# Patient Record
Sex: Female | Born: 1935
Health system: Southern US, Community
[De-identification: ages and names within clinical notes are randomized; demographics above are authoritative.]

## PROBLEM LIST (undated history)

## (undated) DIAGNOSIS — E119 Type 2 diabetes mellitus without complications: Secondary | ICD-10-CM

## (undated) DIAGNOSIS — R9431 Abnormal electrocardiogram [ECG] [EKG]: Secondary | ICD-10-CM

## (undated) DIAGNOSIS — I4891 Unspecified atrial fibrillation: Secondary | ICD-10-CM

## (undated) DIAGNOSIS — I10 Essential (primary) hypertension: Secondary | ICD-10-CM

## (undated) DIAGNOSIS — E039 Hypothyroidism, unspecified: Secondary | ICD-10-CM

## (undated) HISTORY — DX: Type 2 diabetes mellitus without complications: E11.9

## (undated) HISTORY — DX: Hypothyroidism, unspecified: E03.9

## (undated) HISTORY — DX: Essential (primary) hypertension: I10

## (undated) HISTORY — PX: CATARACT EXTRACTION: SUR2

## (undated) HISTORY — DX: Abnormal electrocardiogram (ECG) (EKG): R94.31

## (undated) HISTORY — PX: KNEE SURGERY: SHX244

## (undated) HISTORY — PX: ABDOMINAL HYSTERECTOMY: SHX81

---

## 2007-09-28 ENCOUNTER — Ambulatory Visit: Payer: Self-pay | Admitting: Gastroenterology

## 2007-12-29 ENCOUNTER — Ambulatory Visit: Payer: Self-pay | Admitting: Gastroenterology

## 2008-01-11 ENCOUNTER — Ambulatory Visit: Payer: Self-pay | Admitting: Gastroenterology

## 2008-08-14 ENCOUNTER — Emergency Department (HOSPITAL_COMMUNITY): Admission: EM | Admit: 2008-08-14 | Discharge: 2008-08-14 | Payer: Self-pay | Admitting: Emergency Medicine

## 2010-05-09 ENCOUNTER — Ambulatory Visit: Payer: Self-pay | Admitting: Cardiovascular Disease

## 2010-05-21 ENCOUNTER — Encounter: Payer: Self-pay | Admitting: Cardiovascular Disease

## 2010-05-21 ENCOUNTER — Ambulatory Visit: Payer: Self-pay

## 2010-05-21 ENCOUNTER — Ambulatory Visit (HOSPITAL_COMMUNITY)
Admission: RE | Admit: 2010-05-21 | Discharge: 2010-05-21 | Payer: Self-pay | Source: Home / Self Care | Attending: Cardiovascular Disease | Admitting: Cardiovascular Disease

## 2011-03-08 LAB — GLUCOSE, CAPILLARY
Glucose-Capillary: 127 — ABNORMAL HIGH
Glucose-Capillary: 140 — ABNORMAL HIGH

## 2011-11-09 ENCOUNTER — Encounter: Payer: Self-pay | Admitting: *Deleted

## 2012-01-16 ENCOUNTER — Encounter: Payer: Self-pay | Admitting: Gastroenterology

## 2012-02-18 ENCOUNTER — Ambulatory Visit: Payer: Self-pay | Admitting: Gastroenterology

## 2012-03-16 ENCOUNTER — Ambulatory Visit: Payer: Self-pay | Admitting: Gastroenterology

## 2012-11-25 ENCOUNTER — Encounter: Payer: Self-pay | Admitting: Gastroenterology

## 2013-11-07 ENCOUNTER — Encounter: Payer: Self-pay | Admitting: Gastroenterology

## 2015-04-03 ENCOUNTER — Encounter: Payer: Self-pay | Admitting: Gastroenterology

## 2018-11-11 ENCOUNTER — Encounter: Payer: Self-pay | Admitting: Gastroenterology

## 2020-03-02 ENCOUNTER — Other Ambulatory Visit: Payer: Self-pay

## 2020-03-02 ENCOUNTER — Other Ambulatory Visit (HOSPITAL_COMMUNITY)
Admission: RE | Admit: 2020-03-02 | Discharge: 2020-03-02 | Disposition: A | Discharge status: Home / Self Care | Payer: Medicare HMO | Source: Ambulatory Visit | Attending: Cardiology | Admitting: Cardiology

## 2020-03-02 ENCOUNTER — Encounter: Payer: Self-pay | Admitting: Cardiology

## 2020-03-02 ENCOUNTER — Ambulatory Visit (INDEPENDENT_AMBULATORY_CARE_PROVIDER_SITE_OTHER): Payer: Medicare HMO | Admitting: Cardiology

## 2020-03-02 VITALS — BP 172/90 | HR 68 | Ht 62.0 in | Wt 154.0 lb

## 2020-03-02 DIAGNOSIS — I2089 Other forms of angina pectoris: Secondary | ICD-10-CM

## 2020-03-02 DIAGNOSIS — R0609 Other forms of dyspnea: Secondary | ICD-10-CM

## 2020-03-02 DIAGNOSIS — Z20822 Contact with and (suspected) exposure to covid-19: Secondary | ICD-10-CM | POA: Diagnosis not present

## 2020-03-02 DIAGNOSIS — I10 Essential (primary) hypertension: Secondary | ICD-10-CM

## 2020-03-02 DIAGNOSIS — E785 Hyperlipidemia, unspecified: Secondary | ICD-10-CM

## 2020-03-02 DIAGNOSIS — Z01812 Encounter for preprocedural laboratory examination: Secondary | ICD-10-CM | POA: Diagnosis present

## 2020-03-02 DIAGNOSIS — Z7189 Other specified counseling: Secondary | ICD-10-CM

## 2020-03-02 DIAGNOSIS — I208 Other forms of angina pectoris: Secondary | ICD-10-CM

## 2020-03-02 DIAGNOSIS — R072 Precordial pain: Secondary | ICD-10-CM

## 2020-03-02 DIAGNOSIS — R0600 Dyspnea, unspecified: Secondary | ICD-10-CM

## 2020-03-02 DIAGNOSIS — E119 Type 2 diabetes mellitus without complications: Secondary | ICD-10-CM

## 2020-03-02 LAB — CBC
Hematocrit: 38.7 % (ref 34.0–46.6)
Hemoglobin: 12.5 g/dL (ref 11.1–15.9)
MCH: 31.3 pg (ref 26.6–33.0)
MCHC: 32.3 g/dL (ref 31.5–35.7)
MCV: 97 fL (ref 79–97)
Platelets: 234 10*3/uL (ref 150–450)
RBC: 4 x10E6/uL (ref 3.77–5.28)
RDW: 13.5 % (ref 11.7–15.4)
WBC: 9.2 10*3/uL (ref 3.4–10.8)

## 2020-03-02 LAB — BASIC METABOLIC PANEL
BUN/Creatinine Ratio: 15 (ref 12–28)
BUN: 14 mg/dL (ref 8–27)
CO2: 22 mmol/L (ref 20–29)
Calcium: 9.3 mg/dL (ref 8.7–10.3)
Chloride: 105 mmol/L (ref 96–106)
Creatinine, Ser: 0.91 mg/dL (ref 0.57–1.00)
GFR calc Af Amer: 67 mL/min/{1.73_m2} (ref >59)
GFR calc non Af Amer: 58 mL/min/{1.73_m2} — ABNORMAL LOW (ref >59)
Glucose: 149 mg/dL — ABNORMAL HIGH (ref 65–99)
Potassium: 5.3 mmol/L — ABNORMAL HIGH (ref 3.5–5.2)
Sodium: 143 mmol/L (ref 134–144)

## 2020-03-02 LAB — SARS CORONAVIRUS 2 (TAT 6-24 HRS): SARS Coronavirus 2: NEGATIVE

## 2020-03-02 MED ORDER — NITROGLYCERIN 0.4 MG SL SUBL: 0.4000 mg | SUBLINGUAL_TABLET | SUBLINGUAL | 3 refills | Status: AC | PRN

## 2020-03-02 NOTE — Patient Instructions (Addendum)
Medication Instructions:  Nitroglycerin: Place 1 tablet (0.4 mg total) under the tongue every 5 (five) minutes as needed for chest pain as directed.   *If you need a refill on your cardiac medications before your next appointment, please call your pharmacy*   Lab Work: Your physician recommends that you return for lab work today ( CBC, BMP, Lipid)  If you have labs (blood work) drawn today and your tests are completely normal, you will receive your results only by: Marland Kitchen MyChart Message (if you have MyChart) OR . A paper copy in the mail If you have any lab test that is abnormal or we need to change your treatment, we will call you to review the results.   Testing/Procedures: Your physician has requested that you have a cardiac catheterization. Cardiac catheterization is used to diagnose and/or treat various heart conditions. Doctors may recommend this procedure for a number of different reasons. The most common reason is to evaluate chest pain. Chest pain can be a symptom of coronary artery disease (CAD), and cardiac catheterization can show whether plaque is narrowing or blocking your heart's arteries. This procedure is also used to evaluate the valves, as well as measure the blood flow and oxygen levels in different parts of your heart. For further information please visit [email or web address] Please follow instruction sheet, as given. [facility]  You will need to have the coronavirus test completed prior to your procedure. An appointment has been made at 12 pm  on Thursday 03/02/20. This is a Drive Up Visit at [address] [place], [place], [place] [address]. Please tell them that you are there for procedure testing. Stay in your car and someone will be with you shortly. Please make sure to have all other labs completed before this test because you will need to stay quarantined until your procedure.   Follow-Up: At [facility], you and your health needs are our priority.  As part of  our continuing mission to provide you with exceptional heart care, we have created designated Provider Care Teams.  These Care Teams include your primary Cardiologist (physician) and Advanced Practice Providers (APPs -  Physician Assistants and Nurse Practitioners) who all work together to provide you with the care you need, when you need it.  We recommend signing up for the patient portal called "MyChart".  Sign up information is provided on this After Visit Summary.  MyChart is used to connect with patients for Virtual Visits (Telemedicine).  Patients are able to view lab/test results, encounter notes, upcoming appointments, etc.  Non-urgent messages can be sent to your provider as well.   To learn more about what you can do with MyChart, go to [email or web address].    Your next appointment:   2 week(s)  The format for your next appointment:   In Person  Provider:   Jodelle Red, MD     [facility] CARDIOVASCULAR DIVISION [facility] [address] [place] [address] [place] [address] Dept: [phone] Loc: [phone]  Fusae Florio  03/02/2020  You are scheduled for a Cardiac Catheterization on Monday, September 27 with Dr. Peter Swaziland.  1. Please arrive at the [facility] (Main Entrance A) at [facility]: [address] [place], [place] [address] at 5:30 AM (This time is two hours before your procedure to ensure your preparation). Free valet parking service is available.   Special note: Every effort is made to have your procedure done on time. Please understand that emergencies sometimes delay scheduled  procedures.  2. Diet: Do not eat solid foods after midnight.  The patient may have clear liquids until 5am upon the day of the procedure.  3. Labs: You will need to have blood drawn today ( CBC, BMP)  4. Medication instructions in preparation for your procedure:   Contrast Allergy: No  Do not take Diabetes Med  Glucophage (Metformin) on the day of the procedure and HOLD 48 HOURS AFTER THE PROCEDURE.  On the morning of your procedure, take your Aspirin and any morning medicines NOT listed above.  You may use sips of water.  5. Plan for one night stay--bring personal belongings. 6. Bring a current list of your medications and current insurance cards. 7. You MUST have a responsible person to drive you home. 8. Someone MUST be with you the first 24 hours after you arrive home or your discharge will be delayed. 9. Please wear clothes that are easy to get on and off and wear slip-on shoes.  Thank you for allowing [place] to care for you!   -- [place] [facility]

## 2020-03-02 NOTE — H&P (View-Only) (Signed)
Cardiology Office Note:    Date:  03/02/2020   ID:  Cheryl Mora, DOB 09/24/1932, MRN [id number]  PCP:  Badger, Michael C, MD  Cardiologist:  Oumar Marcott, MD  Referring MD: Badger, Michael C, MD   CC: new patient evaluation for dyspnea on exertion  History of Present Illness:    Cheryl Mora is a 87 y.o. female with a hx of type II diabetes, hyperlipidemia, hypertension, hypothyroidism who is seen as a new consult at the request of Badger, Michael C, MD for the evaluation and management of dyspnea on exertion.  Notes received and reviewed from appt with Dr. Badger on 01/24/20. Noted gradually worsening dyspnea on exertion at that visit.  Today: Here with daughter Cheryl Mora today.  Feels short of breath even walking up a short incline carrying a 5 lbs bag of potatoes. Has progressed signficantly in the last 1-2 weeks. Was short of breath while shopping as well. No nausea, diaphoresis, but does feel lightheaded with events. No chest pain until last night, felt like an elephant was sitting on her chest, felt pressure in her upper chest as well.   Walks 2-4,000 steps/day on her fitbit. Notes that in the same last 1-2 weeks her heart rate can be elevated to the 120s, now back to normal.   Gets a pain in her right shoulder and neck as well, has arthritis in this shoulder and using Salonpas.  Labs reviewed   FH: mother and maternal had heart disease. Father's side is unknown  Former smoker, very heavy until 30-40 years ago.   Already taking aspirin, atenolol, pravastatin. Tolerating these.   Denies PND, orthopnea, LE edema or unexpected weight gain. No syncope or palpitations.  Past Medical History:  Diagnosis Date  . Abnormal EKG    suggestive of previous anterior wall myocardial infarction  . Diabetes mellitus (HCC)   . Hypertension   . Hypothyroidism     Past Surgical History:  Procedure Laterality Date  . ABDOMINAL HYSTERECTOMY    . CATARACT EXTRACTION    . KNEE  SURGERY      Current Medications: Current Outpatient Medications on File Prior to Visit  Medication Sig  . amLODipine (NORVASC) 2.5 MG tablet Take 2.5 mg by mouth daily.   . aspirin 81 MG tablet Take 81 mg by mouth daily.  . atenolol (TENORMIN) 50 MG tablet Take 50 mg by mouth daily.  . diclofenac (VOLTAREN) 75 MG EC tablet Take 75 mg by mouth 2 (two) times daily.  . fluticasone (FLONASE) 50 MCG/ACT nasal spray Place 2 sprays into the nose daily.  . levothyroxine (SYNTHROID, LEVOTHROID) 88 MCG tablet Take 88 mcg by mouth daily.  . metFORMIN (GLUCOPHAGE) 500 MG tablet Take 500 mg by mouth 2 (two) times daily with a meal.  . pravastatin (PRAVACHOL) 40 MG tablet Take 40 mg by mouth daily.   No current facility-administered medications on file prior to visit.     Allergies:   Amoxicillin-pot clavulanate, Brimonidine, Lisinopril, Zolpidem tartrate, Brimonidine tartrate-timolol, Other, Zolpidem, and Zolpidem tartrate   Social History   Tobacco Use  . Smoking status: Former Smoker  . Smokeless tobacco: Never Used  Substance Use Topics  . Alcohol use: Yes    Comment: several a day  . Drug use: Not on file    Family History: family history includes Cirrhosis in her mother; Heart attack in her unknown relative; Hypertension in her mother; Stroke in her mother.  ROS:   Please see the history of present illness.    Additional pertinent ROS: Constitutional: Negative for chills, fever, night sweats, unintentional weight loss  HENT: Negative for ear pain and hearing loss.   Eyes: Negative for loss of vision and eye pain.  Respiratory: Negative for cough, sputum, wheezing.   Cardiovascular: See HPI. Gastrointestinal: Negative for abdominal pain, melena, and hematochezia.  Genitourinary: Negative for dysuria and hematuria.  Musculoskeletal: Negative for falls and myalgias.  Skin: Negative for itching and rash.  Neurological: Negative for focal weakness, focal sensory changes and loss of  consciousness.  Endo/Heme/Allergies: Does not bruise/bleed easily.     EKGs/Labs/Other Studies Reviewed:    The following studies were reviewed today: No prior cardiac studies.  EKG:  EKG is personally reviewed.  The ekg ordered today demonstrates SR at 68 bpm, PACs, PRWP, TWI V1-V4  Recent Labs: No results found for requested labs within last 8760 hours.  Recent Lipid Panel No results found for: CHOL, TRIG, HDL, CHOLHDL, VLDL, LDLCALC, LDLDIRECT  Physical Exam:    VS:  BP (!) 172/90   Pulse 68   Ht 5\' 2"  (1.575 m)   Wt 154 lb (69.9 kg)   BMI 28.17 kg/m     Wt Readings from Last 3 Encounters:  03/02/20 154 lb (69.9 kg)    GEN: Well nourished, well developed in no acute distress HEENT: Normal, moist mucous membranes NECK: No JVD CARDIAC: regular rhythm, normal S1 and S2, no rubs or gallops. No murmurs. VASCULAR: Radial and DP pulses 2+ bilaterally. No carotid bruits RESPIRATORY:  Clear to auscultation without rales, wheezing or rhonchi  ABDOMEN: Soft, non-tender, non-distended MUSCULOSKELETAL:  Ambulates independently SKIN: Warm and dry, no edema NEUROLOGIC:  Alert and oriented x 3. No focal neuro deficits noted. PSYCHIATRIC:  Normal affect    ASSESSMENT:    1. Precordial pain   2. Stable angina pectoris (HCC)   3. Pre-procedure lab exam   4. Dyspnea on exertion   5. Hyperlipidemia, unspecified hyperlipidemia type   6. Essential hypertension   7. Type 2 diabetes mellitus without complication, without long-term current use of insulin (HCC)   8. Cardiac risk counseling   9. Counseling on health promotion and disease prevention    PLAN:    Dyspnea on exertion, consistent with angina pectoris: this has only been with exertion, though does report recent worsening. Also reports single episode of chest pain at rest yesterday. Concerning that this may be moving to unstable angina -we discussed CAD at length today, including reviewing diagrams and natural history -we  discussed unstable symptoms at length. Discussed when to call 911 -on aspirin, beta blocker, statin. Would like to intensify statin but will further evaluate first -with her history of hyperlipidemia, type II diabetes, and hypertension, she is high risk for ischemic etiology -ECG also with poor R wave progression and TWI V1-V4. Only other ECG from 2011 showed similar pattern but TWI did not extend to V3-V4 -given her risk factors, high suspicion, will proceed directly to cath -her husband was cared for by Dr. 2012, he is available on first available cath date, will request -Risks and benefits of cardiac catheterization have been discussed with the patient.  These include bleeding, infection, kidney damage, stroke, heart attack, death.  The patient understands these risks and is willing to proceed. -labs today, including CBC, BMET, lipids -counseled on 324 mg aspirin and SL NG PRN. Instructed on use, when to call 911 -prescription for nitro sent -continue aspirin 81 mg daily and beta blocker  Hyperlipidemia: -labs today -on pravastatin, would like  to intensify but will get cath first  Hypertension: -elevated today, but she is very stressed -for now, will continue amlodipine, atenolol  Type II diabetes: -no labs available to me -on metformin only -if CAD found, consider SGLT2i or GLP1RA  Cardiac risk counseling and prevention recommendations: -recommend heart healthy/Mediterranean diet, with whole grains, fruits, vegetable, fish, lean meats, nuts, and olive oil. Limit salt. -recommend moderate walking, 3-5 times/week for 30-50 minutes each session. Aim for at least 150 minutes.week. Goal should be pace of 3 miles/hours, or walking 1.5 miles in 30 minutes -recommend avoidance of tobacco products. Avoid excess alcohol. -Additional risk factor control:  -Weight: BMI 28 -ASCVD risk score: The ASCVD Risk score Denman George DC Jr., et al., 2013) failed to calculate for the following reasons:    The 2013 ASCVD risk score is only valid for ages 21 to 62    Plan for follow up: cath, then follow up in 2 weeks.  High risk medical condition, extensive counseling done, concern that this may be early unstable angina. Educated at [employer] re: when to call 911 and get immediate medical attention.  Cheryl Red, MD, PhD [place]  [facility]    Medication Adjustments/Labs and Tests Ordered: Current medicines are reviewed at length with the patient today.  Concerns regarding medicines are outlined above.  Orders Placed This Encounter  Procedures  . CBC  . Basic metabolic panel  . EKG 12-Lead   Meds ordered this encounter  Medications  . nitroGLYCERIN (NITROSTAT) 0.4 MG SL tablet    Sig: Place 1 tablet (0.4 mg total) under the tongue every 5 (five) minutes as needed for chest pain.    Dispense:  90 tablet    Refill:  3    Patient Instructions  Medication Instructions:  Nitroglycerin: Place 1 tablet (0.4 mg total) under the tongue every 5 (five) minutes as needed for chest pain as directed.   *If you need a refill on your cardiac medications before your next appointment, please call your pharmacy*   Lab Work: Your physician recommends that you return for lab work today ( CBC, BMP, Lipid)  If you have labs (blood work) drawn today and your tests are completely normal, you will receive your results only by: Marland Kitchen MyChart Message (if you have MyChart) OR . A paper copy in the mail If you have any lab test that is abnormal or we need to change your treatment, we will call you to review the results.   Testing/Procedures: Your physician has requested that you have a cardiac catheterization. Cardiac catheterization is used to diagnose and/or treat various heart conditions. Doctors may recommend this procedure for a number of different reasons. The most common reason is to evaluate chest pain. Chest pain can be a symptom of coronary artery disease (CAD), and cardiac  catheterization can show whether plaque is narrowing or blocking your heart's arteries. This procedure is also used to evaluate the valves, as well as measure the blood flow and oxygen levels in different parts of your heart. For further information please visit [email or web address] Please follow instruction sheet, as given. [facility]  You will need to have the coronavirus test completed prior to your procedure. An appointment has been made at 12 pm  on Thursday 03/02/20. This is a Drive Up Visit at [address] [place], [place], [place] [address]. Please tell them that you are there for procedure testing. Stay in your car and someone will be with you shortly. Please make sure to have  all other labs completed before this test because you will need to stay quarantined until your procedure.   Follow-Up: At [facility], you and your health needs are our priority.  As part of our continuing mission to provide you with exceptional heart care, we have created designated Provider Care Teams.  These Care Teams include your primary Cardiologist (physician) and Advanced Practice Providers (APPs -  Physician Assistants and Nurse Practitioners) who all work together to provide you with the care you need, when you need it.  We recommend signing up for the patient portal called "MyChart".  Sign up information is provided on this After Visit Summary.  MyChart is used to connect with patients for Virtual Visits (Telemedicine).  Patients are able to view lab/test results, encounter notes, upcoming appointments, etc.  Non-urgent messages can be sent to your provider as well.   To learn more about what you can do with MyChart, go to [email or web address].    Your next appointment:   2 week(s)  The format for your next appointment:   In Person  Provider:   Jodelle Red, MD     [facility] CARDIOVASCULAR DIVISION [facility] [address] [place]  [address] [place] [address] Dept: [phone] Loc: [phone]  Lamisha Roussell  03/02/2020  You are scheduled for a Cardiac Catheterization on Monday, September 27 with Dr. Peter Swaziland.  1. Please arrive at the [facility] (Main Entrance A) at [facility]: [address] [place], [place] [address] at 5:30 AM (This time is two hours before your procedure to ensure your preparation). Free valet parking service is available.   Special note: Every effort is made to have your procedure done on time. Please understand that emergencies sometimes delay scheduled procedures.  2. Diet: Do not eat solid foods after midnight.  The patient may have clear liquids until 5am upon the day of the procedure.  3. Labs: You will need to have blood drawn today ( CBC, BMP)  4. Medication instructions in preparation for your procedure:   Contrast Allergy: No  Do not take Diabetes Med Glucophage (Metformin) on the day of the procedure and HOLD 48 HOURS AFTER THE PROCEDURE.  On the morning of your procedure, take your Aspirin and any morning medicines NOT listed above.  You may use sips of water.  5. Plan for one night stay--bring personal belongings. 6. Bring a current list of your medications and current insurance cards. 7. You MUST have a responsible person to drive you home. 8. Someone MUST be with you the first 24 hours after you arrive home or your discharge will be delayed. 9. Please wear clothes that are easy to get on and off and wear slip-on shoes.  Thank you for allowing [place] to care for you!   -- [place] [facility]    Signed, Cheryl Red, MD PhD 03/02/2020 1:16 PM    [facility]

## 2020-03-02 NOTE — Progress Notes (Signed)
Cardiology Office Note:    Date:  03/02/2020   ID:  Cheryl Mora, DOB 04/18/1933, MRN [id number]  PCP:  Eartha Inch, MD  Cardiologist:  Jodelle Red, MD  Referring MD: Eartha Inch, MD   CC: new patient evaluation for dyspnea on exertion  History of Present Illness:    Cheryl Mora is a 86 y.o. female with a hx of type II diabetes, hyperlipidemia, hypertension, hypothyroidism who is seen as a new consult at the request of Eartha Inch, MD for the evaluation and management of dyspnea on exertion.  Notes received and reviewed from appt with Dr. Cyndia Bent on 01/24/20. Noted gradually worsening dyspnea on exertion at that visit.  Today: Here with daughter Cheryl Mora today.  Feels short of breath even walking up a short incline carrying a 5 lbs bag of potatoes. Has progressed signficantly in the last 1-2 weeks. Was short of breath while shopping as well. No nausea, diaphoresis, but does feel lightheaded with events. No chest pain until last night, felt like an elephant was sitting on her chest, felt pressure in her upper chest as well.   Walks 2-4,000 steps/day on her fitbit. Notes that in the same last 1-2 weeks her heart rate can be elevated to the 120s, now back to normal.   Gets a pain in her right shoulder and neck as well, has arthritis in this shoulder and using Salonpas.  Labs reviewed   FH: mother and maternal had heart disease. Father's side is unknown  Former smoker, very heavy until 30-40 years ago.   Already taking aspirin, atenolol, pravastatin. Tolerating these.   Denies PND, orthopnea, LE edema or unexpected weight gain. No syncope or palpitations.  Past Medical History:  Diagnosis Date  . Abnormal EKG    suggestive of previous anterior wall myocardial infarction  . Diabetes mellitus (HCC)   . Hypertension   . Hypothyroidism     Past Surgical History:  Procedure Laterality Date  . ABDOMINAL HYSTERECTOMY    . CATARACT EXTRACTION    . KNEE  SURGERY      Current Medications: Current Outpatient Medications on File Prior to Visit  Medication Sig  . amLODipine (NORVASC) 2.5 MG tablet Take 2.5 mg by mouth daily.   Marland Kitchen aspirin 81 MG tablet Take 81 mg by mouth daily.  Marland Kitchen atenolol (TENORMIN) 50 MG tablet Take 50 mg by mouth daily.  . diclofenac (VOLTAREN) 75 MG EC tablet Take 75 mg by mouth 2 (two) times daily.  . fluticasone (FLONASE) 50 MCG/ACT nasal spray Place 2 sprays into the nose daily.  Marland Kitchen levothyroxine (SYNTHROID, LEVOTHROID) 88 MCG tablet Take 88 mcg by mouth daily.  . metFORMIN (GLUCOPHAGE) 500 MG tablet Take 500 mg by mouth 2 (two) times daily with a meal.  . pravastatin (PRAVACHOL) 40 MG tablet Take 40 mg by mouth daily.   No current facility-administered medications on file prior to visit.     Allergies:   Amoxicillin-pot clavulanate, Brimonidine, Lisinopril, Zolpidem tartrate, Brimonidine tartrate-timolol, Other, Zolpidem, and Zolpidem tartrate   Social History   Tobacco Use  . Smoking status: Former Games developer  . Smokeless tobacco: Never Used  Substance Use Topics  . Alcohol use: Yes    Comment: several a day  . Drug use: Not on file    Family History: family history includes Cirrhosis in her mother; Heart attack in her unknown relative; Hypertension in her mother; Stroke in her mother.  ROS:   Please see the history of present illness.  Additional pertinent ROS: Constitutional: Negative for chills, fever, night sweats, unintentional weight loss  HENT: Negative for ear pain and hearing loss.   Eyes: Negative for loss of vision and eye pain.  Respiratory: Negative for cough, sputum, wheezing.   Cardiovascular: See HPI. Gastrointestinal: Negative for abdominal pain, melena, and hematochezia.  Genitourinary: Negative for dysuria and hematuria.  Musculoskeletal: Negative for falls and myalgias.  Skin: Negative for itching and rash.  Neurological: Negative for focal weakness, focal sensory changes and loss of  consciousness.  Endo/Heme/Allergies: Does not bruise/bleed easily.     EKGs/Labs/Other Studies Reviewed:    The following studies were reviewed today: No prior cardiac studies.  EKG:  EKG is personally reviewed.  The ekg ordered today demonstrates SR at 68 bpm, PACs, PRWP, TWI V1-V4  Recent Labs: No results found for requested labs within last 8760 hours.  Recent Lipid Panel No results found for: CHOL, TRIG, HDL, CHOLHDL, VLDL, LDLCALC, LDLDIRECT  Physical Exam:    VS:  BP (!) 172/90   Pulse 68   Ht 5\' 2"  (1.575 m)   Wt 154 lb (69.9 kg)   BMI 28.17 kg/m     Wt Readings from Last 3 Encounters:  03/02/20 154 lb (69.9 kg)    GEN: Well nourished, well developed in no acute distress HEENT: Normal, moist mucous membranes NECK: No JVD CARDIAC: regular rhythm, normal S1 and S2, no rubs or gallops. No murmurs. VASCULAR: Radial and DP pulses 2+ bilaterally. No carotid bruits RESPIRATORY:  Clear to auscultation without rales, wheezing or rhonchi  ABDOMEN: Soft, non-tender, non-distended MUSCULOSKELETAL:  Ambulates independently SKIN: Warm and dry, no edema NEUROLOGIC:  Alert and oriented x 3. No focal neuro deficits noted. PSYCHIATRIC:  Normal affect    ASSESSMENT:    1. Precordial pain   2. Stable angina pectoris (HCC)   3. Pre-procedure lab exam   4. Dyspnea on exertion   5. Hyperlipidemia, unspecified hyperlipidemia type   6. Essential hypertension   7. Type 2 diabetes mellitus without complication, without long-term current use of insulin (HCC)   8. Cardiac risk counseling   9. Counseling on health promotion and disease prevention    PLAN:    Dyspnea on exertion, consistent with angina pectoris: this has only been with exertion, though does report recent worsening. Also reports single episode of chest pain at rest yesterday. Concerning that this may be moving to unstable angina -we discussed CAD at length today, including reviewing diagrams and natural history -we  discussed unstable symptoms at length. Discussed when to call 911 -on aspirin, beta blocker, statin. Would like to intensify statin but will further evaluate first -with her history of hyperlipidemia, type II diabetes, and hypertension, she is high risk for ischemic etiology -ECG also with poor R wave progression and TWI V1-V4. Only other ECG from 2011 showed similar pattern but TWI did not extend to V3-V4 -given her risk factors, high suspicion, will proceed directly to cath -her husband was cared for by Dr. 2012, he is available on first available cath date, will request -Risks and benefits of cardiac catheterization have been discussed with the patient.  These include bleeding, infection, kidney damage, stroke, heart attack, death.  The patient understands these risks and is willing to proceed. -labs today, including CBC, BMET, lipids -counseled on 324 mg aspirin and SL NG PRN. Instructed on use, when to call 911 -prescription for nitro sent -continue aspirin 81 mg daily and beta blocker  Hyperlipidemia: -labs today -on pravastatin, would like  to intensify but will get cath first  Hypertension: -elevated today, but she is very stressed -for now, will continue amlodipine, atenolol  Type II diabetes: -no labs available to me -on metformin only -if CAD found, consider SGLT2i or GLP1RA  Cardiac risk counseling and prevention recommendations: -recommend heart healthy/Mediterranean diet, with whole grains, fruits, vegetable, fish, lean meats, nuts, and olive oil. Limit salt. -recommend moderate walking, 3-5 times/week for 30-50 minutes each session. Aim for at least 150 minutes.week. Goal should be pace of 3 miles/hours, or walking 1.5 miles in 30 minutes -recommend avoidance of tobacco products. Avoid excess alcohol. -Additional risk factor control:  -Weight: BMI 28 -ASCVD risk score: The ASCVD Risk score Denman George DC Jr., et al., 2013) failed to calculate for the following reasons:    The 2013 ASCVD risk score is only valid for ages 21 to 62    Plan for follow up: cath, then follow up in 2 weeks.  High risk medical condition, extensive counseling done, concern that this may be early unstable angina. Educated at [employer] re: when to call 911 and get immediate medical attention.  Jodelle Red, MD, PhD [place]  [facility]    Medication Adjustments/Labs and Tests Ordered: Current medicines are reviewed at length with the patient today.  Concerns regarding medicines are outlined above.  Orders Placed This Encounter  Procedures  . CBC  . Basic metabolic panel  . EKG 12-Lead   Meds ordered this encounter  Medications  . nitroGLYCERIN (NITROSTAT) 0.4 MG SL tablet    Sig: Place 1 tablet (0.4 mg total) under the tongue every 5 (five) minutes as needed for chest pain.    Dispense:  90 tablet    Refill:  3    Patient Instructions  Medication Instructions:  Nitroglycerin: Place 1 tablet (0.4 mg total) under the tongue every 5 (five) minutes as needed for chest pain as directed.   *If you need a refill on your cardiac medications before your next appointment, please call your pharmacy*   Lab Work: Your physician recommends that you return for lab work today ( CBC, BMP, Lipid)  If you have labs (blood work) drawn today and your tests are completely normal, you will receive your results only by: Marland Kitchen MyChart Message (if you have MyChart) OR . A paper copy in the mail If you have any lab test that is abnormal or we need to change your treatment, we will call you to review the results.   Testing/Procedures: Your physician has requested that you have a cardiac catheterization. Cardiac catheterization is used to diagnose and/or treat various heart conditions. Doctors may recommend this procedure for a number of different reasons. The most common reason is to evaluate chest pain. Chest pain can be a symptom of coronary artery disease (CAD), and cardiac  catheterization can show whether plaque is narrowing or blocking your heart's arteries. This procedure is also used to evaluate the valves, as well as measure the blood flow and oxygen levels in different parts of your heart. For further information please visit [email or web address] Please follow instruction sheet, as given. [facility]  You will need to have the coronavirus test completed prior to your procedure. An appointment has been made at 12 pm  on Thursday 03/02/20. This is a Drive Up Visit at [address], [place], [place] [address]. Please tell them that you are there for procedure testing. Stay in your car and someone will be with you shortly. Please make sure to have  all other labs completed before this test because you will need to stay quarantined until your procedure.   Follow-Up: At [facility], you and your health needs are our priority.  As part of our continuing mission to provide you with exceptional heart care, we have created designated Provider Care Teams.  These Care Teams include your primary Cardiologist (physician) and Advanced Practice Providers (APPs -  Physician Assistants and Nurse Practitioners) who all work together to provide you with the care you need, when you need it.  We recommend signing up for the patient portal called "MyChart".  Sign up information is provided on this After Visit Summary.  MyChart is used to connect with patients for Virtual Visits (Telemedicine).  Patients are able to view lab/test results, encounter notes, upcoming appointments, etc.  Non-urgent messages can be sent to your provider as well.   To learn more about what you can do with MyChart, go to [email or web address].    Your next appointment:   2 week(s)  The format for your next appointment:   In Person  Provider:   Jodelle Red, MD     [facility] CARDIOVASCULAR DIVISION [facility] [address] [place]  [address] [place] [address] Dept: [phone] Loc: [phone]  Cheryl Mora  03/02/2020  You are scheduled for a Cardiac Catheterization on Monday, September 27 with Dr. Peter [place].  1. Please arrive at the [facility] (Main Entrance A) at [facility]: [address] [place], [place] [address] at 5:30 AM (This time is two hours before your procedure to ensure your preparation). Free valet parking service is available.   Special note: Every effort is made to have your procedure done on time. Please understand that emergencies sometimes delay scheduled procedures.  2. Diet: Do not eat solid foods after midnight.  The patient may have clear liquids until 5am upon the day of the procedure.  3. Labs: You will need to have blood drawn today ( CBC, BMP)  4. Medication instructions in preparation for your procedure:   Contrast Allergy: No  Do not take Diabetes Med Glucophage (Metformin) on the day of the procedure and HOLD 48 HOURS AFTER THE PROCEDURE.  On the morning of your procedure, take your Aspirin and any morning medicines NOT listed above.  You may use sips of water.  5. Plan for one night stay--bring personal belongings. 6. Bring a current list of your medications and current insurance cards. 7. You MUST have a responsible person to drive you home. 8. Someone MUST be with you the first 24 hours after you arrive home or your discharge will be delayed. 9. Please wear clothes that are easy to get on and off and wear slip-on shoes.  Thank you for allowing [place] to care for you!   -- [facility]    Signed, Jodelle Red, MD PhD 03/02/2020 1:16 PM    [facility]

## 2020-03-05 ENCOUNTER — Encounter (HOSPITAL_COMMUNITY): Payer: Self-pay | Admitting: Emergency Medicine

## 2020-03-05 ENCOUNTER — Emergency Department (HOSPITAL_COMMUNITY)
Admission: EM | Admit: 2020-03-05 | Discharge: 2020-03-05 | Disposition: A | Discharge status: Home / Self Care | Payer: Medicare HMO | Source: Home / Self Care | Attending: Emergency Medicine | Admitting: Emergency Medicine

## 2020-03-05 ENCOUNTER — Other Ambulatory Visit: Payer: Self-pay

## 2020-03-05 ENCOUNTER — Emergency Department (HOSPITAL_COMMUNITY): Payer: Medicare HMO

## 2020-03-05 DIAGNOSIS — E119 Type 2 diabetes mellitus without complications: Secondary | ICD-10-CM | POA: Insufficient documentation

## 2020-03-05 DIAGNOSIS — M5412 Radiculopathy, cervical region: Secondary | ICD-10-CM

## 2020-03-05 DIAGNOSIS — I10 Essential (primary) hypertension: Secondary | ICD-10-CM | POA: Insufficient documentation

## 2020-03-05 DIAGNOSIS — E039 Hypothyroidism, unspecified: Secondary | ICD-10-CM | POA: Insufficient documentation

## 2020-03-05 DIAGNOSIS — Z7982 Long term (current) use of aspirin: Secondary | ICD-10-CM | POA: Insufficient documentation

## 2020-03-05 DIAGNOSIS — Z87891 Personal history of nicotine dependence: Secondary | ICD-10-CM | POA: Insufficient documentation

## 2020-03-05 DIAGNOSIS — I4891 Unspecified atrial fibrillation: Secondary | ICD-10-CM | POA: Diagnosis not present

## 2020-03-05 DIAGNOSIS — I480 Paroxysmal atrial fibrillation: Secondary | ICD-10-CM | POA: Diagnosis not present

## 2020-03-05 DIAGNOSIS — Z7984 Long term (current) use of oral hypoglycemic drugs: Secondary | ICD-10-CM | POA: Insufficient documentation

## 2020-03-05 DIAGNOSIS — Z79890 Hormone replacement therapy (postmenopausal): Secondary | ICD-10-CM | POA: Insufficient documentation

## 2020-03-05 LAB — CBC
HCT: 41.1 % (ref 36.0–46.0)
Hemoglobin: 12.9 g/dL (ref 12.0–15.0)
MCH: 31.2 pg (ref 26.0–34.0)
MCHC: 31.4 g/dL (ref 30.0–36.0)
MCV: 99.3 fL (ref 80.0–100.0)
Platelets: 252 10*3/uL (ref 150–400)
RBC: 4.14 MIL/uL (ref 3.87–5.11)
RDW: 14.7 % (ref 11.5–15.5)
WBC: 7.7 10*3/uL (ref 4.0–10.5)
nRBC: 0 % (ref 0.0–0.2)

## 2020-03-05 LAB — BASIC METABOLIC PANEL
Anion gap: 15 (ref 5–15)
BUN: 15 mg/dL (ref 8–23)
CO2: 23 mmol/L (ref 22–32)
Calcium: 8.4 mg/dL — ABNORMAL LOW (ref 8.9–10.3)
Chloride: 102 mmol/L (ref 98–111)
Creatinine, Ser: 0.95 mg/dL (ref 0.44–1.00)
GFR calc Af Amer: >60 mL/min (ref >60)
GFR calc non Af Amer: 55 mL/min — ABNORMAL LOW (ref >60)
Glucose, Bld: 165 mg/dL — ABNORMAL HIGH (ref 70–99)
Potassium: 3.9 mmol/L (ref 3.5–5.1)
Sodium: 140 mmol/L (ref 135–145)

## 2020-03-05 LAB — TROPONIN I (HIGH SENSITIVITY)
Troponin I (High Sensitivity): 22 ng/L — ABNORMAL HIGH (ref <18)
Troponin I (High Sensitivity): 22 ng/L — ABNORMAL HIGH (ref <18)

## 2020-03-05 MED ORDER — LIDOCAINE 5 % EX PTCH: 1.0000 | MEDICATED_PATCH | CUTANEOUS | Status: DC

## 2020-03-05 NOTE — Discharge Instructions (Addendum)
Warm compress to left shoulder for 20 minutes followed by gentle stretching.  Return to the ER for chest pain or other concerning symptoms. Heart cath scheduled for tomorrow. Consider Melatonin tonight before bed to help with sleep.  Follow up with your PCP for recheck left arm pain.

## 2020-03-05 NOTE — ED Notes (Signed)
PT ambulated in room. 02 sat remained at 96%, but PT became short of breath with increase exertion ( RR 25-32)

## 2020-03-05 NOTE — ED Triage Notes (Signed)
Pt to triage via [facility] from home.  Reports tingling/shooting pain to L arm and SOB that started this morning.  Took ASA prior to EMS arrival.  18g R AC.

## 2020-03-05 NOTE — ED Provider Notes (Signed)
[facility] EMERGENCY DEPARTMENT Provider Note   CSN: [id number] Arrival date & time: 03/05/20  7829     History Chief Complaint  Patient presents with  . L arm tingling    Cheryl Mora is a 86 y.o. female.  86 year old female with history of DM, HTN, hyperlipidemia, scheduled to heart cath tomorrow, brought in by EMS for left arm discomfort. Patient states she woke up feeling fine this morning, walked to the bathroom to brush her hair and go to the bathroom and noticed a deep aching pain in her left shoulder with tingling in all of her fingers.  Patient called EMS was brought to the hospital, was placed in the lobby where her symptoms completely resolved while waiting.  Patient got up and ambulated to the bathroom and her symptoms returned.  Patient was brought to her room in the emergency room was completely resolved again while resting.  Patient states that she did not sleep well last night in anticipation of her heart cath scheduled for tomorrow.  She denies chest pain or shortness of breath today.  Patient states that at this time her left hand feels cool otherwise shoulder ache and tingling have resolved.  No other complaints or concerns today.        Past Medical History:  Diagnosis Date  . Abnormal EKG    suggestive of previous anterior wall myocardial infarction  . Diabetes mellitus (HCC)   . Hypertension   . Hypothyroidism     There are no problems to display for this patient.   Past Surgical History:  Procedure Laterality Date  . ABDOMINAL HYSTERECTOMY    . CATARACT EXTRACTION    . KNEE SURGERY       OB History   No obstetric history on file.     Family History  Problem Relation Age of Onset  . Hypertension Mother   . Stroke Mother   . Cirrhosis Mother   . Heart attack Other     Social History   Tobacco Use  . Smoking status: Former Games developer  . Smokeless tobacco: Never Used  Substance Use Topics  . Alcohol use: Yes    Comment:  several a day  . Drug use: Not on file    Home Medications Prior to Admission medications   Medication Sig Start Date End Date Taking? Authorizing Provider  amLODipine (NORVASC) 2.5 MG tablet Take 2.5 mg by mouth daily.     [provider]  aspirin 81 MG tablet Take 81 mg by mouth at bedtime.     [provider]  atenolol (TENORMIN) 50 MG tablet Take 50 mg by mouth daily.    [provider]  bismuth subsalicylate (PEPTO BISMOL) 262 MG chewable tablet Chew 132 mg by mouth as needed for indigestion.    [provider]  calcium carbonate (TUMS - DOSED IN MG ELEMENTAL CALCIUM) 500 MG chewable tablet Chew 1-2 tablets by mouth daily as needed for indigestion or heartburn.    [provider]  cetirizine (ZYRTEC) 10 MG tablet Take 10 mg by mouth daily as needed for allergies.    [provider]  diphenhydramine-acetaminophen (TYLENOL PM) 25-500 MG TABS tablet Take 1 tablet by mouth at bedtime as needed (sleep).    [provider]  dorzolamide-timolol (COSOPT) 22.3-6.8 MG/ML ophthalmic solution Place 1 drop into the left eye 2 (two) times daily.    [provider]  fluticasone (FLONASE) 50 MCG/ACT nasal spray Place 1 spray into the  nose 2 (two) times daily as needed for allergies.     [provider]  levothyroxine (SYNTHROID, LEVOTHROID) 88 MCG tablet Take 88 mcg by mouth daily.    [provider]  Melatonin 10 MG CAPS Take 10 mg by mouth at bedtime as needed (sleep).    [provider]  Menthol-Methyl Salicylate (SALONPAS PAIN RELIEF PATCH EX) Apply 1 application topically daily as needed (pain).    [provider]  Menthol-Methyl Salicylate (SALONPAS PAIN RELIEF PATCH) PTCH Apply 1 patch topically daily as needed (pain).    [provider]  metFORMIN (GLUCOPHAGE-XR) 500 MG 24 hr tablet Take 500 mg by mouth 2 (two) times daily.    [provider]  nitroGLYCERIN (NITROSTAT) 0.4  MG SL tablet Place 1 tablet (0.4 mg total) under the tongue every 5 (five) minutes as needed for chest pain. 03/02/20 05/31/20  Jodelle Red, MD  Polyethyl Glycol-Propyl Glycol (SYSTANE OP) Place 1 drop into both eyes daily as needed (dryness).    [provider]  pravastatin (PRAVACHOL) 40 MG tablet Take 40 mg by mouth at bedtime.     [provider]  Skin Protectants, Misc. (PETROLEUM JELLY EX) Apply 1 application topically 2 (two) times daily. Apply to wound    [provider]    Allergies    Amoxicillin-pot clavulanate, Brimonidine, Lisinopril, Brimonidine tartrate-timolol, and Zolpidem  Review of Systems   Review of Systems  Constitutional: Negative for fever.  Eyes: Negative for visual disturbance.  Respiratory: Negative for shortness of breath.   Cardiovascular: Negative for chest pain.  Musculoskeletal: Positive for arthralgias. Negative for neck pain and neck stiffness.  Skin: Negative for rash and wound.  Allergic/Immunologic: Positive for immunocompromised state.  Neurological: Positive for numbness. Negative for speech difficulty and weakness.  All other systems reviewed and are negative.   Physical Exam Updated Vital Signs BP 92/61   Pulse 87   Temp 98.6 F (37 C) (Oral)   Resp 18   Ht 5\' 2"  (1.575 m)   Wt 68 kg   SpO2 95%   BMI 27.44 kg/m   Physical Exam Vitals and nursing note reviewed.  Constitutional:      General: She is not in acute distress.    Appearance: She is well-developed. She is not diaphoretic.  HENT:     Head: Normocephalic and atraumatic.  Neck:   Cardiovascular:     Rate and Rhythm: Normal rate and regular rhythm.     Heart sounds: Normal heart sounds.  Pulmonary:     Effort: Pulmonary effort is normal.     Breath sounds: Normal breath sounds.  Abdominal:     Palpations: Abdomen is soft.     Tenderness: There is no abdominal tenderness.  Musculoskeletal:        General: No swelling.     Cervical  back: Tenderness present.  Skin:    General: Skin is warm and dry.     Findings: No erythema or rash.  Neurological:     Mental Status: She is alert and oriented to person, place, and time.     Cranial Nerves: No cranial nerve deficit.     Motor: No weakness.  Psychiatric:        Behavior: Behavior normal.     ED Results / Procedures / Treatments   Labs (all labs ordered are listed, but only abnormal results are displayed) Labs Reviewed  BASIC METABOLIC PANEL - Abnormal; Notable for the following components:  Result Value   Glucose, Bld 165 (*)    Calcium 8.4 (*)    GFR calc non Af Amer 55 (*)    All other components within normal limits  TROPONIN I (HIGH SENSITIVITY) - Abnormal; Notable for the following components:   Troponin I (High Sensitivity) 22 (*)    All other components within normal limits  TROPONIN I (HIGH SENSITIVITY) - Abnormal; Notable for the following components:   Troponin I (High Sensitivity) 22 (*)    All other components within normal limits  CBC    EKG EKG Interpretation  Date/Time:  Sunday March 05 2020 08:55:17 EDT Ventricular Rate:  76 PR Interval:  182 QRS Duration: 80 QT Interval:  424 QTC Calculation: 477 R Axis:   -79 Text Interpretation: Normal sinus rhythm Left anterior fascicular block Anteroseptal infarct , age undetermined Abnormal ECG Confirmed by Geoffery Lyons ([id number]) on 03/05/2020 10:46:56 AM   Radiology DG Chest 2 View  Result Date: 03/05/2020 CLINICAL DATA:  Acute shortness of breath. EXAM: CHEST - 2 VIEW COMPARISON:  None. FINDINGS: The cardiomediastinal silhouette is unremarkable. Patchy opacities within the lingula and LEFT LOWER lobe may represent pneumonia. There may be a trace LEFT pleural effusion present. Probable COPD/emphysema changes noted. There is no evidence of pneumothorax. IMPRESSION: 1. Patchy opacities within the lingula and LEFT LOWER lobe suspicious for infection/pneumonia. Possible trace LEFT pleural  effusion. Radiographic follow-up to resolution is recommended. 2. Probable COPD/emphysema. Electronically Signed   By: Harmon Pier M.D.   On: 03/05/2020 09:44    Procedures Procedures (including critical care time)  Medications Ordered in ED Medications  lidocaine (LIDODERM) 5 % 1 patch (has no administration in time range)    ED Course  I have reviewed the triage vital signs and the nursing notes.  Pertinent labs & imaging results that were available during my care of the patient were reviewed by me and considered in my medical decision making (see chart for details).  Clinical Course as of Mar 05 1349  Sun Mar 05, 2020  5841 86 year old female with complaint of left arm pain with tingling in her hand. At time of exam, symptoms had resolved. Dorsum of the left hand was cool to the touch, left radial pulse not well palpated. Pain with palpation of left trapezius area. Suspect cervical radiculopathy. Denies CP or SHOB, scheduled for heart cath tomorrow. Trop today is 22, repeat unchanged. EKG reviewed with Dr. Judd Lien, ER attending. CBC and BMP without significant findings.  Bedside doppler used, strong radial pulse present. Patient will be given Lidoderm patch, recommend warm compresses and gentle stretching. Return to ER for chest pain or SHOB.    [LM]    Clinical Course User Index [LM] Alden Hipp   MDM Rules/Calculators/A&P                          Final Clinical Impression(s) / ED Diagnoses Final diagnoses:  Cervical radiculopathy    Rx / DC Orders ED Discharge Orders    None       Jeannie Fend, PA-C 03/05/20 1350    Geoffery Lyons, MD 03/08/20 1338

## 2020-03-05 NOTE — ED Notes (Signed)
Patient verbalizes understanding of discharge instructions. Opportunity for questioning and answers were provided. Arm band removed by staff, patient discharged from ED. 

## 2020-03-06 ENCOUNTER — Encounter (HOSPITAL_COMMUNITY): Payer: Self-pay | Admitting: Cardiology

## 2020-03-06 ENCOUNTER — Encounter (HOSPITAL_COMMUNITY)
Admission: RE | Disposition: A | Discharge status: Home / Self Care | Payer: Self-pay | Source: Home / Self Care | Attending: Cardiology

## 2020-03-06 ENCOUNTER — Inpatient Hospital Stay (HOSPITAL_COMMUNITY)
Admission: RE | Admit: 2020-03-06 | Discharge: 2020-03-07 | DRG: 286 | Disposition: A | Discharge status: Home / Self Care | Payer: Medicare HMO | Attending: Cardiology | Admitting: Cardiology

## 2020-03-06 ENCOUNTER — Inpatient Hospital Stay (HOSPITAL_COMMUNITY): Payer: Medicare HMO

## 2020-03-06 ENCOUNTER — Other Ambulatory Visit: Payer: Self-pay

## 2020-03-06 DIAGNOSIS — I4891 Unspecified atrial fibrillation: Secondary | ICD-10-CM | POA: Diagnosis not present

## 2020-03-06 DIAGNOSIS — E785 Hyperlipidemia, unspecified: Secondary | ICD-10-CM | POA: Diagnosis present

## 2020-03-06 DIAGNOSIS — I428 Other cardiomyopathies: Secondary | ICD-10-CM | POA: Diagnosis present

## 2020-03-06 DIAGNOSIS — I340 Nonrheumatic mitral (valve) insufficiency: Secondary | ICD-10-CM

## 2020-03-06 DIAGNOSIS — I361 Nonrheumatic tricuspid (valve) insufficiency: Secondary | ICD-10-CM

## 2020-03-06 DIAGNOSIS — Z7982 Long term (current) use of aspirin: Secondary | ICD-10-CM | POA: Diagnosis not present

## 2020-03-06 DIAGNOSIS — I5021 Acute systolic (congestive) heart failure: Secondary | ICD-10-CM | POA: Diagnosis present

## 2020-03-06 DIAGNOSIS — M5412 Radiculopathy, cervical region: Secondary | ICD-10-CM | POA: Diagnosis present

## 2020-03-06 DIAGNOSIS — E119 Type 2 diabetes mellitus without complications: Secondary | ICD-10-CM | POA: Diagnosis present

## 2020-03-06 DIAGNOSIS — Z7984 Long term (current) use of oral hypoglycemic drugs: Secondary | ICD-10-CM

## 2020-03-06 DIAGNOSIS — I5181 Takotsubo syndrome: Secondary | ICD-10-CM | POA: Diagnosis present

## 2020-03-06 DIAGNOSIS — M19011 Primary osteoarthritis, right shoulder: Secondary | ICD-10-CM | POA: Diagnosis present

## 2020-03-06 DIAGNOSIS — Z79899 Other long term (current) drug therapy: Secondary | ICD-10-CM

## 2020-03-06 DIAGNOSIS — Z8249 Family history of ischemic heart disease and other diseases of the circulatory system: Secondary | ICD-10-CM

## 2020-03-06 DIAGNOSIS — I2510 Atherosclerotic heart disease of native coronary artery without angina pectoris: Secondary | ICD-10-CM | POA: Diagnosis not present

## 2020-03-06 DIAGNOSIS — Z823 Family history of stroke: Secondary | ICD-10-CM | POA: Diagnosis not present

## 2020-03-06 DIAGNOSIS — I25118 Atherosclerotic heart disease of native coronary artery with other forms of angina pectoris: Secondary | ICD-10-CM | POA: Diagnosis present

## 2020-03-06 DIAGNOSIS — I480 Paroxysmal atrial fibrillation: Secondary | ICD-10-CM | POA: Diagnosis present

## 2020-03-06 DIAGNOSIS — Z79890 Hormone replacement therapy (postmenopausal): Secondary | ICD-10-CM

## 2020-03-06 DIAGNOSIS — I959 Hypotension, unspecified: Secondary | ICD-10-CM | POA: Diagnosis present

## 2020-03-06 DIAGNOSIS — Z87891 Personal history of nicotine dependence: Secondary | ICD-10-CM | POA: Diagnosis not present

## 2020-03-06 DIAGNOSIS — R0600 Dyspnea, unspecified: Secondary | ICD-10-CM

## 2020-03-06 DIAGNOSIS — R0609 Other forms of dyspnea: Secondary | ICD-10-CM | POA: Diagnosis present

## 2020-03-06 DIAGNOSIS — E039 Hypothyroidism, unspecified: Secondary | ICD-10-CM | POA: Diagnosis present

## 2020-03-06 DIAGNOSIS — Z23 Encounter for immunization: Secondary | ICD-10-CM | POA: Diagnosis present

## 2020-03-06 DIAGNOSIS — R0602 Shortness of breath: Secondary | ICD-10-CM

## 2020-03-06 DIAGNOSIS — I10 Essential (primary) hypertension: Secondary | ICD-10-CM | POA: Diagnosis present

## 2020-03-06 HISTORY — PX: LEFT HEART CATH AND CORONARY ANGIOGRAPHY: CATH118249

## 2020-03-06 LAB — GLUCOSE, CAPILLARY
Glucose-Capillary: 208 mg/dL — ABNORMAL HIGH (ref 70–99)
Glucose-Capillary: 137 mg/dL — ABNORMAL HIGH (ref 70–99)

## 2020-03-06 LAB — ECHOCARDIOGRAM COMPLETE
Area-P 1/2: 3.66 cm2
Height: 62 in
S' Lateral: 2.7 cm
Weight: 2426.82 oz

## 2020-03-06 LAB — TSH: TSH: 1.327 u[IU]/mL (ref 0.350–4.500)

## 2020-03-06 LAB — BRAIN NATRIURETIC PEPTIDE: B Natriuretic Peptide: 1385.7 pg/mL — ABNORMAL HIGH (ref 0.0–100.0)

## 2020-03-06 SURGERY — LEFT HEART CATH AND CORONARY ANGIOGRAPHY
Anesthesia: LOCAL

## 2020-03-06 MED ORDER — HEPARIN SODIUM (PORCINE) 1000 UNIT/ML IJ SOLN
INTRAMUSCULAR | Status: DC | PRN
  Administered 2020-03-06: 3500 [IU] via INTRAVENOUS

## 2020-03-06 MED ORDER — ACETAMINOPHEN 325 MG PO TABS: 650.0000 mg | ORAL_TABLET | ORAL | Status: DC | PRN

## 2020-03-06 MED ORDER — INFLUENZA VAC A&B SA ADJ QUAD 0.5 ML IM PRSY
0.5000 mL | PREFILLED_SYRINGE | INTRAMUSCULAR | Status: DC
  Filled 2020-03-06: qty 0.5

## 2020-03-06 MED ORDER — SODIUM CHLORIDE 0.9% FLUSH: 3.0000 mL | INTRAVENOUS | Status: DC | PRN

## 2020-03-06 MED ORDER — POLYETHYL GLYCOL-PROPYL GLYCOL 0.4-0.3 % OP GEL
Freq: Every day | OPHTHALMIC | Status: DC | PRN
  Filled 2020-03-06: qty 10

## 2020-03-06 MED ORDER — ONDANSETRON HCL 4 MG/2ML IJ SOLN: 4.0000 mg | Freq: Four times a day (QID) | INTRAMUSCULAR | Status: DC | PRN

## 2020-03-06 MED ORDER — IOHEXOL 350 MG/ML SOLN
INTRAVENOUS | Status: DC | PRN
  Administered 2020-03-06: 80 mL

## 2020-03-06 MED ORDER — FLUTICASONE PROPIONATE 50 MCG/ACT NA SUSP
1.0000 | Freq: Every day | NASAL | Status: DC | PRN
  Filled 2020-03-06: qty 16

## 2020-03-06 MED ORDER — ASPIRIN 81 MG PO CHEW
81.0000 mg | CHEWABLE_TABLET | ORAL | Status: AC
  Administered 2020-03-06: 81 mg via ORAL
  Filled 2020-03-06: qty 1

## 2020-03-06 MED ORDER — HEPARIN (PORCINE) IN NACL 1000-0.9 UT/500ML-% IV SOLN
INTRAVENOUS | Status: DC | PRN
  Administered 2020-03-06 (×2): 500 mL

## 2020-03-06 MED ORDER — SODIUM CHLORIDE 0.9% FLUSH
3.0000 mL | Freq: Two times a day (BID) | INTRAVENOUS | Status: DC
  Administered 2020-03-07: 3 mL via INTRAVENOUS

## 2020-03-06 MED ORDER — SODIUM CHLORIDE 0.9 % WEIGHT BASED INFUSION: 1.0000 mL/kg/h | INTRAVENOUS | Status: DC

## 2020-03-06 MED ORDER — VERAPAMIL HCL 2.5 MG/ML IV SOLN
INTRAVENOUS | Status: DC | PRN
  Administered 2020-03-06: 10 mL via INTRA_ARTERIAL

## 2020-03-06 MED ORDER — LIDOCAINE HCL (PF) 1 % IJ SOLN
INTRAMUSCULAR | Status: DC | PRN
  Administered 2020-03-06: 2 mL

## 2020-03-06 MED ORDER — SODIUM CHLORIDE 0.9 % IV SOLN: 250.0000 mL | INTRAVENOUS | Status: DC | PRN

## 2020-03-06 MED ORDER — METOPROLOL TARTRATE 25 MG PO TABS: 25.0000 mg | ORAL_TABLET | Freq: Two times a day (BID) | ORAL | Status: DC

## 2020-03-06 MED ORDER — LEVOTHYROXINE SODIUM 88 MCG PO TABS
88.0000 ug | ORAL_TABLET | Freq: Every day | ORAL | Status: DC
  Administered 2020-03-07: 88 ug via ORAL
  Filled 2020-03-06: qty 1

## 2020-03-06 MED ORDER — MIDAZOLAM HCL 2 MG/2ML IJ SOLN
INTRAMUSCULAR | Status: DC | PRN
  Administered 2020-03-06: 1 mg via INTRAVENOUS

## 2020-03-06 MED ORDER — ROSUVASTATIN CALCIUM 20 MG PO TABS
20.0000 mg | ORAL_TABLET | Freq: Every day | ORAL | Status: DC
  Administered 2020-03-07: 20 mg via ORAL
  Filled 2020-03-06: qty 1

## 2020-03-06 MED ORDER — VERAPAMIL HCL 2.5 MG/ML IV SOLN
INTRAVENOUS | Status: AC
  Filled 2020-03-06: qty 2

## 2020-03-06 MED ORDER — PRAVASTATIN SODIUM 40 MG PO TABS: 40.0000 mg | ORAL_TABLET | Freq: Every day | ORAL | Status: DC

## 2020-03-06 MED ORDER — FENTANYL CITRATE (PF) 100 MCG/2ML IJ SOLN
INTRAMUSCULAR | Status: DC | PRN
Start: 2020-03-06 — End: 2020-03-06
  Administered 2020-03-06: 25 ug via INTRAVENOUS

## 2020-03-06 MED ORDER — HEPARIN SODIUM (PORCINE) 1000 UNIT/ML IJ SOLN
INTRAMUSCULAR | Status: AC
  Filled 2020-03-06: qty 1

## 2020-03-06 MED ORDER — HEPARIN (PORCINE) 25000 UT/250ML-% IV SOLN
950.0000 [IU]/h | INTRAVENOUS | Status: DC
  Administered 2020-03-06: 950 [IU]/h via INTRAVENOUS
  Filled 2020-03-06: qty 250

## 2020-03-06 MED ORDER — SODIUM CHLORIDE 0.9 % WEIGHT BASED INFUSION: 1.0000 mL/kg/h | INTRAVENOUS | Status: AC

## 2020-03-06 MED ORDER — FENTANYL CITRATE (PF) 100 MCG/2ML IJ SOLN
INTRAMUSCULAR | Status: AC
  Filled 2020-03-06: qty 2

## 2020-03-06 MED ORDER — METOPROLOL TARTRATE 50 MG PO TABS
50.0000 mg | ORAL_TABLET | Freq: Four times a day (QID) | ORAL | Status: DC
  Administered 2020-03-06 – 2020-03-07 (×3): 50 mg via ORAL
  Filled 2020-03-06 (×3): qty 1

## 2020-03-06 MED ORDER — SALONPAS PAIN RELIEF PATCH EX PTCH: MEDICATED_PATCH | Freq: Every day | CUTANEOUS | Status: DC | PRN

## 2020-03-06 MED ORDER — SODIUM CHLORIDE 0.9 % WEIGHT BASED INFUSION
3.0000 mL/kg/h | INTRAVENOUS | Status: DC
  Administered 2020-03-06: 3 mL/kg/h via INTRAVENOUS

## 2020-03-06 MED ORDER — LIDOCAINE HCL (PF) 1 % IJ SOLN
INTRAMUSCULAR | Status: AC
  Filled 2020-03-06: qty 30

## 2020-03-06 MED ORDER — MIDAZOLAM HCL 2 MG/2ML IJ SOLN
INTRAMUSCULAR | Status: AC
  Filled 2020-03-06: qty 2

## 2020-03-06 MED ORDER — LORATADINE 10 MG PO TABS
10.0000 mg | ORAL_TABLET | Freq: Every day | ORAL | Status: DC
  Administered 2020-03-06 – 2020-03-07 (×2): 10 mg via ORAL
  Filled 2020-03-06 (×2): qty 1

## 2020-03-06 MED ORDER — HEPARIN (PORCINE) IN NACL 1000-0.9 UT/500ML-% IV SOLN
INTRAVENOUS | Status: AC
  Filled 2020-03-06: qty 1000

## 2020-03-06 MED ORDER — CALCIUM CARBONATE ANTACID 500 MG PO CHEW: 1.0000 | CHEWABLE_TABLET | Freq: Every day | ORAL | Status: DC | PRN

## 2020-03-06 MED ORDER — SODIUM CHLORIDE 0.9% FLUSH
3.0000 mL | Freq: Two times a day (BID) | INTRAVENOUS | Status: DC
  Administered 2020-03-06 – 2020-03-07 (×3): 3 mL via INTRAVENOUS

## 2020-03-06 MED ORDER — DORZOLAMIDE HCL-TIMOLOL MAL 2-0.5 % OP SOLN
1.0000 [drp] | Freq: Two times a day (BID) | OPHTHALMIC | Status: DC
  Administered 2020-03-06 – 2020-03-07 (×2): 1 [drp] via OPHTHALMIC
  Filled 2020-03-06: qty 10

## 2020-03-06 SURGICAL SUPPLY — 11 items
CATH 5FR JL3.5 JR4 ANG PIG MP (CATHETERS) ×2 IMPLANT
DEVICE RAD COMP TR BAND LRG (VASCULAR PRODUCTS) ×2 IMPLANT
GLIDESHEATH SLEND SS 6F .021 (SHEATH) ×2 IMPLANT
GUIDEWIRE INQWIRE 1.5J.035X260 (WIRE) ×1 IMPLANT
INQWIRE 1.5J .035X260CM (WIRE) ×2
KIT HEART LEFT (KITS) ×2 IMPLANT
PACK CARDIAC CATHETERIZATION (CUSTOM PROCEDURE TRAY) ×2 IMPLANT
SHEATH PROBE COVER 6X72 (BAG) ×2 IMPLANT
SYR MEDRAD MARK 7 150ML (SYRINGE) ×2 IMPLANT
TRANSDUCER W/STOPCOCK (MISCELLANEOUS) ×2 IMPLANT
TUBING CIL FLEX 10 FLL-RA (TUBING) ×2 IMPLANT

## 2020-03-06 NOTE — Progress Notes (Signed)
Dr Flora Lipps requested TEE/DCCV for tomorrow but schedule will not allow for this. However, in the event of a cancellation, Dot Lanes (filling in for [employer] tomorrow) is aware to help facilitate - pt will be kept NPO after midnight in case it can be done.

## 2020-03-06 NOTE — Progress Notes (Signed)
Patient's daughter Nita Sickle called for an update on patient.  Per patient ok to give update to Leslin.

## 2020-03-06 NOTE — Progress Notes (Signed)
ANTICOAGULATION CONSULT NOTE - Initial Consult  Pharmacy Consult for heparin  Indication: atrial fibrillation  Allergies  Allergen Reactions  . Amoxicillin-Pot Clavulanate Diarrhea and Other (See Comments)    Drowsy    . Brimonidine Other (See Comments)    Conj follicles and injection   . Lisinopril Swelling and Other (See Comments)    Angioedema welling lips, throat closing    . Brimonidine Tartrate-Timolol Other (See Comments)    Redness in eye  . Zolpidem Swelling    Lips swell    Patient Measurements: Height: 5\' 2"  (157.5 cm) Weight: 68.8 kg (151 lb 10.8 oz) IBW/kg (Calculated) : 50.1  Vital Signs: Temp: 97.9 F (36.6 C) (09/27 0944) Temp Source: Oral (09/27 0944) BP: 139/97 (09/27 1100) Pulse Rate: 88 (09/27 1100)  Labs: Recent Labs    03/05/20 0910 03/05/20 1047  HGB 12.9  --   HCT 41.1  --   PLT 252  --   CREATININE 0.95  --   TROPONINIHS 22* 22*    Estimated Creatinine Clearance: 39.4 mL/min (by C-G formula based on SCr of 0.95 mg/dL).   Medical History: Past Medical History:  Diagnosis Date  . Abnormal EKG    suggestive of previous anterior wall myocardial infarction  . Diabetes mellitus (HCC)   . Hypertension   . Hypothyroidism     Medications:  Medications Prior to Admission  Medication Sig Dispense Refill Last Dose  . amLODipine (NORVASC) 2.5 MG tablet Take 2.5 mg by mouth daily.    03/06/2020 at 0445  . aspirin 81 MG tablet Take 81 mg by mouth at bedtime.      03/08/2020 atenolol (TENORMIN) 50 MG tablet Take 50 mg by mouth daily.   03/06/2020 at 0445  . bismuth subsalicylate (PEPTO BISMOL) 262 MG chewable tablet Chew 132 mg by mouth as needed for indigestion.   03/06/2020 at 0445  . cetirizine (ZYRTEC) 10 MG tablet Take 10 mg by mouth daily as needed for allergies.   03/05/2020 at 0800  . diphenhydramine-acetaminophen (TYLENOL PM) 25-500 MG TABS tablet Take 1 tablet by mouth at bedtime as needed (sleep).   Past Month at Unknown time  .  dorzolamide-timolol (COSOPT) 22.3-6.8 MG/ML ophthalmic solution Place 1 drop into the left eye 2 (two) times daily.   03/06/2020 at 0445  . fluticasone (FLONASE) 50 MCG/ACT nasal spray Place 1 spray into the nose 2 (two) times daily as needed for allergies.    03/06/2020 at 0445  . levothyroxine (SYNTHROID, LEVOTHROID) 88 MCG tablet Take 88 mcg by mouth daily.   03/06/2020 at 0445  . Melatonin 10 MG CAPS Take 10 mg by mouth at bedtime as needed (sleep).   03/05/2020 at 2200  . Menthol-Methyl Salicylate (SALONPAS PAIN RELIEF PATCH EX) Apply 1 application topically daily as needed (pain).   03/06/2020 at 0445  . Menthol-Methyl Salicylate (SALONPAS PAIN RELIEF PATCH) PTCH Apply 1 patch topically daily as needed (pain).     . metFORMIN (GLUCOPHAGE-XR) 500 MG 24 hr tablet Take 500 mg by mouth 2 (two) times daily.   Past Week at Unknown time  . nitroGLYCERIN (NITROSTAT) 0.4 MG SL tablet Place 1 tablet (0.4 mg total) under the tongue every 5 (five) minutes as needed for chest pain. 90 tablet 3   . Polyethyl Glycol-Propyl Glycol (SYSTANE OP) Place 1 drop into both eyes daily as needed (dryness).   03/06/2020 at Unknown time  . pravastatin (PRAVACHOL) 40 MG tablet Take 40 mg by mouth at bedtime.  03/05/2020 at Unknown time  . Skin Protectants, Misc. (PETROLEUM JELLY EX) Apply 1 application topically 2 (two) times daily. Apply to wound   Past Week at Unknown time  . calcium carbonate (TUMS - DOSED IN MG ELEMENTAL CALCIUM) 500 MG chewable tablet Chew 1-2 tablets by mouth daily as needed for indigestion or heartburn.   More than a month at Unknown time   Scheduled:  . dorzolamide-timolol  1 drop Left Eye BID  . levothyroxine  88 mcg Oral Daily  . loratadine  10 mg Oral Daily  . metoprolol tartrate  50 mg Oral Q6H  . [START ON 03/07/2020] rosuvastatin  20 mg Oral Daily  . sodium chloride flush  3 mL Intravenous Q12H  . sodium chloride flush  3 mL Intravenous Q12H    Assessment: 86 yo female s/p cath with new  onset afib to start heparin 8 hours post sheath removal (removed ~ 8am). Plans noted for TEE/DCCV 9/28.   Goal of Therapy:  Heparin level 0.3-0.7 units/ml Monitor platelets by anticoagulation protocol: Yes   Plan:  -start heparin at 950 units/hr at 4pm -Heparin level in 8 hours and daily wth CBC daily  Harland German, PharmD Clinical Pharmacist **Pharmacist phone directory can now be found on [email or web address] ([email or web address]).  Listed under [facility].

## 2020-03-06 NOTE — Interval H&P Note (Signed)
History and Physical Interval Note:  03/06/2020 7:13 AM  Cheryl Mora  has presented today for surgery, with the diagnosis of Chest pain.  The various methods of treatment have been discussed with the patient and family. After consideration of risks, benefits and other options for treatment, the patient has consented to  Procedure(s): LEFT HEART CATH AND CORONARY ANGIOGRAPHY (N/A) as a surgical intervention.  The patient's history has been reviewed, patient examined, no change in status, stable for surgery.  I have reviewed the patient's chart and labs.  Questions were answered to the patient's satisfaction.   Cath Lab Visit (complete for each Cath Lab visit)  Clinical Evaluation Leading to the Procedure:   ACS: No.  Non-ACS:    Anginal Classification: CCS III  Anti-ischemic medical therapy: Maximal Therapy (2 or more classes of medications)  Non-Invasive Test Results: No non-invasive testing performed  Prior CABG: No previous CABG        Theron Arista [facility] 03/06/2020 7:13 AM

## 2020-03-06 NOTE — Progress Notes (Signed)
  Echocardiogram 2D Echocardiogram has been performed.  Cheryl Mora 03/06/2020, 3:03 PM

## 2020-03-06 NOTE — Progress Notes (Signed)
Cardiology Progress Note  Patient ID: Cheryl Mora MRN: [id number] DOB: December 28, 1932 Date of Encounter: 03/06/2020  Primary Cardiologist: Jodelle Red, MD  Subjective   Chief Complaint: None.  HPI: Admitted after left heart catheterization demonstrated nonobstructive CAD.  Concerns for stress-induced cardiomyopathy.  New onset A. fib with RVR.  Denies symptoms.  ROS:  All other ROS reviewed and negative. Pertinent positives noted in the HPI.     Inpatient Medications  Scheduled Meds:  dorzolamide-timolol  1 drop Left Eye BID   levothyroxine  88 mcg Oral Daily   loratadine  10 mg Oral Daily   metoprolol tartrate  50 mg Oral Q6H   [START ON 03/07/2020] rosuvastatin  20 mg Oral Daily   sodium chloride flush  3 mL Intravenous Q12H   sodium chloride flush  3 mL Intravenous Q12H   Continuous Infusions:  sodium chloride     sodium chloride     PRN Meds: sodium chloride, acetaminophen, calcium carbonate, fluticasone, ondansetron (ZOFRAN) IV, Polyethyl Glycol-Propyl Glycol, Salonpas Pain Relief Patch, sodium chloride flush   Vital Signs   Vitals:   03/06/20 1000 03/06/20 1015 03/06/20 1045 03/06/20 1100  BP: (!) 125/114 (!) 127/96 (!) 123/93 (!) 139/97  Pulse: (!) 125 (!) 126 (!) 37 88  Resp:      Temp:      TempSrc:      SpO2: 98% 100% 97% 97%  Weight:      Height:        Intake/Output Summary (Last 24 hours) at 03/06/2020 1106 Last data filed at 03/06/2020 1100 Gross per 24 hour  Intake 120 ml  Output --  Net 120 ml   Last 3 Weights 03/06/2020 03/06/2020 03/05/2020  Weight (lbs) 151 lb 10.8 oz 150 lb 150 lb  Weight (kg) 68.8 kg 68.04 kg 68.04 kg      Telemetry  Overnight telemetry shows A. fib with RVR, which I personally reviewed.   Physical Exam   Vitals:   03/06/20 1000 03/06/20 1015 03/06/20 1045 03/06/20 1100  BP: (!) 125/114 (!) 127/96 (!) 123/93 (!) 139/97  Pulse: (!) 125 (!) 126 (!) 37 88  Resp:      Temp:      TempSrc:      SpO2: 98%  100% 97% 97%  Weight:      Height:         Intake/Output Summary (Last 24 hours) at 03/06/2020 1106 Last data filed at 03/06/2020 1100 Gross per 24 hour  Intake 120 ml  Output --  Net 120 ml    Last 3 Weights 03/06/2020 03/06/2020 03/05/2020  Weight (lbs) 151 lb 10.8 oz 150 lb 150 lb  Weight (kg) 68.8 kg 68.04 kg 68.04 kg    Body mass index is 27.74 kg/m.  General: Well nourished, well developed, in no acute distress Head: Atraumatic, normal size  Eyes: PEERLA, EOMI  Neck: Supple, no JVD Endocrine: No thryomegaly Cardiac: Normal S1, S2; irregular rhythm, no murmurs rubs or gallops Lungs: Clear to auscultation bilaterally, no wheezing, rhonchi or rales  Abd: Soft, nontender, no hepatomegaly  Ext: No edema, pulses 2+ Musculoskeletal: No deformities, BUE and BLE strength normal and equal Skin: Warm and dry, no rashes   Neuro: Alert and oriented to person, place, time, and situation, CNII-XII grossly intact, no focal deficits  Psych: Normal mood and affect   Labs  High Sensitivity Troponin:   Recent Labs  Lab 03/05/20 0910 03/05/20 1047  TROPONINIHS 22* 22*     Cardiac EnzymesNo  results for input(s): TROPONINI in the last 168 hours. No results for input(s): TROPIPOC in the last 168 hours.  Chemistry Recent Labs  Lab 03/02/20 1045 03/05/20 0910  NA 143 140  K 5.3* 3.9  CL 105 102  CO2 22 23  GLUCOSE 149* 165*  BUN 14 15  CREATININE 0.91 0.95  CALCIUM 9.3 8.4*  GFRNONAA 58* 55*  GFRAA 67 >60  ANIONGAP  --  15    Hematology Recent Labs  Lab 03/02/20 1045 03/05/20 0910  WBC 9.2 7.7  RBC 4.00 4.14  HGB 12.5 12.9  HCT 38.7 41.1  MCV 97 99.3  MCH 31.3 31.2  MCHC 32.3 31.4  RDW 13.5 14.7  PLT 234 252   BNPNo results for input(s): BNP, PROBNP in the last 168 hours.  DDimer No results for input(s): DDIMER in the last 168 hours.   Radiology  DG Chest 2 View  Result Date: 03/05/2020 CLINICAL DATA:  Acute shortness of breath. EXAM: CHEST - 2 VIEW COMPARISON:   None. FINDINGS: The cardiomediastinal silhouette is unremarkable. Patchy opacities within the lingula and LEFT LOWER lobe may represent pneumonia. There may be a trace LEFT pleural effusion present. Probable COPD/emphysema changes noted. There is no evidence of pneumothorax. IMPRESSION: 1. Patchy opacities within the lingula and LEFT LOWER lobe suspicious for infection/pneumonia. Possible trace LEFT pleural effusion. Radiographic follow-up to resolution is recommended. 2. Probable COPD/emphysema. Electronically Signed   By: Harmon Pier M.D.   On: 03/05/2020 09:44   CARDIAC CATHETERIZATION  Result Date: 03/06/2020  Prox RCA lesion is 30% stenosed.  There is severe left ventricular systolic dysfunction.  LV end diastolic pressure is normal.  The left ventricular ejection fraction is 25-35% by visual estimate.  1. Mild nonobstructive CAD 2. Severe LV dysfunction with pattern consistent with Takotsubo's cardiomyopathy. EF 30% 3. Normal LVEDP 4. New onset Atrial fibrillation compared to yesterday. Plan: admit to telemetry. Will initiate IV heparin. May consider transition to NOAC tomorrow. Hold amlodipine due to hypotension. Switch atenolol to metoprolol. Check Echo.    Cardiac Studies   Left heart cath 30% proximal RCA lesion Wall motion consistent with stress-induced cardiomyopathy LVEDP 8 mmHg  Patient Profile  Cheryl Mora is a 86 y.o. female with diabetes, hypertension who was admitted with concerns for unstable angina for left heart catheterization.  Left heart catheterization with nonobstructive CAD.  New onset A. fib with RVR and stress-induced cardiomyopathy.  Assessment & Plan   1.  New onset A. fib with RVR -She was seen in the ER yesterday.  A. fib is new.  We will start heparin drip once TR band is off.  I have increased her metoprolol tartrate to 50 mg every 6 hours for better rate control. -Hopefully she will convert back to sinus rhythm.  We will tentatively plan for  TEE/cardioversion tomorrow.  2.  New onset cardiomyopathy, stress-induced? -EF 25 to 30% on cath.  Formal TTE pending. -We will check a BNP, chest x-ray, TSH. -She reports she has had months of shortness of breath.  Unclear if this was an underlying cardiomyopathy or if this was new in the setting of significant stress.  She reports she has been quite concerned about her shortness of breath.  A. fib could have caused this as well.  Unclear. -We will get her rate under better control.  Likely add ACE or ARB tomorrow. -LVEDP 8 mmHg.  She is euvolemic on exam.  3.  Diabetes -Only on Metformin.  No A1c.  Recheck  A1c tomorrow.  4.  Hypertension -Holding amlodipine.  Starting metoprolol as above.  Will need ACE or ARB tomorrow.  FEN -IVF post-cath -Regular diet -DVT PPx: Heparin drip after TR band is off -Code: Full  For questions or updates, please contact [facility] Please consult [email or web address] for contact info under   Time Spent with Patient: I have spent a total of 35 minutes with patient reviewing hospital notes, telemetry, EKGs, labs and examining the patient as well as establishing an assessment and plan that was discussed with the patient.  > 50% of time was spent in direct patient care.    Signed, Lenna Gilford. Flora Lipps, MD [place]   [facility]  03/06/2020 11:06 AM

## 2020-03-06 NOTE — Progress Notes (Signed)
   03/06/20 0944  Assess: MEWS Score  Temp 97.9 F (36.6 C)  BP 120/74  Pulse Rate (!) 118  ECG Heart Rate (!) 119  Resp 18  Level of Consciousness Alert  SpO2 99 %  O2 Device Nasal Cannula  O2 Flow Rate (L/min) 2 L/min  Assess: MEWS Score  MEWS Temp 0  MEWS Systolic 0  MEWS Pulse 2  MEWS RR 0  MEWS LOC 0  MEWS Score 2  MEWS Score Color Yellow  Assess: if the MEWS score is Yellow or Red  Were vital signs taken at a resting state? Yes  Focused Assessment No change from prior assessment  Early Detection of Sepsis Score *See Row Information* Low  MEWS guidelines implemented *See Row Information* Yes  Treat  MEWS Interventions Escalated (See documentation below)  Take Vital Signs  Increase Vital Sign Frequency  Yellow: Q 2hr X 2 then Q 4hr X 2, if remains yellow, continue Q 4hrs  Escalate  MEWS: Escalate Yellow: discuss with charge nurse/RN and consider discussing with provider and RRT  Notify: Charge Nurse/RN  Name of Charge Nurse/RN Notified Neoma Uhrich  Date Charge Nurse/RN Notified 03/06/20  Time Charge Nurse/RN Notified 1200  Document  Patient Outcome Other (Comment) (no intervention)  Progress note created (see row info) Yes

## 2020-03-07 ENCOUNTER — Encounter (HOSPITAL_COMMUNITY): Payer: Self-pay | Admitting: Anesthesiology

## 2020-03-07 DIAGNOSIS — I5021 Acute systolic (congestive) heart failure: Secondary | ICD-10-CM

## 2020-03-07 LAB — CBC
HCT: 36.5 % (ref 36.0–46.0)
Hemoglobin: 11.5 g/dL — ABNORMAL LOW (ref 12.0–15.0)
MCH: 30.8 pg (ref 26.0–34.0)
MCHC: 31.5 g/dL (ref 30.0–36.0)
MCV: 97.9 fL (ref 80.0–100.0)
Platelets: 241 10*3/uL (ref 150–400)
RBC: 3.73 MIL/uL — ABNORMAL LOW (ref 3.87–5.11)
RDW: 14.9 % (ref 11.5–15.5)
WBC: 7.9 10*3/uL (ref 4.0–10.5)
nRBC: 0 % (ref 0.0–0.2)

## 2020-03-07 LAB — BASIC METABOLIC PANEL
Anion gap: 14 (ref 5–15)
BUN: 16 mg/dL (ref 8–23)
CO2: 18 mmol/L — ABNORMAL LOW (ref 22–32)
Calcium: 7.9 mg/dL — ABNORMAL LOW (ref 8.9–10.3)
Chloride: 108 mmol/L (ref 98–111)
Creatinine, Ser: 0.88 mg/dL (ref 0.44–1.00)
GFR calc Af Amer: >60 mL/min (ref >60)
GFR calc non Af Amer: 60 mL/min — ABNORMAL LOW (ref >60)
Glucose, Bld: 142 mg/dL — ABNORMAL HIGH (ref 70–99)
Potassium: 3.9 mmol/L (ref 3.5–5.1)
Sodium: 140 mmol/L (ref 135–145)

## 2020-03-07 LAB — HEMOGLOBIN A1C
Hgb A1c MFr Bld: 6.7 % — ABNORMAL HIGH (ref 4.8–5.6)
Mean Plasma Glucose: 145.59 mg/dL

## 2020-03-07 LAB — HEPARIN LEVEL (UNFRACTIONATED): Heparin Unfractionated: 0.31 IU/mL (ref 0.30–0.70)

## 2020-03-07 LAB — LIPID PANEL
Cholesterol: 121 mg/dL (ref 0–200)
HDL: 48 mg/dL (ref >40)
LDL Cholesterol: 51 mg/dL (ref 0–99)
Total CHOL/HDL Ratio: 2.5 RATIO
Triglycerides: 111 mg/dL (ref <150)
VLDL: 22 mg/dL (ref 0–40)

## 2020-03-07 MED ORDER — FUROSEMIDE 20 MG PO TABS: 20.0000 mg | ORAL_TABLET | ORAL | 2 refills | Status: DC | PRN

## 2020-03-07 MED ORDER — LOSARTAN POTASSIUM 25 MG PO TABS: 25.0000 mg | ORAL_TABLET | Freq: Every day | ORAL | Status: DC

## 2020-03-07 MED ORDER — APIXABAN 5 MG PO TABS: 5.0000 mg | ORAL_TABLET | Freq: Two times a day (BID) | ORAL | 11 refills | Status: DC

## 2020-03-07 MED ORDER — LOSARTAN POTASSIUM 25 MG PO TABS
25.0000 mg | ORAL_TABLET | Freq: Every day | ORAL | Status: DC
  Administered 2020-03-07: 25 mg via ORAL
  Filled 2020-03-07: qty 1

## 2020-03-07 MED ORDER — METOPROLOL SUCCINATE ER 50 MG PO TB24: 50.0000 mg | ORAL_TABLET | Freq: Every day | ORAL | 6 refills | Status: DC

## 2020-03-07 MED ORDER — LOSARTAN POTASSIUM 25 MG PO TABS: 25.0000 mg | ORAL_TABLET | Freq: Every day | ORAL | 6 refills | Status: DC

## 2020-03-07 MED ORDER — METOPROLOL SUCCINATE ER 50 MG PO TB24
50.0000 mg | ORAL_TABLET | Freq: Every day | ORAL | Status: DC
  Administered 2020-03-07: 50 mg via ORAL
  Filled 2020-03-07: qty 1

## 2020-03-07 MED ORDER — INFLUENZA VAC SPLIT QUAD 0.5 ML IM SUSY
0.5000 mL | PREFILLED_SYRINGE | Freq: Once | INTRAMUSCULAR | Status: AC
  Administered 2020-03-07: 0.5 mL via INTRAMUSCULAR

## 2020-03-07 MED ORDER — ROSUVASTATIN CALCIUM 20 MG PO TABS: 20.0000 mg | ORAL_TABLET | Freq: Every day | ORAL | 6 refills | Status: DC

## 2020-03-07 MED ORDER — APIXABAN 5 MG PO TABS
5.0000 mg | ORAL_TABLET | Freq: Two times a day (BID) | ORAL | Status: DC
  Administered 2020-03-07: 5 mg via ORAL
  Filled 2020-03-07: qty 1

## 2020-03-07 MED FILL — LOSARTAN POTASSIUM 25 MG TA: 25 | 30 days supply | Qty: 30 | Fill #0

## 2020-03-07 MED FILL — METOPROLOL SUCCINATE ER 50: 50 | 30 days supply | Qty: 30 | Fill #0

## 2020-03-07 MED FILL — FUROSEMIDE 20 MG TAB: 20 | 30 days supply | Qty: 30 | Fill #0

## 2020-03-07 MED FILL — ELIQUIS 5 MG TABLET: 5 | 30 days supply | Qty: 60 | Fill #0

## 2020-03-07 MED FILL — ROSUVASTATIN CALCIUM 20 MG: 20 | 30 days supply | Qty: 30 | Fill #0

## 2020-03-07 NOTE — Anesthesia Preprocedure Evaluation (Deleted)
Anesthesia Evaluation    Airway        Dental   Pulmonary former smoker,           Cardiovascular hypertension, Pt. on medications and Pt. on home beta blockers +CHF  + dysrhythmias Atrial Fibrillation   Hx/o Takotsubo cardiomyopathy  EKG 03/06/20 Atrial fibrillation, LAFB, anteroseptal MI  Echo 03/06/20 1. Left ventricular ejection fraction, by estimation, is 25 to 30%. The left ventricle has severely decreased function. The left ventricle demonstrates global hypokinesis. Left ventricular diastolic function could not be evaluated.  2. Right ventricular systolic function is normal. The right ventricular size is normal. There is normal pulmonary artery systolic pressure. The estimated right ventricular systolic pressure is 29.8 mmHg.  3. Left atrial size was moderately dilated.  4. Right atrial size was mildly dilated.  5. There is no evidence of cardiac tamponade.  6. The mitral valve is normal in structure. Mild mitral valve  regurgitation. No evidence of mitral stenosis. Moderate mitral annular calcification.  7. The aortic valve is normal in structure. Aortic valve regurgitation is trivial. Mild aortic valve sclerosis is present, with no evidence of aortic valve stenosis.  8. The inferior vena cava is normal in size with greater than 50% respiratory variability, suggesting right atrial pressure of 3 mmHg.   Cardiac Cath 03/06/20  Prox RCA lesion is 30% stenosed.  There is severe left ventricular systolic dysfunction.  LV end diastolic pressure is normal.  The left ventricular ejection fraction is 25-35% by visual estimate.   1. Mild nonobstructive CAD 2. Severe LV dysfunction with pattern consistent with Takotsubo's cardiomyopathy. EF 30% 3. Normal LVEDP 4. New onset Atrial fibrillation compared to yesterday.    Neuro/Psych glaucoma negative neurological ROS  negative psych ROS   GI/Hepatic Neg liver ROS, GERD   Medicated and Controlled,  Endo/Other  diabetes, Well Controlled, Type 2, Oral Hypoglycemic AgentsHypothyroidism HLD  Renal/GU negative Renal ROS  negative genitourinary   Musculoskeletal negative musculoskeletal ROS (+)   Abdominal   Peds  Hematology Eliquis therapy-last dose   Anesthesia Other Findings   Reproductive/Obstetrics                             Anesthesia Physical Anesthesia Plan  ASA: III  Anesthesia Plan: General   Post-op Pain Management:    Induction: Intravenous  PONV Risk Score and Plan: 3  Airway Management Planned: Natural Airway and Mask  Additional Equipment:   Intra-op Plan:   Post-operative Plan:   Informed Consent:   Plan Discussed with:   Anesthesia Plan Comments:         Anesthesia Quick Evaluation

## 2020-03-07 NOTE — Discharge Summary (Addendum)
Discharge Summary    Patient ID: Cheryl Mora MRN: [id number]; DOB: Apr 25, 1933  Admit date: 03/06/2020 Discharge date: 03/07/2020  Primary Care Provider: Eartha Inch, MD  Primary Cardiologist: Jodelle Red, MD   Discharge Diagnoses    Principal Problem:   Dyspnea on exertion Active Problems:   Hyperlipidemia   Type 2 diabetes mellitus without complication, without long-term current use of insulin (HCC)   HTN (hypertension)   Takotsubo cardiomyopathy   Atrial fibrillation with RVR (HCC)   Acute systolic heart failure [facility])  Diagnostic Studies/Procedures    LEFT HEART CATH AND CORONARY ANGIOGRAPHY  03/06/20  Conclusion    Prox RCA lesion is 30% stenosed.  There is severe left ventricular systolic dysfunction.  LV end diastolic pressure is normal.  The left ventricular ejection fraction is 25-35% by visual estimate.   1. Mild nonobstructive CAD 2. Severe LV dysfunction with pattern consistent with Takotsubo's cardiomyopathy. EF 30% 3. Normal LVEDP 4. New onset Atrial fibrillation compared to yesterday.  Plan: admit to telemetry. Will initiate IV heparin. May consider transition to NOAC tomorrow. Hold amlodipine due to hypotension. Switch atenolol to metoprolol. Check Echo.   Echocardiogram 03/06/20 1. Left ventricular ejection fraction, by estimation, is 25 to 30%. The  left ventricle has severely decreased function. The left ventricle  demonstrates global hypokinesis. Left ventricular diastolic function could  not be evaluated.  2. Right ventricular systolic function is normal. The right ventricular  size is normal. There is normal pulmonary artery systolic pressure. The  estimated right ventricular systolic pressure is 29.8 mmHg.  3. Left atrial size was moderately dilated.  4. Right atrial size was mildly dilated.  5. There is no evidence of cardiac tamponade.  6. The mitral valve is normal in structure. Mild mitral valve  regurgitation. No  evidence of mitral stenosis. Moderate mitral annular  calcification.  7. The aortic valve is normal in structure. Aortic valve regurgitation is  trivial. Mild aortic valve sclerosis is present, with no evidence of  aortic valve stenosis.  8. The inferior vena cava is normal in size with greater than 50%  respiratory variability, suggesting right atrial pressure of 3 mmHg.    History of Present Illness     Cheryl Mora is a 86 y.o. female with history of diabetes mellitus, hypertension, hyperlipidemia and hypothyroidism presented for outpatient cardiac catheterization.  Patient was seen by Dr. Cristal Deer 03/02/2020 for evaluation of dyspnea on exertion.  She was walking [phone] steps per day on her Fitbit.  With walking her heart rate was going to 120s lately but coming back to normal. ECG also with poor R wave progression and TWI V1-V4.  Her symptoms felt consistent with angina pectoris and cardiac catheterization arranged.  She was seen in emergency room 9/26 for left arm tingling.  She was placed on Lidoderm patch at discharge.   Hospital Course     Consultants: None  Patient presented for outpatient cardiac catheterization showing mild nonobstructive CAD with 30% proximal RCA stenosis.  Normal LVEDP.  She was found to have severe LV dysfunction, consistent with Takotsubo cardiomyopathy.  She also noted in new onset atrial fibrillation compared to ER visit a day prior.  She was admitted to telemetry and started on IV heparin for new onset atrial fibrillation with rapid ventricular rate.  She was also started on metoprolol started for rate control.  Amlodipine and atenolol held and discontinued. Echocardiogram showed LV function of 25 to 30%, mildly dilated right atrial size.  BNP 1385.  Hemoglobin  A1c 6.7.  Hemoglobin stable at 11.5.  Renal function normal. TSH normal.   Patient was converted to sinus rhythm and maintained.  Heparin transition to Eliquis.  Metoprolol consolidated to  succinate 50 mg daily and added losartan 25 mg daily.  Blood pressure was stable.  Patient walked in the hallway for about 300 feet.  Overall she did well without chest pain.  However she felt short of breath which was similar to her presenting symptoms.  She was in sinus rhythm at that time.  Discussed PT evaluation however patient declined.  Got free 30 days supplies of Eliquis. Recommended to call us if cost is an issue.   Did the patient have an acute coronary syndrome (MI, NSTEMI, STEMI, etc) this admission?:  No                               Did the patient have a percutaneous coronary intervention (stent / angioplasty)?:  No.   _____________  Discharge Vitals Blood pressure (!) 140/101, pulse 72, temperature 97.7 F (36.5 C), temperature source Oral, resp. rate 18, height 5\' 2"  (1.575 m), weight 68.8 kg, SpO2 99 %.  Filed Weights   03/06/20 0545 03/06/20 0944 03/07/20 0504  Weight: 68 kg 68.8 kg 68.8 kg    Labs & Radiologic Studies    CBC Recent Labs    03/05/20 0910 03/07/20 0159  WBC 7.7 7.9  HGB 12.9 11.5*  HCT 41.1 36.5  MCV 99.3 97.9  PLT 252 241   Basic Metabolic Panel Recent Labs    37/10/62 0910 03/07/20 0159  NA 140 140  K 3.9 3.9  CL 102 108  CO2 23 18*  GLUCOSE 165* 142*  BUN 15 16  CREATININE 0.95 0.88  CALCIUM 8.4* 7.9*   High Sensitivity Troponin:   Recent Labs  Lab 03/05/20 0910 03/05/20 1047  TROPONINIHS 22* 22*    Hemoglobin A1C Recent Labs    03/07/20 0159  HGBA1C 6.7*   Thyroid Function Tests Recent Labs    03/06/20 1254  TSH 1.327   _____________  DG Chest 2 View  Result Date: 03/05/2020 CLINICAL DATA:  Acute shortness of breath. EXAM: CHEST - 2 VIEW COMPARISON:  None. FINDINGS: The cardiomediastinal silhouette is unremarkable. Patchy opacities within the lingula and LEFT LOWER lobe may represent pneumonia. There may be a trace LEFT pleural effusion present. Probable COPD/emphysema changes noted. There is no evidence of  pneumothorax. IMPRESSION: 1. Patchy opacities within the lingula and LEFT LOWER lobe suspicious for infection/pneumonia. Possible trace LEFT pleural effusion. Radiographic follow-up to resolution is recommended. 2. Probable COPD/emphysema. Electronically Signed   By: Harmon Pier M.D.   On: 03/05/2020 09:44   CARDIAC CATHETERIZATION  Result Date: 03/06/2020  Prox RCA lesion is 30% stenosed.  There is severe left ventricular systolic dysfunction.  LV end diastolic pressure is normal.  The left ventricular ejection fraction is 25-35% by visual estimate.  1. Mild nonobstructive CAD 2. Severe LV dysfunction with pattern consistent with Takotsubo's cardiomyopathy. EF 30% 3. Normal LVEDP 4. New onset Atrial fibrillation compared to yesterday. Plan: admit to telemetry. Will initiate IV heparin. May consider transition to NOAC tomorrow. Hold amlodipine due to hypotension. Switch atenolol to metoprolol. Check Echo.   DG CHEST PORT 1 VIEW  Result Date: 03/06/2020 CLINICAL DATA:  Shortness of breath. EXAM: PORTABLE CHEST 1 VIEW COMPARISON:  PA and lateral chest 03/05/2020. FINDINGS: Patchy airspace disease in the left lung  base and lingula have markedly improved. The right lung is clear. No pneumothorax or pleural effusion. Heart size is normal. Aortic atherosclerosis. Thoracolumbar scoliosis noted. IMPRESSION: Marked improvement in lingular and left lower lobe airspace disease. Aortic Atherosclerosis (ICD10-I70.0). Electronically Signed   By: Drusilla Kanner M.D.   On: 03/06/2020 11:49   ECHOCARDIOGRAM COMPLETE  Result Date: 03/06/2020    ECHOCARDIOGRAM REPORT   Patient Name:   Cheryl Mora Date of Exam: 03/06/2020 Medical Rec #:  [id number]   Height:       62.0 in Accession #:    [id number]  Weight:       151.7 lb Date of Birth:  07/18/1932    BSA:          1.700 m Patient Age:    87 years    BP:           118/74 mmHg Patient Gender: F           HR:           108 bpm. Exam Location:  Inpatient Procedure: 2D Echo,  Cardiac Doppler and Color Doppler Indications:    I48.0 Paroxysmal atrial fibrillation  History:        Patient has no prior history of Echocardiogram examinations.                 Risk Factors:Diabetes and Hypertension. Hypothyroidism.  Sonographer:    Elmarie Shiley Dance Referring Phys: [id number] PETER M [place] IMPRESSIONS  1. Left ventricular ejection fraction, by estimation, is 25 to 30%. The left ventricle has severely decreased function. The left ventricle demonstrates global hypokinesis. Left ventricular diastolic function could not be evaluated.  2. Right ventricular systolic function is normal. The right ventricular size is normal. There is normal pulmonary artery systolic pressure. The estimated right ventricular systolic pressure is 29.8 mmHg.  3. Left atrial size was moderately dilated.  4. Right atrial size was mildly dilated.  5. There is no evidence of cardiac tamponade.  6. The mitral valve is normal in structure. Mild mitral valve regurgitation. No evidence of mitral stenosis. Moderate mitral annular calcification.  7. The aortic valve is normal in structure. Aortic valve regurgitation is trivial. Mild aortic valve sclerosis is present, with no evidence of aortic valve stenosis.  8. The inferior vena cava is normal in size with greater than 50% respiratory variability, suggesting right atrial pressure of 3 mmHg. FINDINGS  Left Ventricle: Left ventricular ejection fraction, by estimation, is 25 to 30%. The left ventricle has severely decreased function. The left ventricle demonstrates global hypokinesis. The left ventricular internal cavity size was normal in size. There is no left ventricular hypertrophy. Abnormal (paradoxical) septal motion, consistent with left bundle branch block. Left ventricular diastolic function could not be evaluated due to atrial fibrillation. Left ventricular diastolic function could not be evaluated. Right Ventricle: The right ventricular size is normal. No increase in right  ventricular wall thickness. Right ventricular systolic function is normal. There is normal pulmonary artery systolic pressure. The tricuspid regurgitant velocity is 2.59 m/s, and  with an assumed right atrial pressure of 3 mmHg, the estimated right ventricular systolic pressure is 29.8 mmHg. Left Atrium: Left atrial size was moderately dilated. Right Atrium: Right atrial size was mildly dilated. Pericardium: Trivial pericardial effusion is present. There is no evidence of cardiac tamponade. Mitral Valve: The mitral valve is normal in structure. There is mild thickening of the mitral valve leaflet(s). There is mild calcification of the mitral valve leaflet(s). Moderate mitral annular  calcification. Mild mitral valve regurgitation. No evidence of mitral valve stenosis. Tricuspid Valve: The tricuspid valve is normal in structure. Tricuspid valve regurgitation is mild . No evidence of tricuspid stenosis. Aortic Valve: The aortic valve is normal in structure. Aortic valve regurgitation is trivial. Mild aortic valve sclerosis is present, with no evidence of aortic valve stenosis. Pulmonic Valve: The pulmonic valve was normal in structure. Pulmonic valve regurgitation is not visualized. No evidence of pulmonic stenosis. Aorta: The aortic root is normal in size and structure. Venous: The inferior vena cava is normal in size with greater than 50% respiratory variability, suggesting right atrial pressure of 3 mmHg. IAS/Shunts: No atrial level shunt detected by color flow Doppler.  LEFT VENTRICLE PLAX 2D LVIDd:         4.10 cm LVIDs:         2.70 cm LV PW:         1.30 cm LV IVS:        1.20 cm LVOT diam:     1.80 cm LV SV:         36 LV SV Index:   21 LVOT Area:     2.54 cm  RIGHT VENTRICLE            IVC RV Basal diam:  2.50 cm    IVC diam: 1.90 cm RV S prime:     8.70 cm/s TAPSE (M-mode): 1.8 cm LEFT ATRIUM             Index       RIGHT ATRIUM           Index LA diam:        4.60 cm 2.71 cm/m  RA Area:     13.90 cm LA  Vol (A2C):   62.5 ml 36.77 ml/m RA Volume:   31.70 ml  18.65 ml/m LA Vol (A4C):   66.1 ml 38.89 ml/m LA Biplane Vol: 66.4 ml 39.07 ml/m  AORTIC VALVE LVOT Vmax:   73.70 cm/s LVOT Vmean:  58.750 cm/s LVOT VTI:    0.140 m  AORTA Ao Root diam: 2.80 cm Ao Asc diam:  3.20 cm MITRAL VALVE                TRICUSPID VALVE MV Area (PHT): 3.66 cm     TR Peak grad:   26.8 mmHg MV Decel Time: 207 msec     TR Vmax:        259.00 cm/s MV E velocity: 110.00 cm/s                             SHUNTS                             Systemic VTI:  0.14 m                             Systemic Diam: 1.80 cm Tobias Alexander MD Electronically signed by Tobias Alexander MD Signature Date/Time: 03/06/2020/3:09:39 PM    Final    Disposition   Pt is being discharged home today in good condition.  Follow-up Plans & Appointments     Follow-up Information    Jodelle Red, MD. Go on 03/16/2020.   Specialty: Cardiology Why:  for cath/hospital follow up  Contact information: [address] [place] [address] [place] [address] [phone]  Discharge Instructions    Diet - low sodium heart healthy   Complete by: As directed    Discharge instructions   Complete by: As directed    No driving for 48 hours. No lifting over 5 lbs for 1 week. No sexual activity for 1 week. Keep procedure site clean & dry. If you notice increased pain, swelling, bleeding or pus, call/return!  You may shower, but no soaking baths/hot tubs/pools for 1 week.   Hold metformin today. Resume 03/08/20.   Increase activity slowly   Complete by: As directed       Discharge Medications   Allergies as of 03/07/2020      Reactions   Amoxicillin-pot Clavulanate Diarrhea, Other (See Comments)   Drowsy    Brimonidine Other (See Comments)   Conj follicles and injection   Lisinopril Swelling, Other (See Comments)   Angioedema welling lips, throat closing    Brimonidine Tartrate-timolol Other (See Comments)   Redness in eye    Zolpidem Swelling   Lips swell      Medication List    STOP taking these medications   amLODipine 2.5 MG tablet Commonly known as: NORVASC   aspirin 81 MG tablet   atenolol 50 MG tablet Commonly known as: TENORMIN   pravastatin 40 MG tablet Commonly known as: PRAVACHOL     TAKE these medications   apixaban 5 MG Tabs tablet Commonly known as: ELIQUIS Take 1 tablet (5 mg total) by mouth 2 (two) times daily.   bismuth subsalicylate 262 MG chewable tablet Commonly known as: PEPTO BISMOL Chew 132 mg by mouth as needed for indigestion.   calcium carbonate 500 MG chewable tablet Commonly known as: TUMS - dosed in mg elemental calcium Chew 1-2 tablets by mouth daily as needed for indigestion or heartburn.   cetirizine 10 MG tablet Commonly known as: ZYRTEC Take 10 mg by mouth daily as needed for allergies.   diphenhydramine-acetaminophen 25-500 MG Tabs tablet Commonly known as: TYLENOL PM Take 1 tablet by mouth at bedtime as needed (sleep).   dorzolamide-timolol 22.3-6.8 MG/ML ophthalmic solution Commonly known as: COSOPT Place 1 drop into the left eye 2 (two) times daily.   fluticasone 50 MCG/ACT nasal spray Commonly known as: FLONASE Place 1 spray into the nose 2 (two) times daily as needed for allergies.   furosemide 20 MG tablet Commonly known as: Lasix Take 1 tablet (20 mg total) by mouth as needed (for shortness of breath).   levothyroxine 88 MCG tablet Commonly known as: SYNTHROID Take 88 mcg by mouth daily.   losartan 25 MG tablet Commonly known as: COZAAR Take 1 tablet (25 mg total) by mouth daily. Start taking on: March 08, 2020   Melatonin 10 MG Caps Take 10 mg by mouth at bedtime as needed (sleep).   metFORMIN 500 MG 24 hr tablet Commonly known as: GLUCOPHAGE-XR Take 500 mg by mouth 2 (two) times daily.   metoprolol succinate 50 MG 24 hr tablet Commonly known as: TOPROL-XL Take 1 tablet (50 mg total) by mouth daily. Take with or  immediately following a meal. Start taking on: March 08, 2020   nitroGLYCERIN 0.4 MG SL tablet Commonly known as: NITROSTAT Place 1 tablet (0.4 mg total) under the tongue every 5 (five) minutes as needed for chest pain.   PETROLEUM JELLY EX Apply 1 application topically 2 (two) times daily. Apply to wound   rosuvastatin 20 MG tablet Commonly known as: CRESTOR Take 1 tablet (20 mg total) by mouth daily. Start  taking on: March 08, 2020   [employer] PAIN RELIEF PATCH EX Apply 1 application topically daily as needed (pain). What changed: Another medication with the same name was removed. Continue taking this medication, and follow the directions you see here.   SYSTANE OP Place 1 drop into both eyes daily as needed (dryness).          Outstanding Labs/Studies   BMET to follow up   Duration of Discharge Encounter   Greater than 30 minutes including physician time.  Signed, Reatha Harps, MD 03/07/2020, 1:37 PM

## 2020-03-07 NOTE — TOC Benefit Eligibility Note (Signed)
Transition of Care [facility]) Benefit Eligibility Note    Patient Details  Name: Cheryl Mora MRN: [id number] Date of Birth: 1932-08-02   Medication/Dose: Everlene Balls 5 MG BID  Covered?: Yes  Tier: 3 Drug  Prescription Coverage Preferred Pharmacy: [facility] with Person/Company/Phone Number:: JASMINE  @  [employer] # [phone]  Co-Pay: $45.00  Prior Approval: Yes (#  [id number])  Deductible: Unmet       Mardene Sayer Phone Number: 03/07/2020, 2:20 PM

## 2020-03-07 NOTE — Discharge Instructions (Signed)
Information on my medicine - ELIQUIS (apixaban)  Why was Eliquis prescribed for you? Eliquis was prescribed for you to reduce the risk of a blood clot forming that can cause a stroke if you have a medical condition called atrial fibrillation (a type of irregular heartbeat).  What do You need to know about Eliquis ? Take your Eliquis TWICE DAILY - one tablet in the morning and one tablet in the evening with or without food. If you have difficulty swallowing the tablet whole please discuss with your pharmacist how to take the medication safely.  Take Eliquis exactly as prescribed by your doctor and DO NOT stop taking Eliquis without talking to the doctor who prescribed the medication.  Stopping may increase your risk of developing a stroke.  Refill your prescription before you run out.  After discharge, you should have regular check-up appointments with your healthcare provider that is prescribing your Eliquis.  In the future your dose may need to be changed if your kidney function or weight changes by a significant amount or as you get older.  What do you do if you miss a dose? If you miss a dose, take it as soon as you remember on the same day and resume taking twice daily.  Do not take more than one dose of ELIQUIS at the same time to make up a missed dose.  Important Safety Information A possible side effect of Eliquis is bleeding. You should call your healthcare provider right away if you experience any of the following: ? Bleeding from an injury or your nose that does not stop. ? Unusual colored urine (red or dark brown) or unusual colored stools (red or black). ? Unusual bruising for unknown reasons. ? A serious fall or if you hit your head (even if there is no bleeding).  Some medicines may interact with Eliquis and might increase your risk of bleeding or clotting while on Eliquis. To help avoid this, consult your healthcare provider or pharmacist prior to using any new  prescription or non-prescription medications, including herbals, vitamins, non-steroidal anti-inflammatory drugs (NSAIDs) and supplements.  This website has more information on Eliquis (apixaban): http://www.eliquis.com/eliquis/home      Can restart metformin on 9/29 in evening

## 2020-03-07 NOTE — Progress Notes (Signed)
ANTICOAGULATION CONSULT NOTE   Pharmacy Consult for Heparin  Indication: atrial fibrillation  Allergies  Allergen Reactions  . Amoxicillin-Pot Clavulanate Diarrhea and Other (See Comments)    Drowsy    . Brimonidine Other (See Comments)    Conj follicles and injection   . Lisinopril Swelling and Other (See Comments)    Angioedema welling lips, throat closing    . Brimonidine Tartrate-Timolol Other (See Comments)    Redness in eye  . Zolpidem Swelling    Lips swell    Patient Measurements: Height: 5\' 2"  (157.5 cm) Weight: 68.8 kg (151 lb 10.8 oz) IBW/kg (Calculated) : 50.1  Vital Signs: Temp: 98 F (36.7 C) (09/27 2336) Temp Source: Oral (09/27 2336) BP: 115/72 (09/27 2336) Pulse Rate: 72 (09/27 2336)  Labs: Recent Labs    03/05/20 0910 03/05/20 1047 03/07/20 0159  HGB 12.9  --  11.5*  HCT 41.1  --  36.5  PLT 252  --  241  HEPARINUNFRC  --   --  0.31  CREATININE 0.95  --  0.88  TROPONINIHS 22* 22*  --     Estimated Creatinine Clearance: 42.5 mL/min (by C-G formula based on SCr of 0.88 mg/dL).   Medical History: Past Medical History:  Diagnosis Date  . Abnormal EKG    suggestive of previous anterior wall myocardial infarction  . Diabetes mellitus (HCC)   . Hypertension   . Hypothyroidism     Medications:  Medications Prior to Admission  Medication Sig Dispense Refill Last Dose  . amLODipine (NORVASC) 2.5 MG tablet Take 2.5 mg by mouth daily.    03/06/2020 at 0445  . aspirin 81 MG tablet Take 81 mg by mouth at bedtime.      03/08/2020 atenolol (TENORMIN) 50 MG tablet Take 50 mg by mouth daily.   03/06/2020 at 0445  . bismuth subsalicylate (PEPTO BISMOL) 262 MG chewable tablet Chew 132 mg by mouth as needed for indigestion.   03/06/2020 at 0445  . cetirizine (ZYRTEC) 10 MG tablet Take 10 mg by mouth daily as needed for allergies.   03/05/2020 at 0800  . diphenhydramine-acetaminophen (TYLENOL PM) 25-500 MG TABS tablet Take 1 tablet by mouth at bedtime as needed  (sleep).   Past Month at Unknown time  . dorzolamide-timolol (COSOPT) 22.3-6.8 MG/ML ophthalmic solution Place 1 drop into the left eye 2 (two) times daily.   03/06/2020 at 0445  . fluticasone (FLONASE) 50 MCG/ACT nasal spray Place 1 spray into the nose 2 (two) times daily as needed for allergies.    03/06/2020 at 0445  . levothyroxine (SYNTHROID, LEVOTHROID) 88 MCG tablet Take 88 mcg by mouth daily.   03/06/2020 at 0445  . Melatonin 10 MG CAPS Take 10 mg by mouth at bedtime as needed (sleep).   03/05/2020 at 2200  . Menthol-Methyl Salicylate ([employer] PAIN RELIEF PATCH EX) Apply 1 application topically daily as needed (pain).   03/06/2020 at 0445  . Menthol-Methyl Salicylate (SALONPAS PAIN RELIEF PATCH) PTCH Apply 1 patch topically daily as needed (pain).     . metFORMIN (GLUCOPHAGE-XR) 500 MG 24 hr tablet Take 500 mg by mouth 2 (two) times daily.   Past Week at Unknown time  . nitroGLYCERIN (NITROSTAT) 0.4 MG SL tablet Place 1 tablet (0.4 mg total) under the tongue every 5 (five) minutes as needed for chest pain. 90 tablet 3   . Polyethyl Glycol-Propyl Glycol (SYSTANE OP) Place 1 drop into both eyes daily as needed (dryness).   03/06/2020 at Unknown time  .  pravastatin (PRAVACHOL) 40 MG tablet Take 40 mg by mouth at bedtime.    03/05/2020 at Unknown time  . Skin Protectants, Misc. (PETROLEUM JELLY EX) Apply 1 application topically 2 (two) times daily. Apply to wound   Past Week at Unknown time  . calcium carbonate (TUMS - DOSED IN MG ELEMENTAL CALCIUM) 500 MG chewable tablet Chew 1-2 tablets by mouth daily as needed for indigestion or heartburn.   More than a month at Unknown time   Scheduled:  . dorzolamide-timolol  1 drop Left Eye BID  . influenza vaccine adjuvanted  0.5 mL Intramuscular Tomorrow-1000  . levothyroxine  88 mcg Oral Daily  . loratadine  10 mg Oral Daily  . metoprolol tartrate  50 mg Oral Q6H  . rosuvastatin  20 mg Oral Daily  . sodium chloride flush  3 mL Intravenous Q12H  . sodium  chloride flush  3 mL Intravenous Q12H    Assessment: 86 yo female s/p cath with new onset afib to start heparin 8 hours post sheath removal (removed ~ 8am). Plans noted for TEE/DCCV 9/28.  9/28 AM update:  Initial heparin level therapeutic    Goal of Therapy:  Heparin level 0.3-0.7 units/ml Monitor platelets by anticoagulation protocol: Yes   Plan:  Cont heparin 950 units/hr Confirmatory heparin level at 1200  Abran Duke, PharmD, BCPS Clinical Pharmacist Phone: [phone]

## 2020-03-08 SURGERY — ECHOCARDIOGRAM, TRANSESOPHAGEAL
Anesthesia: General

## 2020-03-16 ENCOUNTER — Other Ambulatory Visit: Payer: Self-pay

## 2020-03-16 ENCOUNTER — Encounter: Payer: Self-pay | Admitting: Cardiology

## 2020-03-16 ENCOUNTER — Ambulatory Visit: Payer: Medicare HMO | Admitting: Cardiology

## 2020-03-16 VITALS — BP 132/76 | HR 120 | Ht 62.0 in | Wt 150.2 lb

## 2020-03-16 DIAGNOSIS — E119 Type 2 diabetes mellitus without complications: Secondary | ICD-10-CM | POA: Diagnosis not present

## 2020-03-16 DIAGNOSIS — R0600 Dyspnea, unspecified: Secondary | ICD-10-CM

## 2020-03-16 DIAGNOSIS — R0609 Other forms of dyspnea: Secondary | ICD-10-CM

## 2020-03-16 DIAGNOSIS — E785 Hyperlipidemia, unspecified: Secondary | ICD-10-CM

## 2020-03-16 DIAGNOSIS — I4891 Unspecified atrial fibrillation: Secondary | ICD-10-CM

## 2020-03-16 DIAGNOSIS — I428 Other cardiomyopathies: Secondary | ICD-10-CM

## 2020-03-16 DIAGNOSIS — Z7189 Other specified counseling: Secondary | ICD-10-CM

## 2020-03-16 DIAGNOSIS — I2510 Atherosclerotic heart disease of native coronary artery without angina pectoris: Secondary | ICD-10-CM

## 2020-03-16 MED ORDER — AMIODARONE HCL 200 MG PO TABS: ORAL_TABLET | ORAL | 0 refills | Status: DC

## 2020-03-16 NOTE — Progress Notes (Signed)
Cardiology Office Note:    Date:  03/16/2020   ID:  Cheryl Mora, DOB 05-27-1933, MRN [id number]  PCP:  Cheryl Noon, MD  Cardiologist:  Cheryl Dresser, MD  Referring MD: Cheryl Noon, MD   CC: post hospital follow up  History of Present Illness:    Cheryl Mora is a 86 y.o. female with a hx of type II diabetes, hyperlipidemia, hypertension, hypothyroidism who is seen for follow up today.   Cardiac history: I met her 03/02/20 as a new consult for DOE. Very concerning story, set up for outpatient cath. Cath 9/27 with NICM, reduced EF, and new afib RVR. Her medications were adjusted, and she spontaneously converted to sinus rhythm prior to discharge 03/07/20  Today: Seen for transition of care appt today. Hospitalization and discharge summary from 03/07/20  reviewed in detail. Full medication reconciliation done today.  Her daughter is present today. Reviewed entire hospitalization, test results.   Brings log of blood pressures today. Range 102/72-136/86, HR 86-125. She is in afib with RVR today. Cannot tell when she is in rhythm or not.   Has only taken lasix three times since discharge for shortness of breath. Discussed weight based dosing today. Was 150.2 lbs on home scale today.  We discussed paroxysmal atrial fibrillation and reduced EF today. Discussed tikosyn and amiodarone, see below. She is tolerating apixaban, though has significant bruising. No melena, hematochezia, or hematuria.  Discussed cardiomyopathy. Tolerating metoprolol. Discussed that borderline blood pressures prevent addition of several other classes of medications. Discussed SGLT2i today, revisit in the future.  Denies chest pain. No PND, orthopnea, LE edema or unexpected weight gain. No syncope or palpitations. Remains dyspneic on exertion.  Past Medical History:  Diagnosis Date  . Abnormal EKG    suggestive of previous anterior wall myocardial infarction  . Diabetes mellitus ([place])   .  Hypertension   . Hypothyroidism     Past Surgical History:  Procedure Laterality Date  . ABDOMINAL HYSTERECTOMY    . CATARACT EXTRACTION    . KNEE SURGERY    . LEFT HEART CATH AND CORONARY ANGIOGRAPHY N/A 03/06/2020   Procedure: LEFT HEART CATH AND CORONARY ANGIOGRAPHY;  Surgeon: Martinique, Peter M, MD;  Location: [facility];  Service: Cardiovascular;  Laterality: N/A;    Current Medications: Current Outpatient Medications on File Prior to Visit  Medication Sig  . apixaban (ELIQUIS) 5 MG TABS tablet Take 1 tablet (5 mg total) by mouth 2 (two) times daily.  . calcium carbonate (TUMS - DOSED IN MG ELEMENTAL CALCIUM) 500 MG chewable tablet Chew 1-2 tablets by mouth daily as needed for indigestion or heartburn.  . cetirizine (ZYRTEC) 10 MG tablet Take 10 mg by mouth daily as needed for allergies.  . diphenhydramine-acetaminophen (TYLENOL PM) 25-500 MG TABS tablet Take 1 tablet by mouth at bedtime as needed (sleep).  . dorzolamide-timolol (COSOPT) 22.3-6.8 MG/ML ophthalmic solution Place 1 drop into the left eye 2 (two) times daily.  . fluticasone (FLONASE) 50 MCG/ACT nasal spray Place 1 spray into the nose 2 (two) times daily as needed for allergies.   . furosemide (LASIX) 20 MG tablet Take 1 tablet (20 mg total) by mouth as needed (for shortness of breath).  Marland Kitchen levothyroxine (SYNTHROID, LEVOTHROID) 88 MCG tablet Take 88 mcg by mouth daily.  Marland Kitchen losartan (COZAAR) 25 MG tablet Take 1 tablet (25 mg total) by mouth daily.  . Melatonin 10 MG CAPS Take 10 mg by mouth at bedtime as needed (sleep).  Marland Kitchen  Menthol-Methyl Salicylate (SALONPAS PAIN RELIEF PATCH EX) Apply 1 application topically daily as needed (pain).  . metFORMIN (GLUCOPHAGE-XR) 500 MG 24 hr tablet Take 500 mg by mouth 2 (two) times daily.  . metoprolol succinate (TOPROL-XL) 50 MG 24 hr tablet Take 1 tablet (50 mg total) by mouth daily. Take with or immediately following a meal.  . nitroGLYCERIN (NITROSTAT) 0.4 MG SL tablet Place 1  tablet (0.4 mg total) under the tongue every 5 (five) minutes as needed for chest pain.  Vladimir Faster Glycol-Propyl Glycol (SYSTANE OP) Place 1 drop into both eyes daily as needed (dryness).  . rosuvastatin (CRESTOR) 20 MG tablet Take 1 tablet (20 mg total) by mouth daily.  . Skin Protectants, Misc. (PETROLEUM JELLY EX) Apply 1 application topically 2 (two) times daily. Apply to wound   No current facility-administered medications on file prior to visit.     Allergies:   Amoxicillin-pot clavulanate, Brimonidine, Lisinopril, Brimonidine tartrate-timolol, and Zolpidem   Social History   Tobacco Use  . Smoking status: Former Research scientist (life sciences)  . Smokeless tobacco: Never Used  Substance Use Topics  . Alcohol use: Yes    Comment: several a day  . Drug use: Not on file    Family History: family history includes Cirrhosis in her mother; Heart attack in an other family member; Hypertension in her mother; Stroke in her mother.  ROS:   Please see the history of present illness.  Additional pertinent ROS otherwise unremarkable.   EKGs/Labs/Other Studies Reviewed:    The following studies were reviewed today: No prior cardiac studies.  EKG:  EKG is personally reviewed.  The ekg ordered today demonstrates atrial fibrillation with RVR at 120 bpm  Recent Labs: 03/06/2020: B Natriuretic Peptide 1,385.7; TSH 1.327 03/07/2020: BUN 16; Creatinine, Ser 0.88; Hemoglobin 11.5; Platelets 241; Potassium 3.9; Sodium 140  Recent Lipid Panel    Component Value Date/Time   CHOL 121 03/07/2020 1150   TRIG 111 03/07/2020 1150   HDL 48 03/07/2020 1150   CHOLHDL 2.5 03/07/2020 1150   VLDL 22 03/07/2020 1150   LDLCALC 51 03/07/2020 1150    Physical Exam:    VS:  BP 132/76   Pulse (!) 120   Ht _0  (1.575 m)   Wt 150 lb 3.2 oz (68.1 kg)   SpO2 95%   BMI 27.47 kg/m     Wt Readings from Last 3 Encounters:  03/16/20 150 lb 3.2 oz (68.1 kg)  03/07/20 151 lb 10.8 oz (68.8 kg)  03/05/20 150 lb (68 kg)      GEN: Well nourished, well developed in no acute distress HEENT: Normal, moist mucous membranes NECK: No JVD CARDIAC: tachycardic, irregularly irregular rhythm, normal S1 and S2, no rubs or gallops. No murmur. VASCULAR: Radial and DP pulses 2+ bilaterally. No carotid bruits RESPIRATORY:  Clear to auscultation without rales, wheezing or rhonchi  ABDOMEN: Soft, non-tender, non-distended MUSCULOSKELETAL:  Ambulates independently SKIN: Warm and dry, no edema NEUROLOGIC:  Alert and oriented x 3. No focal neuro deficits noted. PSYCHIATRIC:  Normal affect   ASSESSMENT:    1. Atrial fibrillation with RVR ([place])   2. Dyspnea on exertion   3. Hyperlipidemia, unspecified hyperlipidemia type   4. Type 2 diabetes mellitus without complication, without long-term current use of insulin (HCC)   5. Cardiac risk counseling   6. Counseling on health promotion and disease prevention   7. NICM (nonischemic cardiomyopathy) ([place])   8. Nonocclusive coronary atherosclerosis of native coronary artery    PLAN:  Dyspnea on exertion: I suspect this is due to her paroxysmal atrial fibrillation and nonischemic cardiomyopathy. She is in afib RVR today but asymptomatic -cath showed reduced EF, nonischemic cardiomyopathy -echo confirmed reduced EF -baseline borderline low blood pressure limits medical management. Tolerating metoprolol succcinate, but no roo, for ACEi/ARB/ARNI -we discussed how atrial fibrillation can impact the heart. Discussed the risk of stroke at length. Also discussed rate vs. Rhythm control. With her cardiomyopathy, would like to try rhythm control. Discussed tikosyn and amiodarone, she would prefer amiodarone. Will load as an outpatient. Once loaded, consider cardioversion if in afib on follow up -CHA2DS2/VAS Stroke Risk Points=  6   Hyperlipidemia: given nonocclusive CAD, needs statin, goal LDL <70 -lipids reviewed, LDL 51 -changed to rosuvastatin given nonobstructive CAD  Type II  diabetes: -A1c 6.7 -on metformin only -given NICM, discussed SGLT2i today. We will continue to discuss, need to make sure it is affordable for her  Cardiac risk counseling and prevention recommendations: -recommend heart healthy/Mediterranean diet, with whole grains, fruits, vegetable, fish, lean meats, nuts, and olive oil. Limit salt. -recommend moderate walking, 3-5 times/week for 30-50 minutes each session. Aim for at least 150 minutes.week. Goal should be pace of 3 miles/hours, or walking 1.5 miles in 30 minutes -recommend avoidance of tobacco products. Avoid excess alcohol. -Additional risk factor control:  -Weight: BMI 27  Plan for follow up: 3 weeks  Cheryl Dresser, MD, PhD [place]  [facility]    Medication Adjustments/Labs and Tests Ordered: Current medicines are reviewed at length with the patient today.  Concerns regarding medicines are outlined above.  Orders Placed This Encounter  Procedures  . EKG 12-Lead   Meds ordered this encounter  Medications  . amiodarone (PACERONE) 200 MG tablet    Sig: Take 1 tablet (200 mg total) by mouth 2 (two) times daily for 14 days, THEN 1 tablet (200 mg total) daily for 16 days.    Dispense:  44 tablet    Refill:  0    Patient Instructions  Medication Instructions:   Amiodarone Take 1 tablet (200 mg total) by mouth 2 (two) times daily for 14 days, THEN 1 tablet (200 mg total) daily for 16 days  *If you need a refill on your cardiac medications before your next appointment, please call your pharmacy*   Lab Work: None ordered   Testing/Procedures: None ordered    Follow-Up: At [facility], you and your health needs are our priority.  As part of our continuing mission to provide you with exceptional heart care, we have created designated Provider Care Teams.  These Care Teams include your primary Cardiologist (physician) and Advanced Practice Providers (APPs -  Physician Assistants and Nurse Practitioners)  who all work together to provide you with the care you need, when you need it.  We recommend signing up for the patient portal called "MyChart".  Sign up information is provided on this After Visit Summary.  MyChart is used to connect with patients for Virtual Visits (Telemedicine).  Patients are able to view lab/test results, encounter notes, upcoming appointments, etc.  Non-urgent messages can be sent to your provider as well.   To learn more about what you can do with MyChart, go to [email or web address].    Your next appointment:   3 week(s)  The format for your next appointment:   In Person  Provider:   [name], MD   Do the following things EVERY DAY:  1) Weigh yourself EVERY morning after you go to the  bathroom but before you eat or drink anything. Write this number down in a weight log/diary. If you gain 3 pounds overnight or 5 pounds in a week, call the office.  2) Take your medicines as prescribed. If you have concerns about your medications, please call us before you stop taking them.   3) Eat low salt foods--Limit salt (sodium) to 2000 mg per day. This will help prevent your body from holding onto fluid. Read food labels as many processed foods have a lot of sodium, especially canned goods and prepackaged meats. If you would like some assistance choosing low sodium foods, we would be happy to set you up with a nutritionist.  4) Stay as active as you can everyday. Staying active will give you more energy and make your muscles stronger. Start with 5 minutes at a time and work your way up to 30 minutes a day. Break up your activities--do some in the morning and some in the afternoon. Start with 3 days per week and work your way up to 5 days as you can.  If you have chest pain, feel short of breath, dizzy, or lightheaded, STOP. If you don't feel better after a short rest, call 911. If you do feel better, call the office to let us know you have symptoms with  exercise.  5) Limit all fluids for the day to less than 2 liters. Fluid includes all drinks, coffee, juice, ice chips, soup, jello, and all other liquids.  Consider kardia mobile for rhythm check or pulse oximeter if it is hard to check heart rate on your blood pressure cuff.  [place] is part of the [employer] computer system, you do not have to switch but the [place] groups are part of [employer]. We communicate with non-[facility] groups regularly as well.   Signed, Cheryl Dresser, MD PhD 03/16/2020 12:58 PM    [place]

## 2020-03-16 NOTE — Patient Instructions (Addendum)
Medication Instructions:   Amiodarone Take 1 tablet (200 mg total) by mouth 2 (two) times daily for 14 days, THEN 1 tablet (200 mg total) daily for 16 days  *If you need a refill on your cardiac medications before your next appointment, please call your pharmacy*   Lab Work: None ordered   Testing/Procedures: None ordered    Follow-Up: At [facility], you and your health needs are our priority.  As part of our continuing mission to provide you with exceptional heart care, we have created designated Provider Care Teams.  These Care Teams include your primary Cardiologist (physician) and Advanced Practice Providers (APPs -  Physician Assistants and Nurse Practitioners) who all work together to provide you with the care you need, when you need it.  We recommend signing up for the patient portal called "MyChart".  Sign up information is provided on this After Visit Summary.  MyChart is used to connect with patients for Virtual Visits (Telemedicine).  Patients are able to view lab/test results, encounter notes, upcoming appointments, etc.  Non-urgent messages can be sent to your provider as well.   To learn more about what you can do with MyChart, go to [email or web address].    Your next appointment:   3 week(s)  The format for your next appointment:   In Person  Provider:   Jodelle Red, MD   Do the following things EVERY DAY:  1) Weigh yourself EVERY morning after you go to the bathroom but before you eat or drink anything. Write this number down in a weight log/diary. If you gain 3 pounds overnight or 5 pounds in a week, call the office.  2) Take your medicines as prescribed. If you have concerns about your medications, please call us before you stop taking them.   3) Eat low salt foods--Limit salt (sodium) to 2000 mg per day. This will help prevent your body from holding onto fluid. Read food labels as many processed foods have a lot of sodium, especially canned  goods and prepackaged meats. If you would like some assistance choosing low sodium foods, we would be happy to set you up with a nutritionist.  4) Stay as active as you can everyday. Staying active will give you more energy and make your muscles stronger. Start with 5 minutes at a time and work your way up to 30 minutes a day. Break up your activities--do some in the morning and some in the afternoon. Start with 3 days per week and work your way up to 5 days as you can.  If you have chest pain, feel short of breath, dizzy, or lightheaded, STOP. If you don't feel better after a short rest, call 911. If you do feel better, call the office to let us know you have symptoms with exercise.  5) Limit all fluids for the day to less than 2 liters. Fluid includes all drinks, coffee, juice, ice chips, soup, jello, and all other liquids.  Consider kardia mobile for rhythm check or pulse oximeter if it is hard to check heart rate on your blood pressure cuff.  [place] is part of the [employer] computer system, you do not have to switch but the [place] groups are part of [employer]. We communicate with non-[facility] groups regularly as well.

## 2020-03-21 ENCOUNTER — Telehealth: Payer: Medicare HMO | Admitting: Cardiology

## 2020-03-28 ENCOUNTER — Other Ambulatory Visit: Payer: Self-pay | Admitting: Cardiology

## 2020-03-28 MED ORDER — APIXABAN 5 MG PO TABS: 5.0000 mg | ORAL_TABLET | Freq: Two times a day (BID) | ORAL | 2 refills | Status: DC

## 2020-03-28 MED ORDER — METOPROLOL SUCCINATE ER 50 MG PO TB24
50.0000 mg | ORAL_TABLET | Freq: Every day | ORAL | 2 refills | Status: DC
Start: 2020-03-28 — End: 2020-08-23

## 2020-03-28 MED ORDER — LOSARTAN POTASSIUM 25 MG PO TABS
25.0000 mg | ORAL_TABLET | Freq: Every day | ORAL | 2 refills | Status: DC
Start: 2020-03-28 — End: 2020-07-05

## 2020-03-28 MED ORDER — ROSUVASTATIN CALCIUM 20 MG PO TABS
20.0000 mg | ORAL_TABLET | Freq: Every day | ORAL | 2 refills | Status: DC
Start: 2020-03-28 — End: 2020-11-15

## 2020-03-28 NOTE — Telephone Encounter (Signed)
Rx has been sent to the pharmacy electronically. ° °

## 2020-03-28 NOTE — Telephone Encounter (Signed)
°*  STAT* If patient is at the pharmacy, call can be transferred to refill team.   1. Which medications need to be refilled? (please list name of each medication and dose if known)  apixaban (ELIQUIS) 5 MG TABS tablet [[id number]]  rosuvastatin (CRESTOR) 20 MG tablet [[id number]] metoprolol succinate (TOPROL-XL) 50 MG 24 hr tablet  losartan (COZAAR) 25 MG tablet [[id number]]    2. Which pharmacy/location (including street and city if local pharmacy) is medication to be sent to? [facility] . Phone number [phone] or [phone] New pharmacy   3. Do they need a 30 day or 90 day supply? 90

## 2020-04-03 ENCOUNTER — Telehealth: Payer: Self-pay

## 2020-04-03 NOTE — Telephone Encounter (Signed)
**Note De-Identified Cheryl Mora Obfuscation** I started a Rosuvastatin PA through covermymeds: Key: [id number]

## 2020-04-06 ENCOUNTER — Encounter: Payer: Self-pay | Admitting: Cardiology

## 2020-04-06 ENCOUNTER — Ambulatory Visit: Payer: Medicare HMO | Admitting: Cardiology

## 2020-04-06 ENCOUNTER — Other Ambulatory Visit: Payer: Self-pay

## 2020-04-06 VITALS — BP 128/72 | HR 90 | Ht 62.0 in | Wt 151.0 lb

## 2020-04-06 DIAGNOSIS — E119 Type 2 diabetes mellitus without complications: Secondary | ICD-10-CM | POA: Diagnosis not present

## 2020-04-06 DIAGNOSIS — E785 Hyperlipidemia, unspecified: Secondary | ICD-10-CM

## 2020-04-06 DIAGNOSIS — I2510 Atherosclerotic heart disease of native coronary artery without angina pectoris: Secondary | ICD-10-CM

## 2020-04-06 DIAGNOSIS — I428 Other cardiomyopathies: Secondary | ICD-10-CM | POA: Diagnosis not present

## 2020-04-06 DIAGNOSIS — I4891 Unspecified atrial fibrillation: Secondary | ICD-10-CM

## 2020-04-06 DIAGNOSIS — I10 Essential (primary) hypertension: Secondary | ICD-10-CM

## 2020-04-06 DIAGNOSIS — Z01812 Encounter for preprocedural laboratory examination: Secondary | ICD-10-CM

## 2020-04-06 DIAGNOSIS — R0600 Dyspnea, unspecified: Secondary | ICD-10-CM

## 2020-04-06 DIAGNOSIS — I4819 Other persistent atrial fibrillation: Secondary | ICD-10-CM

## 2020-04-06 LAB — BASIC METABOLIC PANEL
BUN/Creatinine Ratio: 15 (ref 12–28)
BUN: 17 mg/dL (ref 8–27)
CO2: 21 mmol/L (ref 20–29)
Calcium: 8.9 mg/dL (ref 8.7–10.3)
Chloride: 105 mmol/L (ref 96–106)
Creatinine, Ser: 1.16 mg/dL — ABNORMAL HIGH (ref 0.57–1.00)
GFR calc Af Amer: 50 mL/min/{1.73_m2} — ABNORMAL LOW (ref >59)
GFR calc non Af Amer: 43 mL/min/{1.73_m2} — ABNORMAL LOW (ref >59)
Glucose: 132 mg/dL — ABNORMAL HIGH (ref 65–99)
Potassium: 4.6 mmol/L (ref 3.5–5.2)
Sodium: 143 mmol/L (ref 134–144)

## 2020-04-06 LAB — CBC
Hematocrit: 38.8 % (ref 34.0–46.6)
Hemoglobin: 12.5 g/dL (ref 11.1–15.9)
MCH: 31.2 pg (ref 26.6–33.0)
MCHC: 32.2 g/dL (ref 31.5–35.7)
MCV: 97 fL (ref 79–97)
Platelets: 224 10*3/uL (ref 150–450)
RBC: 4.01 x10E6/uL (ref 3.77–5.28)
RDW: 13.2 % (ref 11.7–15.4)
WBC: 8.4 10*3/uL (ref 3.4–10.8)

## 2020-04-06 MED ORDER — AMIODARONE HCL 200 MG PO TABS: 200.0000 mg | ORAL_TABLET | Freq: Every day | ORAL | 3 refills | Status: DC

## 2020-04-06 MED ORDER — EMPAGLIFLOZIN 10 MG PO TABS: 10.0000 mg | ORAL_TABLET | Freq: Every day | ORAL | 3 refills | Status: AC

## 2020-04-06 NOTE — Progress Notes (Signed)
Cardiology Office Note:    Date:  04/06/2020   ID:  Cheryl Mora, DOB 12/02/1932, MRN [id number]  PCP:  Badger, Michael C, MD  Cardiologist:  Kelcy Baeten, MD  Referring MD: Badger, Michael C, MD   CC: follow up  History of Present Illness:    Cheryl Mora is a 87 y.o. female with a hx of type II diabetes, hyperlipidemia, hypertension, hypothyroidism, paroxysmal atrial fibrillation who is seen for follow up today.   Cardiac history: I met her 03/02/20 as a new consult for DOE. Very concerning story, set up for outpatient cath. Cath 9/27 with NICM, reduced EF, and new afib RVR. Her medications were adjusted, and she spontaneously converted to sinus rhythm prior to discharge 03/07/20  Today: Here for close follow up visit. Tolerating amiodarone, doesn't feel any different. Brings logs of BP, HR, and weights. Able to walk the neighborhood without getting out of breath. Working on increasing her activity at home.  Blood pressures have ranged 99/78-145/100, HR 93-135. Weights steady 148-151 lbs. Has taken lasix twice since our last visit.   Now onto amiodarone once/daily.   We discussed atrial fibrillation, cardioversion, cardiomyopathy, nonobstructive CAD at length today. She has missed no doses of apixaban. We also discussed SGLT2i today. She got a note from [employer] that rosuvastatin wasn't covered. Unclear why this is, as it is generic.  Denies chest pain, shortness of breath at rest or with normal exertion. No PND, orthopnea, LE edema or unexpected weight gain. No syncope or palpitations.  Past Medical History:  Diagnosis Date  . Abnormal EKG    suggestive of previous anterior wall myocardial infarction  . Diabetes mellitus (HCC)   . Hypertension   . Hypothyroidism     Past Surgical History:  Procedure Laterality Date  . ABDOMINAL HYSTERECTOMY    . CATARACT EXTRACTION    . KNEE SURGERY    . LEFT HEART CATH AND CORONARY ANGIOGRAPHY N/A 03/06/2020   Procedure:  LEFT HEART CATH AND CORONARY ANGIOGRAPHY;  Surgeon: Jordan, Peter M, MD;  Location: [facility];  Service: Cardiovascular;  Laterality: N/A;    Current Medications: Current Outpatient Medications on File Prior to Visit  Medication Sig  . amiodarone (PACERONE) 200 MG tablet Take 1 tablet (200 mg total) by mouth 2 (two) times daily for 14 days, THEN 1 tablet (200 mg total) daily for 16 days.  . apixaban (ELIQUIS) 5 MG TABS tablet Take 1 tablet (5 mg total) by mouth 2 (two) times daily.  . calcium carbonate (TUMS - DOSED IN MG ELEMENTAL CALCIUM) 500 MG chewable tablet Chew 1-2 tablets by mouth daily as needed for indigestion or heartburn.  . cetirizine (ZYRTEC) 10 MG tablet Take 10 mg by mouth daily as needed for allergies.  . diphenhydramine-acetaminophen (TYLENOL PM) 25-500 MG TABS tablet Take 1 tablet by mouth at bedtime as needed (sleep).  . dorzolamide-timolol (COSOPT) 22.3-6.8 MG/ML ophthalmic solution Place 1 drop into the left eye 2 (two) times daily.  . fluticasone (FLONASE) 50 MCG/ACT nasal spray Place 1 spray into the nose 2 (two) times daily as needed for allergies.   . furosemide (LASIX) 20 MG tablet Take 1 tablet (20 mg total) by mouth as needed (for shortness of breath).  . levothyroxine (SYNTHROID, LEVOTHROID) 88 MCG tablet Take 88 mcg by mouth daily.  . losartan (COZAAR) 25 MG tablet Take 1 tablet (25 mg total) by mouth daily.  . Melatonin 10 MG CAPS Take 10 mg by mouth at bedtime as   needed (sleep).  . Menthol-Methyl Salicylate (SALONPAS PAIN RELIEF PATCH EX) Apply 1 application topically daily as needed (pain).  . metFORMIN (GLUCOPHAGE-XR) 500 MG 24 hr tablet Take 500 mg by mouth 2 (two) times daily.  . metoprolol succinate (TOPROL-XL) 50 MG 24 hr tablet Take 1 tablet (50 mg total) by mouth daily. Take with or immediately following a meal.  . nitroGLYCERIN (NITROSTAT) 0.4 MG SL tablet Place 1 tablet (0.4 mg total) under the tongue every 5 (five) minutes as needed for chest  pain.  . Polyethyl Glycol-Propyl Glycol (SYSTANE OP) Place 1 drop into both eyes daily as needed (dryness).  . rosuvastatin (CRESTOR) 20 MG tablet Take 1 tablet (20 mg total) by mouth daily.  . Skin Protectants, Misc. (PETROLEUM JELLY EX) Apply 1 application topically 2 (two) times daily. Apply to wound   No current facility-administered medications on file prior to visit.     Allergies:   Amoxicillin-pot clavulanate, Brimonidine, Lisinopril, Brimonidine tartrate-timolol, and Zolpidem   Social History   Tobacco Use  . Smoking status: Former Smoker  . Smokeless tobacco: Never Used  Substance Use Topics  . Alcohol use: Yes    Comment: several a day  . Drug use: Not on file    Family History: family history includes Cirrhosis in her mother; Heart attack in an other family member; Hypertension in her mother; Stroke in her mother.  ROS:   Please see the history of present illness.  Additional pertinent ROS otherwise unremarkable.   EKGs/Labs/Other Studies Reviewed:    The following studies were reviewed today: LHC 03/06/20  Prox RCA lesion is 30% stenosed.  There is severe left ventricular systolic dysfunction.  LV end diastolic pressure is normal.  The left ventricular ejection fraction is 25-35% by visual estimate.   1. Mild nonobstructive CAD 2. Severe LV dysfunction with pattern consistent with Takotsubo's cardiomyopathy. EF 30% 3. Normal LVEDP 4. New onset Atrial fibrillation compared to yesterday.  Plan: admit to telemetry. Will initiate IV heparin. May consider transition to NOAC tomorrow. Hold amlodipine due to hypotension. Switch atenolol to metoprolol. Check Echo.   Echo 03/06/20 1. Left ventricular ejection fraction, by estimation, is 25 to 30%. The  left ventricle has severely decreased function. The left ventricle  demonstrates global hypokinesis. Left ventricular diastolic function could  not be evaluated.  2. Right ventricular systolic function is  normal. The right ventricular  size is normal. There is normal pulmonary artery systolic pressure. The  estimated right ventricular systolic pressure is 29.8 mmHg.  3. Left atrial size was moderately dilated.  4. Right atrial size was mildly dilated.  5. There is no evidence of cardiac tamponade.  6. The mitral valve is normal in structure. Mild mitral valve  regurgitation. No evidence of mitral stenosis. Moderate mitral annular  calcification.  7. The aortic valve is normal in structure. Aortic valve regurgitation is  trivial. Mild aortic valve sclerosis is present, with no evidence of  aortic valve stenosis.  8. The inferior vena cava is normal in size with greater than 50%  respiratory variability, suggesting right atrial pressure of 3 mmHg.  EKG:  EKG is personally reviewed.  The ekg ordered today demonstrates atrial fibrillation/atypical atrial flutter Recent Labs: 03/06/2020: B Natriuretic Peptide 1,385.7; TSH 1.327 03/07/2020: BUN 16; Creatinine, Ser 0.88; Hemoglobin 11.5; Platelets 241; Potassium 3.9; Sodium 140  Recent Lipid Panel    Component Value Date/Time   CHOL 121 03/07/2020 1150   TRIG 111 03/07/2020 1150   HDL 48 03/07/2020   1150   CHOLHDL 2.5 03/07/2020 1150   VLDL 22 03/07/2020 1150   LDLCALC 51 03/07/2020 1150    Physical Exam:    VS:  BP 128/72 (BP Location: Left Arm, Patient Position: Sitting, Cuff Size: Normal)   Pulse 90   Ht 5' 2" (1.575 m)   Wt 151 lb (68.5 kg)   BMI 27.62 kg/m     Wt Readings from Last 3 Encounters:  04/06/20 151 lb (68.5 kg)  03/16/20 150 lb 3.2 oz (68.1 kg)  03/07/20 151 lb 10.8 oz (68.8 kg)    GEN: Well nourished, well developed in no acute distress HEENT: Normal, moist mucous membranes NECK: No JVD CARDIAC: irregularly irregular rhythm, normal S1 and S2, no rubs or gallops. No murmur. VASCULAR: Radial and DP pulses 2+ bilaterally. No carotid bruits RESPIRATORY:  Clear to auscultation without rales, wheezing or rhonchi   ABDOMEN: Soft, non-tender, non-distended MUSCULOSKELETAL:  Ambulates independently SKIN: Warm and dry, no edema NEUROLOGIC:  Alert and oriented x 3. No focal neuro deficits noted. PSYCHIATRIC:  Normal affect   ASSESSMENT:    1. Persistent atrial fibrillation (HCC)   2. NICM (nonischemic cardiomyopathy) (HCC)   3. Type 2 diabetes mellitus without complication, without long-term current use of insulin (HCC)   4. Hyperlipidemia, unspecified hyperlipidemia type   5. Nonocclusive coronary atherosclerosis of native coronary artery   6. Essential hypertension   7. Pre-procedure lab exam    PLAN:    Atrial fibrillation, now persistent from paroxysmal Nonischemic cardiomyopathy -cath showed reduced EF, nonischemic cardiomyopathy -echo confirmed reduced EF -tolerating low dose losartan, metoprolol succinate -has been loaded on amiodarone, still in atrial fib/atypical atrial flutter. We discussed cardioversion at length today, she is amenable. Will change to amiodarone 200 mg daily. -CHA2DS2/VAS Stroke Risk Points=  6  -has not missed any doses of apixaban -starting jardiance today, counseled on side effects. She would like to speak to the pharmacy about the costs before receiving, sent note with prescription  Hyperlipidemia: given nonocclusive CAD, needs statin, goal LDL <70 -lipids reviewed, LDL 51 -changed to rosuvastatin given nonobstructive CAD; reportedly this needs a prior authorization, but unclear why as it is generic  Type II diabetes: -A1c 6.7 -on metformin, starting SGLT2i as above  Cardiac risk counseling and prevention recommendations: -recommend heart healthy/Mediterranean diet, with whole grains, fruits, vegetable, fish, lean meats, nuts, and olive oil. Limit salt. -recommend moderate walking, 3-5 times/week for 30-50 minutes each session. Aim for at least 150 minutes.week. Goal should be pace of 3 miles/hours, or walking 1.5 miles in 30 minutes -recommend avoidance of  tobacco products. Avoid excess alcohol. -Additional risk factor control:  -Weight: BMI 27  Plan for follow up: 3 weeks  Acasia Skilton, MD, PhD [place]  [facility]    Medication Adjustments/Labs and Tests Ordered: Current medicines are reviewed at length with the patient today.  Concerns regarding medicines are outlined above.  Orders Placed This Encounter  Procedures  . Basic metabolic panel  . CBC  . EKG 12-Lead   Meds ordered this encounter  Medications  . amiodarone (PACERONE) 200 MG tablet    Sig: Take 1 tablet (200 mg total) by mouth daily.    Dispense:  90 tablet    Refill:  3  . empagliflozin (JARDIANCE) 10 MG TABS tablet    Sig: Take 1 tablet (10 mg total) by mouth daily before breakfast.    Dispense:  90 tablet    Refill:  3    Please call   patient before sending so that she knows her out of pocket cost before it ships.    Patient Instructions  Medication Instructions:  Start Jardiance 10 mg daily  *If you need a refill on your cardiac medications before your next appointment, please call your pharmacy*   Lab Work: Your physician recommends that you return for lab work today ( CBC, BMP)  If you have labs (blood work) drawn today and your tests are completely normal, you will receive your results only by: Marland Kitchen MyChart Message (if you have MyChart) OR . A paper copy in the mail If you have any lab test that is abnormal or we need to change your treatment, we will call you to review the results.   Testing/Procedures: [place] will need to have the coronavirus test completed prior to your procedure. An appointment has been made at 12:30 on Monday 04/10/20. This is a Drive Up Visit at [address], [place], [place] [address]. Please tell them that you are there for procedure testing. Stay in your car and someone will be with you shortly. Please make sure to have all other labs completed before this test because you  will need to stay quarantined until your procedure.      Follow-Up: At [facility], you and your health needs are our priority.  As part of our continuing mission to provide you with exceptional heart care, we have created designated Provider Care Teams.  These Care Teams include your primary Cardiologist (physician) and Advanced Practice Providers (APPs -  Physician Assistants and Nurse Practitioners) who all work together to provide you with the care you need, when you need it.  We recommend signing up for the patient portal called "MyChart".  Sign up information is provided on this After Visit Summary.  MyChart is used to connect with patients for Virtual Visits (Telemedicine).  Patients are able to view lab/test results, encounter notes, upcoming appointments, etc.  Non-urgent messages can be sent to your provider as well.   To learn more about what you can do with MyChart, go to [email or web address].    Your next appointment:   3 week(s)  The format for your next appointment:   In Person  Provider:   [name], MD   You are scheduled for a Cardioversion on Thursday 04/13/20 with Dr. Margaretann Loveless.  Please arrive at the [facility] (Main Entrance A) at [facility]: [address] [place], [place] [address] at 9 am  DIET: Nothing to eat or drink after midnight except a sip of water with medications (see medication instructions below)  Medication Instructions:  Continue your anticoagulant: Eliquis You will need to continue your anticoagulant after your procedure until you  are told by your  Provider that it is safe to stop   Labs: Today ( BMP, CBC)  You must have a responsible person to drive you home and stay in the waiting area during your procedure. Failure to do so could result in cancellation.  Bring your insurance cards.  *Special Note: Every effort is made to have your procedure done on time. Occasionally there are emergencies that occur at the  hospital that may cause delays. Please be patient if a delay does occur.      Signed, [name], MD PhD 04/06/2020  [place]

## 2020-04-06 NOTE — Patient Instructions (Signed)
Medication Instructions:  Start Jardiance 10 mg daily  *If you need a refill on your cardiac medications before your next appointment, please call your pharmacy*   Lab Work: Your physician recommends that you return for lab work today ( CBC, BMP)  If you have labs (blood work) drawn today and your tests are completely normal, you will receive your results only by: Marland Kitchen MyChart Message (if you have MyChart) OR . A paper copy in the mail If you have any lab test that is abnormal or we need to change your treatment, we will call you to review the results.   Testing/Procedures: Cardioversion  [facility]  You will need to have the coronavirus test completed prior to your procedure. An appointment has been made at 12:30 on Monday 04/10/20. This is a Drive Up Visit at [address] [place], [place], [place] [address]. Please tell them that you are there for procedure testing. Stay in your car and someone will be with you shortly. Please make sure to have all other labs completed before this test because you will need to stay quarantined until your procedure.      Follow-Up: At [facility], you and your health needs are our priority.  As part of our continuing mission to provide you with exceptional heart care, we have created designated Provider Care Teams.  These Care Teams include your primary Cardiologist (physician) and Advanced Practice Providers (APPs -  Physician Assistants and Nurse Practitioners) who all work together to provide you with the care you need, when you need it.  We recommend signing up for the patient portal called "MyChart".  Sign up information is provided on this After Visit Summary.  MyChart is used to connect with patients for Virtual Visits (Telemedicine).  Patients are able to view lab/test results, encounter notes, upcoming appointments, etc.  Non-urgent messages can be sent to your provider as well.   To learn more about what you can do with MyChart, go to  [email or web address].    Your next appointment:   3 week(s)  The format for your next appointment:   In Person  Provider:   Jodelle Red, MD   You are scheduled for a Cardioversion on Thursday 04/13/20 with Dr. Jacques Navy.  Please arrive at the [facility] (Main Entrance A) at [facility]: [address] [place], [place] [address] at 9 am  DIET: Nothing to eat or drink after midnight except a sip of water with medications (see medication instructions below)  Medication Instructions:  Continue your anticoagulant: Eliquis You will need to continue your anticoagulant after your procedure until you  are told by your  Provider that it is safe to stop   Labs: Today ( BMP, CBC)  You must have a responsible person to drive you home and stay in the waiting area during your procedure. Failure to do so could result in cancellation.  Bring your insurance cards.  *Special Note: Every effort is made to have your procedure done on time. Occasionally there are emergencies that occur at the hospital that may cause delays. Please be patient if a delay does occur.

## 2020-04-06 NOTE — H&P (View-Only) (Signed)
Cardiology Office Note:    Date:  04/06/2020   ID:  Cheryl Mora, DOB 02-Dec-1932, MRN [id number]  PCP:  [name], MD  Cardiologist:  [name], MD  Referring MD: [name], MD   CC: follow up  History of Present Illness:    Cheryl Mora is a 86 y.o. female with a hx of type II diabetes, hyperlipidemia, hypertension, hypothyroidism, paroxysmal atrial fibrillation who is seen for follow up today.   Cardiac history: I met her 03/02/20 as a new consult for DOE. Very concerning story, set up for outpatient cath. Cath 9/27 with NICM, reduced EF, and new afib RVR. Her medications were adjusted, and she spontaneously converted to sinus rhythm prior to discharge 03/07/20  Today: Here for close follow up visit. Tolerating amiodarone, doesn't feel any different. Brings logs of BP, HR, and weights. Able to walk the neighborhood without getting out of breath. Working on increasing her activity at home.  Blood pressures have ranged 99/78-145/100, HR 93-135. Weights steady 148-151 lbs. Has taken lasix twice since our last visit.   Now onto amiodarone once/daily.   We discussed atrial fibrillation, cardioversion, cardiomyopathy, nonobstructive CAD at length today. She has missed no doses of apixaban. We also discussed SGLT2i today. She got a [facility] from [facility] that rosuvastatin wasn't covered. Unclear why this is, as it is generic.  Denies chest pain, shortness of breath at rest or with normal exertion. No PND, orthopnea, LE edema or unexpected weight gain. No syncope or palpitations.  Past Medical History:  Diagnosis Date  . Abnormal EKG    suggestive of previous anterior wall myocardial infarction  . Diabetes mellitus ([place])   . Hypertension   . Hypothyroidism     Past Surgical History:  Procedure Laterality Date  . ABDOMINAL HYSTERECTOMY    . CATARACT EXTRACTION    . KNEE SURGERY    . LEFT HEART CATH AND CORONARY ANGIOGRAPHY N/A 03/06/2020   Procedure:  LEFT HEART CATH AND CORONARY ANGIOGRAPHY;  Surgeon: Martinique, Peter M, MD;  Location: [facility];  Service: Cardiovascular;  Laterality: N/A;    Current Medications: Current Outpatient Medications on File Prior to Visit  Medication Sig  . amiodarone (PACERONE) 200 MG tablet Take 1 tablet (200 mg total) by mouth 2 (two) times daily for 14 days, THEN 1 tablet (200 mg total) daily for 16 days.  Marland Kitchen apixaban (ELIQUIS) 5 MG TABS tablet Take 1 tablet (5 mg total) by mouth 2 (two) times daily.  . calcium carbonate (TUMS - DOSED IN MG ELEMENTAL CALCIUM) 500 MG chewable tablet Chew 1-2 tablets by mouth daily as needed for indigestion or heartburn.  . cetirizine (ZYRTEC) 10 MG tablet Take 10 mg by mouth daily as needed for allergies.  . diphenhydramine-acetaminophen (TYLENOL PM) 25-500 MG TABS tablet Take 1 tablet by mouth at bedtime as needed (sleep).  . dorzolamide-timolol (COSOPT) 22.3-6.8 MG/ML ophthalmic solution Place 1 drop into the left eye 2 (two) times daily.  . fluticasone (FLONASE) 50 MCG/ACT nasal spray Place 1 spray into the nose 2 (two) times daily as needed for allergies.   . furosemide (LASIX) 20 MG tablet Take 1 tablet (20 mg total) by mouth as needed (for shortness of breath).  Marland Kitchen levothyroxine (SYNTHROID, LEVOTHROID) 88 MCG tablet Take 88 mcg by mouth daily.  Marland Kitchen losartan (COZAAR) 25 MG tablet Take 1 tablet (25 mg total) by mouth daily.  . Melatonin 10 MG CAPS Take 10 mg by mouth at bedtime as  needed (sleep).  . Menthol-Methyl Salicylate (SALONPAS PAIN RELIEF PATCH EX) Apply 1 application topically daily as needed (pain).  . metFORMIN (GLUCOPHAGE-XR) 500 MG 24 hr tablet Take 500 mg by mouth 2 (two) times daily.  . metoprolol succinate (TOPROL-XL) 50 MG 24 hr tablet Take 1 tablet (50 mg total) by mouth daily. Take with or immediately following a meal.  . nitroGLYCERIN (NITROSTAT) 0.4 MG SL tablet Place 1 tablet (0.4 mg total) under the tongue every 5 (five) minutes as needed for chest  pain.  Cheryl Mora Glycol-Propyl Glycol (SYSTANE OP) Place 1 drop into both eyes daily as needed (dryness).  . rosuvastatin (CRESTOR) 20 MG tablet Take 1 tablet (20 mg total) by mouth daily.  . Skin Protectants, Misc. (PETROLEUM JELLY EX) Apply 1 application topically 2 (two) times daily. Apply to wound   No current facility-administered medications on file prior to visit.     Allergies:   Amoxicillin-pot clavulanate, Brimonidine, Lisinopril, Brimonidine tartrate-timolol, and Zolpidem   Social History   Tobacco Use  . Smoking status: Former Research scientist (life sciences)  . Smokeless tobacco: Never Used  Substance Use Topics  . Alcohol use: Yes    Comment: several a day  . Drug use: Not on file    Family History: family history includes Cirrhosis in her mother; Heart attack in an other family member; Hypertension in her mother; Stroke in her mother.  ROS:   Please see the history of present illness.  Additional pertinent ROS otherwise unremarkable.   EKGs/Labs/Other Studies Reviewed:    The following studies were reviewed today: [facility] Mar 24, 2020  Prox RCA lesion is 30% stenosed.  There is severe left ventricular systolic dysfunction.  LV end diastolic pressure is normal.  The left ventricular ejection fraction is 25-35% by visual estimate.   1. Mild nonobstructive CAD 2. Severe LV dysfunction with pattern consistent with Takotsubo's cardiomyopathy. EF 30% 3. Normal LVEDP 4. New onset Atrial fibrillation compared to yesterday.  Plan: admit to telemetry. Will initiate IV heparin. May consider transition to NOAC tomorrow. Hold amlodipine due to hypotension. Switch atenolol to metoprolol. Check Echo.   Echo 03/24/20 1. Left ventricular ejection fraction, by estimation, is 25 to 30%. The  left ventricle has severely decreased function. The left ventricle  demonstrates global hypokinesis. Left ventricular diastolic function could  not be evaluated.  2. Right ventricular systolic function is  normal. The right ventricular  size is normal. There is normal pulmonary artery systolic pressure. The  estimated right ventricular systolic pressure is 83.6 mmHg.  3. Left atrial size was moderately dilated.  4. Right atrial size was mildly dilated.  5. There is no evidence of cardiac tamponade.  6. The mitral valve is normal in structure. Mild mitral valve  regurgitation. No evidence of mitral stenosis. Moderate mitral annular  calcification.  7. The aortic valve is normal in structure. Aortic valve regurgitation is  trivial. Mild aortic valve sclerosis is present, with no evidence of  aortic valve stenosis.  8. The inferior vena cava is normal in size with greater than 50%  respiratory variability, suggesting right atrial pressure of 3 mmHg.  EKG:  EKG is personally reviewed.  The ekg ordered today demonstrates atrial fibrillation/atypical atrial flutter Recent Labs: 03-24-20: B Natriuretic Peptide 1,385.7; TSH 1.327 03/07/2020: BUN 16; Creatinine, Ser 0.88; Hemoglobin 11.5; Platelets 241; Potassium 3.9; Sodium 140  Recent Lipid Panel    Component Value Date/Time   CHOL 121 03/07/2020 1150   TRIG 111 03/07/2020 1150   HDL 48 03/07/2020  1150   CHOLHDL 2.5 03/07/2020 1150   VLDL 22 03/07/2020 1150   LDLCALC 51 03/07/2020 1150    Physical Exam:    VS:  BP 128/72 (BP Location: Left Arm, Patient Position: Sitting, Cuff Size: Normal)   Pulse 90   Ht 5' 2" (1.575 m)   Wt 151 lb (68.5 kg)   BMI 27.62 kg/m     Wt Readings from Last 3 Encounters:  04/06/20 151 lb (68.5 kg)  03/16/20 150 lb 3.2 oz (68.1 kg)  03/07/20 151 lb 10.8 oz (68.8 kg)    GEN: Well nourished, well developed in no acute distress HEENT: Normal, moist mucous membranes NECK: No JVD CARDIAC: irregularly irregular rhythm, normal S1 and S2, no rubs or gallops. No murmur. VASCULAR: Radial and DP pulses 2+ bilaterally. No carotid bruits RESPIRATORY:  Clear to auscultation without rales, wheezing or rhonchi   ABDOMEN: Soft, non-tender, non-distended MUSCULOSKELETAL:  Ambulates independently SKIN: Warm and dry, no edema NEUROLOGIC:  Alert and oriented x 3. No focal neuro deficits noted. PSYCHIATRIC:  Normal affect   ASSESSMENT:    1. Persistent atrial fibrillation ([place])   2. NICM (nonischemic cardiomyopathy) ([place])   3. Type 2 diabetes mellitus without complication, without long-term current use of insulin ([place])   4. Hyperlipidemia, unspecified hyperlipidemia type   5. Nonocclusive coronary atherosclerosis of native coronary artery   6. Essential hypertension   7. Pre-procedure lab exam    PLAN:    Atrial fibrillation, now persistent from paroxysmal Nonischemic cardiomyopathy -cath showed reduced EF, nonischemic cardiomyopathy -echo confirmed reduced EF -tolerating low dose losartan, metoprolol succinate -has been loaded on amiodarone, still in atrial fib/atypical atrial flutter. We discussed cardioversion at length today, she is amenable. Will change to amiodarone 200 mg daily. -CHA2DS2/VAS Stroke Risk Points=  6  -has not missed any doses of apixaban -starting jardiance today, counseled on side effects. She would like to speak to the pharmacy about the costs before receiving, sent note with prescription  Hyperlipidemia: given nonocclusive CAD, needs statin, goal LDL <70 -lipids reviewed, LDL 51 -changed to rosuvastatin given nonobstructive CAD; reportedly this needs a prior authorization, but unclear why as it is generic  Type II diabetes: -A1c 6.7 -on metformin, starting SGLT2i as above  Cardiac risk counseling and prevention recommendations: -recommend heart healthy/Mediterranean diet, with whole grains, fruits, vegetable, fish, lean meats, nuts, and olive oil. Limit salt. -recommend moderate walking, 3-5 times/week for 30-50 minutes each session. Aim for at least 150 minutes.week. Goal should be pace of 3 miles/hours, or walking 1.5 miles in 30 minutes -recommend avoidance of  tobacco products. Avoid excess alcohol. -Additional risk factor control:  -Weight: BMI 27  Plan for follow up: 3 weeks  [name], MD, PhD [place]  [facility]    Medication Adjustments/Labs and Tests Ordered: Current medicines are reviewed at length with the patient today.  Concerns regarding medicines are outlined above.  Orders Placed This Encounter  Procedures  . Basic metabolic panel  . CBC  . EKG 12-Lead   Meds ordered this encounter  Medications  . amiodarone (PACERONE) 200 MG tablet    Sig: Take 1 tablet (200 mg total) by mouth daily.    Dispense:  90 tablet    Refill:  3  . empagliflozin (JARDIANCE) 10 MG TABS tablet    Sig: Take 1 tablet (10 mg total) by mouth daily before breakfast.    Dispense:  90 tablet    Refill:  3    Please call  patient before sending so that she knows her out of pocket cost before it ships.    Patient Instructions  Medication Instructions:  Start Jardiance 10 mg daily  *If you need a refill on your cardiac medications before your next appointment, please call your pharmacy*   Lab Work: Your physician recommends that you return for lab work today ( CBC, BMP)  If you have labs (blood work) drawn today and your tests are completely normal, you will receive your results only by: Marland Kitchen MyChart Message (if you have MyChart) OR . A paper copy in the mail If you have any lab test that is abnormal or we need to change your treatment, we will call you to review the results.   Testing/Procedures: [place] will need to have the coronavirus test completed prior to your procedure. An appointment has been made at 12:30 on Monday 04/10/20. This is a Drive Up Visit at [address], [place], [place] [address]. Please tell them that you are there for procedure testing. Stay in your car and someone will be with you shortly. Please make sure to have all other labs completed before this test because you  will need to stay quarantined until your procedure.      Follow-Up: At [facility], you and your health needs are our priority.  As part of our continuing mission to provide you with exceptional heart care, we have created designated Provider Care Teams.  These Care Teams include your primary Cardiologist (physician) and Advanced Practice Providers (APPs -  Physician Assistants and Nurse Practitioners) who all work together to provide you with the care you need, when you need it.  We recommend signing up for the patient portal called "MyChart".  Sign up information is provided on this After Visit Summary.  MyChart is used to connect with patients for Virtual Visits (Telemedicine).  Patients are able to view lab/test results, encounter notes, upcoming appointments, etc.  Non-urgent messages can be sent to your provider as well.   To learn more about what you can do with MyChart, go to [email or web address].    Your next appointment:   3 week(s)  The format for your next appointment:   In Person  Provider:   [name], MD   You are scheduled for a Cardioversion on Thursday 04/13/20 with Dr. Margaretann Loveless.  Please arrive at the [facility] (Main Entrance A) at [facility]: [address] [place], [place] [address] at 9 am  DIET: Nothing to eat or drink after midnight except a sip of water with medications (see medication instructions below)  Medication Instructions:  Continue your anticoagulant: Eliquis You will need to continue your anticoagulant after your procedure until you  are told by your  Provider that it is safe to stop   Labs: Today ( BMP, CBC)  You must have a responsible person to drive you home and stay in the waiting area during your procedure. Failure to do so could result in cancellation.  Bring your insurance cards.  *Special Note: Every effort is made to have your procedure done on time. Occasionally there are emergencies that occur at the  hospital that may cause delays. Please be patient if a delay does occur.      Signed, [name], MD PhD 04/06/2020  [place]

## 2020-04-10 ENCOUNTER — Other Ambulatory Visit (HOSPITAL_COMMUNITY)
Admission: RE | Admit: 2020-04-10 | Discharge: 2020-04-10 | Disposition: A | Discharge status: Home / Self Care | Payer: Medicare HMO | Source: Ambulatory Visit | Attending: Internal Medicine | Admitting: Internal Medicine

## 2020-04-10 DIAGNOSIS — Z01818 Encounter for other preprocedural examination: Secondary | ICD-10-CM | POA: Diagnosis present

## 2020-04-10 DIAGNOSIS — Z20822 Contact with and (suspected) exposure to covid-19: Secondary | ICD-10-CM | POA: Diagnosis not present

## 2020-04-10 LAB — SARS CORONAVIRUS 2 (TAT 6-24 HRS): SARS Coronavirus 2: NEGATIVE

## 2020-04-11 ENCOUNTER — Telehealth: Payer: Self-pay

## 2020-04-11 NOTE — Telephone Encounter (Signed)
Received fax for [facility] stating Rosuvastatin 20 mg was approved through 06/09/21.

## 2020-04-13 ENCOUNTER — Encounter: Payer: Self-pay | Admitting: Cardiology

## 2020-04-13 ENCOUNTER — Encounter (HOSPITAL_COMMUNITY)
Admission: RE | Disposition: A | Discharge status: Home / Self Care | Payer: Self-pay | Source: Home / Self Care | Attending: Internal Medicine

## 2020-04-13 ENCOUNTER — Ambulatory Visit (HOSPITAL_COMMUNITY): Payer: Medicare HMO | Admitting: Anesthesiology

## 2020-04-13 ENCOUNTER — Encounter (HOSPITAL_COMMUNITY): Payer: Self-pay | Admitting: Internal Medicine

## 2020-04-13 ENCOUNTER — Ambulatory Visit (HOSPITAL_COMMUNITY)
Admission: RE | Admit: 2020-04-13 | Discharge: 2020-04-13 | Disposition: A | Discharge status: Home / Self Care | Payer: Medicare HMO | Attending: Internal Medicine | Admitting: Internal Medicine

## 2020-04-13 ENCOUNTER — Other Ambulatory Visit: Payer: Self-pay

## 2020-04-13 DIAGNOSIS — I4819 Other persistent atrial fibrillation: Secondary | ICD-10-CM | POA: Diagnosis present

## 2020-04-13 DIAGNOSIS — I428 Other cardiomyopathies: Secondary | ICD-10-CM | POA: Insufficient documentation

## 2020-04-13 DIAGNOSIS — I2510 Atherosclerotic heart disease of native coronary artery without angina pectoris: Secondary | ICD-10-CM | POA: Diagnosis not present

## 2020-04-13 DIAGNOSIS — Z880 Allergy status to penicillin: Secondary | ICD-10-CM | POA: Insufficient documentation

## 2020-04-13 DIAGNOSIS — Z87891 Personal history of nicotine dependence: Secondary | ICD-10-CM | POA: Insufficient documentation

## 2020-04-13 DIAGNOSIS — E119 Type 2 diabetes mellitus without complications: Secondary | ICD-10-CM | POA: Diagnosis not present

## 2020-04-13 DIAGNOSIS — E039 Hypothyroidism, unspecified: Secondary | ICD-10-CM | POA: Insufficient documentation

## 2020-04-13 DIAGNOSIS — I484 Atypical atrial flutter: Secondary | ICD-10-CM | POA: Diagnosis not present

## 2020-04-13 DIAGNOSIS — Z888 Allergy status to other drugs, medicaments and biological substances status: Secondary | ICD-10-CM | POA: Diagnosis not present

## 2020-04-13 DIAGNOSIS — E785 Hyperlipidemia, unspecified: Secondary | ICD-10-CM | POA: Diagnosis not present

## 2020-04-13 DIAGNOSIS — I10 Essential (primary) hypertension: Secondary | ICD-10-CM | POA: Insufficient documentation

## 2020-04-13 HISTORY — PX: CARDIOVERSION: SHX1299

## 2020-04-13 SURGERY — CARDIOVERSION
Anesthesia: General

## 2020-04-13 MED ORDER — SODIUM CHLORIDE 0.9 % IV SOLN
INTRAVENOUS | Status: DC
  Administered 2020-04-13: 500 mL via INTRAVENOUS

## 2020-04-13 MED ORDER — LIDOCAINE 2% (20 MG/ML) 5 ML SYRINGE
INTRAMUSCULAR | Status: DC | PRN
  Administered 2020-04-13: 60 mg via INTRAVENOUS

## 2020-04-13 MED ORDER — PROPOFOL 10 MG/ML IV BOLUS
INTRAVENOUS | Status: DC | PRN
  Administered 2020-04-13: 60 mg via INTRAVENOUS

## 2020-04-13 NOTE — Interval H&P Note (Signed)
History and Physical Interval Note:  04/13/2020 9:56 AM  [name]  has presented today for surgery, with the diagnosis of AFIB.  The various methods of treatment have been discussed with the patient and family. After consideration of risks, benefits and other options for treatment, the patient has consented to  Procedure(s): CARDIOVERSION (N/A) as a surgical intervention.  The patient's history has been reviewed, patient examined, no change in status, stable for surgery.  I have reviewed the patient's chart and labs.  Questions were answered to the patient's satisfaction.     [name]

## 2020-04-13 NOTE — CV Procedure (Signed)
Procedure: Electrical Cardioversion Indications:  Atrial Fibrillation  Procedure Details:  Consent: Risks of procedure as well as the alternatives and risks of each were explained to the (patient/caregiver).  Consent for procedure obtained.  Time Out: Verified patient identification, verified procedure, site/side was marked, verified correct patient position, special equipment/implants available, medications/allergies/relevent history reviewed, required imaging and test results available. PERFORMED.  Patient placed on cardiac monitor, pulse oximetry, supplemental oxygen as necessary.  Sedation given: propofol per anesthesia Pacer pads placed anterior and posterior chest.  Cardioverted 1 time(s).  Cardioversion with synchronized biphasic 120J shock.  Evaluation: Findings: Post procedure EKG shows: NSR Complications: None Patient did tolerate procedure well.  Time Spent Directly with the Patient:  30 minutes   [name] 04/13/2020, 10:20 AM

## 2020-04-13 NOTE — Anesthesia Preprocedure Evaluation (Signed)
Anesthesia Evaluation  Patient identified by MRN, date of birth, ID band Patient awake    Reviewed: Allergy & Precautions, H&P , NPO status , Patient's Chart, lab work & pertinent test results  Airway Mallampati: II   Neck ROM: full    Dental   Pulmonary former smoker,    breath sounds clear to auscultation       Cardiovascular hypertension, + dysrhythmias Atrial Fibrillation  Rhythm:irregular Rate:Normal     Neuro/Psych    GI/Hepatic   Endo/Other  diabetes, Type 2Hypothyroidism   Renal/GU      Musculoskeletal   Abdominal   Peds  Hematology   Anesthesia Other Findings   Reproductive/Obstetrics                             Anesthesia Physical Anesthesia Plan  ASA: III  Anesthesia Plan: General   Post-op Pain Management:    Induction: Intravenous  PONV Risk Score and Plan: 3 and Propofol infusion and Treatment may vary due to age or medical condition  Airway Management Planned: Mask  Additional Equipment:   Intra-op Plan:   Post-operative Plan:   Informed Consent: I have reviewed the patients History and Physical, chart, labs and discussed the procedure including the risks, benefits and alternatives for the proposed anesthesia with the patient or authorized representative who has indicated his/her understanding and acceptance.       Plan Discussed with: CRNA, Anesthesiologist and Surgeon  Anesthesia Plan Comments:         Anesthesia Quick Evaluation

## 2020-04-13 NOTE — Discharge Instructions (Signed)
Electrical Cardioversion Electrical cardioversion is the delivery of a jolt of electricity to restore a normal rhythm to the heart. A rhythm that is too fast or is not regular keeps the heart from pumping well. In this procedure, sticky patches or metal paddles are placed on the chest to deliver electricity to the heart from a device.  Follow these instructions at home:  You may have some redness on the skin where the shocks were given.  Do not drive for 24 hours if you were given a sedative during your procedure.  Take over-the-counter and prescription medicines only as told by your health care provider.  Ask your health care provider how to check your pulse. Check it often.  Rest for 48 hours after the procedure or as told by your health care provider.  Avoid or limit your caffeine use as told by your health care provider.  Keep all follow-up visits as told by your health care provider. This is important. Contact a health care provider if:  You feel like your heart is beating too quickly or your pulse is not regular.  You have a serious muscle cramp that does not go away. Get help right away if:  You have discomfort in your chest.  You are dizzy or you feel faint.  You have trouble breathing or you are short of breath.  Your speech is slurred.  You have trouble moving an arm or leg on one side of your body.  Your fingers or toes turn cold or blue. Summary  Electrical cardioversion is the delivery of a jolt of electricity to restore a normal rhythm to the heart.  This procedure may be done right away in an emergency or may be a scheduled procedure if the condition is not an emergency.  Generally, this is a safe procedure.  After the procedure, check your pulse often as told by your health care provider. This information is not intended to replace advice given to you by your health care provider. Make sure you discuss any questions you have with your health care  provider. Document Revised: 12/28/2018 Document Reviewed: 12/28/2018 Elsevier Patient Education  2020 [employer].

## 2020-04-13 NOTE — Anesthesia Procedure Notes (Signed)
Procedure Name: General with mask airway Date/Time: 04/13/2020 10:10 AM Performed by: Colon Flattery, CRNA Pre-anesthesia Checklist: Patient identified, Emergency Drugs available, Suction available and Patient being monitored Patient Re-evaluated:Patient Re-evaluated prior to induction Oxygen Delivery Method: Ambu bag Preoxygenation: Pre-oxygenation with 100% oxygen Induction Type: IV induction Placement Confirmation: positive ETCO2 Dental Injury: Teeth and Oropharynx as per pre-operative assessment

## 2020-04-13 NOTE — Transfer of Care (Signed)
Immediate Anesthesia Transfer of Care Note  Patient: Cheryl Mora  Procedure(s) Performed: CARDIOVERSION (N/A )  Patient Location: Endoscopy Unit  Anesthesia Type:General  Level of Consciousness: awake, alert  and oriented  Airway & Oxygen Therapy: Patient Spontanous Breathing and Patient connected to nasal cannula oxygen  Post-op Assessment: Report given to RN, Post -op Vital signs reviewed and stable and Patient moving all extremities  Post vital signs: Reviewed and stable  Last Vitals:  Vitals Value Taken Time  BP    Temp    Pulse 54 04/13/20 1029  Resp 19 04/13/20 1029  SpO2 99 % 04/13/20 1029  Vitals shown include unvalidated device data.  Last Pain:  Vitals:   04/13/20 1023  TempSrc:   PainSc: Asleep         Complications: No complications documented.

## 2020-04-14 NOTE — Anesthesia Postprocedure Evaluation (Signed)
Anesthesia Post Note  Patient: Cheryl Mora  Procedure(s) Performed: CARDIOVERSION (N/A )     Patient location during evaluation: Endoscopy Anesthesia Type: General Level of consciousness: awake and alert Pain management: pain level controlled Vital Signs Assessment: post-procedure vital signs reviewed and stable Respiratory status: spontaneous breathing, nonlabored ventilation, respiratory function stable and patient connected to nasal cannula oxygen Cardiovascular status: blood pressure returned to baseline and stable Postop Assessment: no apparent nausea or vomiting Anesthetic complications: no   No complications documented.  Last Vitals:  Vitals:   04/13/20 1033 04/13/20 1041  BP: 105/65 138/66  Pulse: (!) 55 61  Resp: 17 18  Temp:    SpO2: 95% 96%    Last Pain:  Vitals:   04/13/20 1041  TempSrc:   PainSc: 0-No pain                 Kathaleen Dudziak S

## 2020-04-17 ENCOUNTER — Encounter (HOSPITAL_COMMUNITY): Payer: Self-pay | Admitting: Internal Medicine

## 2020-05-01 ENCOUNTER — Encounter: Payer: Self-pay | Admitting: Cardiology

## 2020-05-01 ENCOUNTER — Ambulatory Visit: Payer: Medicare HMO | Admitting: Cardiology

## 2020-05-01 ENCOUNTER — Other Ambulatory Visit: Payer: Self-pay

## 2020-05-01 VITALS — BP 128/76 | HR 104 | Ht 62.0 in | Wt 147.0 lb

## 2020-05-01 DIAGNOSIS — I4892 Unspecified atrial flutter: Secondary | ICD-10-CM

## 2020-05-01 DIAGNOSIS — E119 Type 2 diabetes mellitus without complications: Secondary | ICD-10-CM

## 2020-05-01 DIAGNOSIS — I4819 Other persistent atrial fibrillation: Secondary | ICD-10-CM

## 2020-05-01 DIAGNOSIS — E785 Hyperlipidemia, unspecified: Secondary | ICD-10-CM

## 2020-05-01 DIAGNOSIS — I428 Other cardiomyopathies: Secondary | ICD-10-CM | POA: Diagnosis not present

## 2020-05-01 DIAGNOSIS — I2510 Atherosclerotic heart disease of native coronary artery without angina pectoris: Secondary | ICD-10-CM

## 2020-05-01 DIAGNOSIS — I10 Essential (primary) hypertension: Secondary | ICD-10-CM

## 2020-05-01 NOTE — Patient Instructions (Addendum)
Medication Instructions:  Your Physician recommend you continue on your current medication as directed.    *If you need a refill on your cardiac medications before your next appointment, please call your pharmacy*   Lab Work: None  Testing/Procedures: None   Follow-Up: At [facility], you and your health needs are our priority.  As part of our continuing mission to provide you with exceptional heart care, we have created designated Provider Care Teams.  These Care Teams include your primary Cardiologist (physician) and Advanced Practice Providers (APPs -  Physician Assistants and Nurse Practitioners) who all work together to provide you with the care you need, when you need it.  We recommend signing up for the patient portal called "MyChart".  Sign up information is provided on this After Visit Summary.  MyChart is used to connect with patients for Virtual Visits (Telemedicine).  Patients are able to view lab/test results, encounter notes, upcoming appointments, etc.  Non-urgent messages can be sent to your provider as well.   To learn more about what you can do with MyChart, go to [email or web address].    Your next appointment:   6-8 week(s)  The format for your next appointment:   In Person  Provider:   Bridgette Christopher, MD     

## 2020-05-01 NOTE — Progress Notes (Signed)
Cardiology Office Note:    Date:  05/01/2020   ID:  Cheryl Mora, DOB 06-21-1932, MRN [id number]  PCP:  [name], MD  Cardiologist:  [name], MD  Referring MD: [name], MD   CC: follow up  History of Present Illness:    Cheryl Mora is a 86 y.o. female with a hx of type II diabetes, hyperlipidemia, hypertension, hypothyroidism, paroxysmal atrial fibrillation who is seen for follow up today.   Cardiac history: I met her 03/02/20 as a new consult for DOE. Very concerning story, set up for outpatient cath. Cath 9/27 with NICM, reduced EF, and new afib RVR. Her medications were adjusted, and she spontaneously converted to sinus rhythm prior to discharge 03/07/20  Today: Cardioversion did not hold for long. She feels fine, went to the beach yesterday without issues. Discussed options for further management. She does feel like her energy level is chronically low in recent times.   Scared of jardiance, though taking it. We discussed this at length. She paid $125 out of pocket for it. Had another medication that was also expensive.   Does bruise/bleed easily. Has sensitivity on her skin. Discussed exercise options and guidelines today.  Reviewed log of home BP/heart rates. Range from 90/68-150/98, HR 61-120.  We discussed options for management of her afib/flutter. After discussion, we will meet back in mid-January and reassess. Offered referral to EP now, but she would like to meet back and discuss.   Denies chest pain, shortness of breath at rest or with normal exertion. No PND, orthopnea, LE edema or unexpected weight gain. No syncope or palpitations.  Past Medical History:  Diagnosis Date  . Abnormal EKG    suggestive of previous anterior wall myocardial infarction  . Diabetes mellitus ([place])   . Hypertension   . Hypothyroidism     Past Surgical History:  Procedure Laterality Date  . ABDOMINAL HYSTERECTOMY    . CARDIOVERSION N/A 04/13/2020    Procedure: CARDIOVERSION;  Surgeon: Elouise Munroe, MD;  Location: [facility] ENDOSCOPY;  Service: Cardiovascular;  Laterality: N/A;  . CATARACT EXTRACTION    . KNEE SURGERY    . LEFT HEART CATH AND CORONARY ANGIOGRAPHY N/A 03/06/2020   Procedure: LEFT HEART CATH AND CORONARY ANGIOGRAPHY;  Surgeon: Martinique, Peter M, MD;  Location: [facility] LAB;  Service: Cardiovascular;  Laterality: N/A;    Current Medications: Current Outpatient Medications on File Prior to Visit  Medication Sig  . amiodarone (PACERONE) 200 MG tablet Take 1 tablet (200 mg total) by mouth daily.  Marland Kitchen apixaban (ELIQUIS) 5 MG TABS tablet Take 1 tablet (5 mg total) by mouth 2 (two) times daily.  . calcium carbonate (TUMS - DOSED IN MG ELEMENTAL CALCIUM) 500 MG chewable tablet Chew 1-2 tablets by mouth daily as needed for indigestion or heartburn.  . cetirizine (ZYRTEC) 10 MG tablet Take 10 mg by mouth daily as needed for allergies.  . diphenhydrAMINE (BENADRYL) 25 MG tablet Take 25 mg by mouth every 6 (six) hours as needed for allergies.  . diphenhydramine-acetaminophen (TYLENOL PM) 25-500 MG TABS tablet Take 1 tablet by mouth at bedtime as needed (sleep).  . dorzolamide-timolol (COSOPT) 22.3-6.8 MG/ML ophthalmic solution Place 1 drop into the left eye 2 (two) times daily.  . empagliflozin (JARDIANCE) 10 MG TABS tablet Take 1 tablet (10 mg total) by mouth daily before breakfast.  . fluticasone (FLONASE) 50 MCG/ACT nasal spray Place 1 spray into the nose 2 (two) times daily as needed for  allergies.   . furosemide (LASIX) 20 MG tablet Take 1 tablet (20 mg total) by mouth as needed (for shortness of breath). (Patient taking differently: Take 20 mg by mouth daily as needed (for shortness of breath). )  . levothyroxine (SYNTHROID, LEVOTHROID) 88 MCG tablet Take 88 mcg by mouth daily.  Marland Kitchen losartan (COZAAR) 25 MG tablet Take 1 tablet (25 mg total) by mouth daily.  . Melatonin 10 MG CAPS Take 10 mg by mouth at bedtime as needed (sleep).   . Menthol-Methyl Salicylate (SALONPAS PAIN RELIEF PATCH EX) Apply 1 application topically daily as needed (pain).  . metFORMIN (GLUCOPHAGE-XR) 500 MG 24 hr tablet Take 500 mg by mouth 2 (two) times daily.  . metoprolol succinate (TOPROL-XL) 50 MG 24 hr tablet Take 1 tablet (50 mg total) by mouth daily. Take with or immediately following a meal.  . nitroGLYCERIN (NITROSTAT) 0.4 MG SL tablet Place 1 tablet (0.4 mg total) under the tongue every 5 (five) minutes as needed for chest pain.  Vladimir Faster Glycol-Propyl Glycol (SYSTANE OP) Place 1 drop into both eyes daily as needed (dryness).  . rosuvastatin (CRESTOR) 20 MG tablet Take 1 tablet (20 mg total) by mouth daily.   No current facility-administered medications on file prior to visit.     Allergies:   Amoxicillin-pot clavulanate, Brimonidine, Lisinopril, Brimonidine tartrate-timolol, and Zolpidem   Social History   Tobacco Use  . Smoking status: Former Research scientist (life sciences)  . Smokeless tobacco: Never Used  Vaping Use  . Vaping Use: Never used  Substance Use Topics  . Alcohol use: Yes    Comment: several a day  . Drug use: Never    Family History: family history includes Cirrhosis in her mother; Heart attack in an other family member; Hypertension in her mother; Stroke in her mother.  ROS:   Please see the history of present illness.  Additional pertinent ROS otherwise unremarkable.   EKGs/Labs/Other Studies Reviewed:    The following studies were reviewed today: LHC 2020/03/12  Prox RCA lesion is 30% stenosed.  There is severe left ventricular systolic dysfunction.  LV end diastolic pressure is normal.  The left ventricular ejection fraction is 25-35% by visual estimate.   1. Mild nonobstructive CAD 2. Severe LV dysfunction with pattern consistent with Takotsubo's cardiomyopathy. EF 30% 3. Normal LVEDP 4. New onset Atrial fibrillation compared to yesterday.  Plan: admit to telemetry. Will initiate IV heparin. May consider  transition to NOAC tomorrow. Hold amlodipine due to hypotension. Switch atenolol to metoprolol. Check Echo.   Echo 2020/03/12 1. Left ventricular ejection fraction, by estimation, is 25 to 30%. The  left ventricle has severely decreased function. The left ventricle  demonstrates global hypokinesis. Left ventricular diastolic function could  not be evaluated.  2. Right ventricular systolic function is normal. The right ventricular  size is normal. There is normal pulmonary artery systolic pressure. The  estimated right ventricular systolic pressure is 47.8 mmHg.  3. Left atrial size was moderately dilated.  4. Right atrial size was mildly dilated.  5. There is no evidence of cardiac tamponade.  6. The mitral valve is normal in structure. Mild mitral valve  regurgitation. No evidence of mitral stenosis. Moderate mitral annular  calcification.  7. The aortic valve is normal in structure. Aortic valve regurgitation is  trivial. Mild aortic valve sclerosis is present, with no evidence of  aortic valve stenosis.  8. The inferior vena cava is normal in size with greater than 50%  respiratory variability, suggesting right atrial  pressure of 3 mmHg.  EKG:  EKG is personally reviewed.  The ekg ordered today demonstrates atypical atrial flutter at 104 bpm  Recent Labs: 03/06/2020: B Natriuretic Peptide 1,385.7; TSH 1.327 04/06/2020: BUN 17; Creatinine, Ser 1.16; Hemoglobin 12.5; Platelets 224; Potassium 4.6; Sodium 143  Recent Lipid Panel    Component Value Date/Time   CHOL 121 03/07/2020 1150   TRIG 111 03/07/2020 1150   HDL 48 03/07/2020 1150   CHOLHDL 2.5 03/07/2020 1150   VLDL 22 03/07/2020 1150   LDLCALC 51 03/07/2020 1150    Physical Exam:    VS:  BP 128/76 (BP Location: Left Arm, Patient Position: Sitting, Cuff Size: Normal)   Pulse (!) 104   Ht $R'5\' 2"'gh$  (1.575 m)   Wt 147 lb (66.7 kg)   BMI 26.89 kg/m     Wt Readings from Last 3 Encounters:  05/01/20 147 lb (66.7 kg)   04/06/20 151 lb (68.5 kg)  03/16/20 150 lb 3.2 oz (68.1 kg)    GEN: Well nourished, well developed in no acute distress HEENT: Normal, moist mucous membranes NECK: No JVD CARDIAC: irregularly irregular rhythm, normal S1 and S2, no rubs or gallops. No murmur. VASCULAR: Radial and DP pulses 2+ bilaterally. No carotid bruits RESPIRATORY:  Clear to auscultation without rales, wheezing or rhonchi  ABDOMEN: Soft, non-tender, non-distended MUSCULOSKELETAL:  Ambulates independently SKIN: Warm and dry, no edema NEUROLOGIC:  Alert and oriented x 3. No focal neuro deficits noted. PSYCHIATRIC:  Normal affect   ASSESSMENT:    1. Atrial flutter, unspecified type ([place])   2. Persistent atrial fibrillation ([place])   3. NICM (nonischemic cardiomyopathy) ([place])   4. Type 2 diabetes mellitus without complication, without long-term current use of insulin ([place])   5. Essential hypertension   6. Nonocclusive coronary atherosclerosis of native coronary artery   7. Hyperlipidemia, unspecified hyperlipidemia type    PLAN:    Atrial fibrillation, now persistent from paroxysmal Atypical atrial flutter Nonischemic cardiomyopathy -cath showed reduced EF, nonischemic cardiomyopathy -echo confirmed reduced EF -tolerating low dose losartan, metoprolol succinate -on amiodarone -did not hold long after cardioversion -discussed EP referral today to discuss ablation. She will consider but would like to meet back first -CHA2DS2/VAS Stroke Risk Points=  6  -wide range of HR, will contact me if persistently elevated  Hyperlipidemia: given nonocclusive CAD, needs statin, goal LDL <70 -lipids reviewed, LDL 51 -changed to rosuvastatin given nonobstructive CAD; reportedly this needs a prior authorization, but unclear why as it is generic  Labile blood pressure: wide range.  -she will follow these at home and alert [place] if they are consistently running higher or lower  Type II diabetes: -A1c 6.7 -on metformin,  Jardiance  Cardiac risk counseling and prevention recommendations: -recommend heart healthy/Mediterranean diet, with whole grains, fruits, vegetable, fish, lean meats, nuts, and olive oil. Limit salt. -recommend moderate walking, 3-5 times/week for 30-50 minutes each session. Aim for at least 150 minutes.week. Goal should be pace of 3 miles/hours, or walking 1.5 miles in 30 minutes -recommend avoidance of tobacco products. Avoid excess alcohol. -Additional risk factor control:  -Weight: BMI ~27  Plan for follow up: 6-8 weeks  [name], MD, PhD [place]  [facility]    Medication Adjustments/Labs and Tests Ordered: Current medicines are reviewed at length with the patient today.  Concerns regarding medicines are outlined above.  Orders Placed This Encounter  Procedures  . EKG 12-Lead   No orders of the defined types were placed in this encounter.  Patient Instructions  Medication Instructions:  Your Physician recommend you continue on your current medication as directed.    *If you need a refill on your cardiac medications before your next appointment, please call your pharmacy*   Lab Work: None  Testing/Procedures: None   Follow-Up: At [facility], you and your health needs are our priority.  As part of our continuing mission to provide you with exceptional heart care, we have created designated Provider Care Teams.  These Care Teams include your primary Cardiologist (physician) and Advanced Practice Providers (APPs -  Physician Assistants and Nurse Practitioners) who all work together to provide you with the care you need, when you need it.  We recommend signing up for the patient portal called "MyChart".  Sign up information is provided on this After Visit Summary.  MyChart is used to connect with patients for Virtual Visits (Telemedicine).  Patients are able to view lab/test results, encounter notes, upcoming appointments, etc.  Non-urgent messages  can be sent to your provider as well.   To learn more about what you can do with MyChart, go to [email or web address].    Your next appointment:   6-8 week(s)  The format for your next appointment:   In Person  Provider:   [name], MD       Signed, [name], MD PhD 05/01/2020  [place]

## 2020-06-30 ENCOUNTER — Ambulatory Visit: Payer: Medicare HMO | Admitting: Cardiology

## 2020-07-05 ENCOUNTER — Telehealth: Payer: Self-pay | Admitting: Cardiology

## 2020-07-05 MED ORDER — LOSARTAN POTASSIUM 25 MG PO TABS
12.5000 mg | ORAL_TABLET | Freq: Every day | ORAL | 3 refills | Status: DC
Start: 2020-07-05 — End: 2020-08-23

## 2020-07-05 NOTE — Telephone Encounter (Signed)
Pt c/o BP issue: STAT if pt c/o blurred vision, one-sided weakness or slurred speech  1. What are your last 5 BP readings? 80something/60something,   100/79  2. Are you having any other symptoms (ex. Dizziness, headache, blurred vision, passed out)?real weak   3. What is your BP issue? Blood pressure running low and pt wanted to be seen- I made an appt for 07-12-20- please call to evaluate

## 2020-07-05 NOTE — Telephone Encounter (Signed)
Spoke with daughter who reports patient had some low blood pressures last week. Called patient who provided RN with a list of blood pressures: 1/26 - 120/79, 81 1/25 - 102/74, 89 1/24 - 80/55, 80 - noon 1/23 - 88/66, 91 - noon 1/22 - 109/79, 89 1/21 - 130/84, 80 1/20 - 123/76, 80 Week prior no dates: 97/74, 80 90/70, 81 101/70, 80  Patient reports when her blood pressure is low she felt dizzy, lightheaded, blurred vision, fuzzy headed and when she sat down she fell asleep. Patient does not check her blood pressure before taking her medications. She checks her BP around 3-4pm daily. She takes Losartan at night, metoprolol, amiodarone in the morning.  Spoke with Phillips Hay, Pharm D. Advised to decrease Losartan to 1/2 tablet at night (12.5mg ), check blood pressure in the AM before she takes her medications. If BP >110, take metoprolol. If BP 100-110 take 1/2 metoprolol. If BP less than 100 do not take metoprolol. Patient may check blood pressure in the afternoon if feeling symptomatic but no more than 2X a day.   Called patient and relayed instructions with repeat back. Called daughter and relayed instructions with repeat back. Advised both that patient is to keep a log of BP and HR with times taken until appointment on 07/12/2020 and to bring blood pressure cuff to appointment. Understanding verbalized.    Medication list updated with new Losartan dose.

## 2020-07-12 ENCOUNTER — Ambulatory Visit: Payer: Medicare HMO | Admitting: Cardiology

## 2020-07-12 ENCOUNTER — Other Ambulatory Visit: Payer: Self-pay

## 2020-07-12 ENCOUNTER — Encounter: Payer: Self-pay | Admitting: Cardiology

## 2020-07-12 VITALS — BP 148/92 | HR 83 | Ht 63.0 in | Wt 142.4 lb

## 2020-07-12 DIAGNOSIS — I428 Other cardiomyopathies: Secondary | ICD-10-CM

## 2020-07-12 DIAGNOSIS — E119 Type 2 diabetes mellitus without complications: Secondary | ICD-10-CM | POA: Diagnosis not present

## 2020-07-12 DIAGNOSIS — I4819 Other persistent atrial fibrillation: Secondary | ICD-10-CM

## 2020-07-12 DIAGNOSIS — I2510 Atherosclerotic heart disease of native coronary artery without angina pectoris: Secondary | ICD-10-CM | POA: Diagnosis not present

## 2020-07-12 DIAGNOSIS — E785 Hyperlipidemia, unspecified: Secondary | ICD-10-CM

## 2020-07-12 NOTE — Progress Notes (Signed)
Cardiology Office Note:    Date:  07/12/2020   ID:  Mora, Cheryl [birth year], MRN [id number]  PCP:  [name], MD  Cardiologist:  [name], MD  Referring MD: [name], MD   CC: follow up  History of Present Illness:    Cheryl Mora is a 87 y.o. female with a hx of type II diabetes, hyperlipidemia, hypertension, hypothyroidism, paroxysmal atrial fibrillation who is seen for follow up today.   Cardiac history: I met her 03/02/20 as a new consult for DOE. Very concerning story, set up for outpatient cath. Cath 9/27 with NICM, reduced EF, and new afib RVR. Her medications were adjusted, and she spontaneously converted to sinus rhythm prior to discharge 03/07/20. Since then, she has returned to atrial fibrillation despite amiodarone and reattempt at cardioversion.   Today: Currently taking medications as follows: Losartan 1/2 pill at night If SBP <100, no metoprolol If SBP 100-110, 1/2 pill metoprolol If SBP >110, take full metoprol  Since last phone note from 07/05/20, BP have been as follows: 2/2 132/90, 86 2/1 150/93, 86  111/80, 81 1/31 142/97, 87 112/87, 95 1/30 151/87  152/102 1/29 128/87  115/85, 80  1/28 143/97, 91 1/27 125/80  111/83, 102 1/26 120/79  123/85  Prior to that, numbers were as follows: 1/26 - 120/79, 81 1/25 - 102/74, 89 1/24 - 80/55, 80 - noon 1/23 - 88/66, 91 - noon 1/22 - 109/79, 89 1/21 - 130/84, 80 1/20 - 123/76, 80 Week prior no dates: 97/74, 80 90/70, 81 101/70, 80  Feels like she functions best when BP is 765 or more systolic. When it is lower, she feels dizzy and out of it.   Will not take Jardiance. Felt the worst when she was taking this. Made her BP low.   Previously on amlodipine 5 mg and atenolol 50 mg remotely. We reviewed why she is on the medications she is currently on and how they are recommended with cardiomyopathy.  We discussed atrial fibrillation at length today. She cannot tell when she  is in or out of afib. We discussed that with her reduced EF, we do attempt to be more aggressive with rhythm control. Reviewed amiodarone and prior cardioversion. We discussed referral to EP to see if she is a candidate for ablation. She declines.  Denies chest pain, shortness of breath at rest or with normal exertion. No PND, orthopnea, or unexpected weight gain. No syncope or palpitations.  Past Medical History:  Diagnosis Date  . Abnormal EKG    suggestive of previous anterior wall myocardial infarction  . Diabetes mellitus ([place])   . Hypertension   . Hypothyroidism     Past Surgical History:  Procedure Laterality Date  . ABDOMINAL HYSTERECTOMY    . CARDIOVERSION N/A 04/13/2020   Procedure: CARDIOVERSION;  Surgeon: Elouise Munroe, MD;  Location: [facility] ENDOSCOPY;  Service: Cardiovascular;  Laterality: N/A;  . CATARACT EXTRACTION    . KNEE SURGERY    . LEFT HEART CATH AND CORONARY ANGIOGRAPHY N/A 03/06/2020   Procedure: LEFT HEART CATH AND CORONARY ANGIOGRAPHY;  Surgeon: Martinique, Peter M, MD;  Location: [place] CV LAB;  Service: Cardiovascular;  Laterality: N/A;    Current Medications: Current Outpatient Medications on File Prior to Visit  Medication Sig  . amiodarone (PACERONE) 200 MG tablet Take 1 tablet (200 mg total) by mouth daily.  Marland Kitchen apixaban (ELIQUIS) 5 MG TABS tablet Take 1 tablet (5 mg total) by mouth 2 (two)  times daily.  . calcium carbonate (TUMS - DOSED IN MG ELEMENTAL CALCIUM) 500 MG chewable tablet Chew 1-2 tablets by mouth daily as needed for indigestion or heartburn.  . cetirizine (ZYRTEC) 10 MG tablet Take 10 mg by mouth daily as needed for allergies.  . diphenhydrAMINE (BENADRYL) 25 MG tablet Take 25 mg by mouth every 6 (six) hours as needed for allergies.  . diphenhydramine-acetaminophen (TYLENOL PM) 25-500 MG TABS tablet Take 1 tablet by mouth at bedtime as needed (sleep).  . dorzolamide-timolol (COSOPT) 22.3-6.8 MG/ML ophthalmic solution Place 1 drop into the  left eye 2 (two) times daily.  . fluticasone (FLONASE) 50 MCG/ACT nasal spray Place 1 spray into the nose 2 (two) times daily as needed for allergies.   . furosemide (LASIX) 20 MG tablet Take 1 tablet (20 mg total) by mouth as needed (for shortness of breath). (Patient taking differently: Take 20 mg by mouth daily as needed (for shortness of breath).)  . levothyroxine (SYNTHROID, LEVOTHROID) 88 MCG tablet Take 88 mcg by mouth daily.  Marland Kitchen losartan (COZAAR) 25 MG tablet Take 0.5 tablets (12.5 mg total) by mouth daily.  . Melatonin 10 MG CAPS Take 10 mg by mouth at bedtime as needed (sleep).  . Menthol-Methyl Salicylate (SALONPAS PAIN RELIEF PATCH EX) Apply 1 application topically daily as needed (pain).  . metFORMIN (GLUCOPHAGE-XR) 500 MG 24 hr tablet Take 500 mg by mouth 2 (two) times daily.  . metoprolol succinate (TOPROL-XL) 50 MG 24 hr tablet Take 1 tablet (50 mg total) by mouth daily. Take with or immediately following a meal.  . Polyethyl Glycol-Propyl Glycol (SYSTANE OP) Place 1 drop into both eyes daily as needed (dryness).  . rosuvastatin (CRESTOR) 20 MG tablet Take 1 tablet (20 mg total) by mouth daily.  . nitroGLYCERIN (NITROSTAT) 0.4 MG SL tablet Place 1 tablet (0.4 mg total) under the tongue every 5 (five) minutes as needed for chest pain.   No current facility-administered medications on file prior to visit.     Allergies:   Amoxicillin-pot clavulanate, Brimonidine, Lisinopril, Brimonidine tartrate-timolol, and Zolpidem   Social History   Tobacco Use  . Smoking status: Former Research scientist (life sciences)  . Smokeless tobacco: Never Used  Vaping Use  . Vaping Use: Never used  Substance Use Topics  . Alcohol use: Yes    Comment: several a day  . Drug use: Never    Family History: family history includes Cirrhosis in her mother; Heart attack in an other family member; Hypertension in her mother; Stroke in her mother.  ROS:   Please see the history of present illness.  Additional pertinent ROS  otherwise unremarkable.   EKGs/Labs/Other Studies Reviewed:    The following studies were reviewed today: [facility] 2020/03/14  Prox RCA lesion is 30% stenosed.  There is severe left ventricular systolic dysfunction.  LV end diastolic pressure is normal.  The left ventricular ejection fraction is 25-35% by visual estimate.   1. Mild nonobstructive CAD 2. Severe LV dysfunction with pattern consistent with Takotsubo's cardiomyopathy. EF 30% 3. Normal LVEDP 4. New onset Atrial fibrillation compared to yesterday.  Plan: admit to telemetry. Will initiate IV heparin. May consider transition to NOAC tomorrow. Hold amlodipine due to hypotension. Switch atenolol to metoprolol. Check Echo.   Echo Mar 14, 2020 1. Left ventricular ejection fraction, by estimation, is 25 to 30%. The  left ventricle has severely decreased function. The left ventricle  demonstrates global hypokinesis. Left ventricular diastolic function could  not be evaluated.  2. Right ventricular systolic function  is normal. The right ventricular  size is normal. There is normal pulmonary artery systolic pressure. The  estimated right ventricular systolic pressure is 16.1 mmHg.  3. Left atrial size was moderately dilated.  4. Right atrial size was mildly dilated.  5. There is no evidence of cardiac tamponade.  6. The mitral valve is normal in structure. Mild mitral valve  regurgitation. No evidence of mitral stenosis. Moderate mitral annular  calcification.  7. The aortic valve is normal in structure. Aortic valve regurgitation is  trivial. Mild aortic valve sclerosis is present, with no evidence of  aortic valve stenosis.  8. The inferior vena cava is normal in size with greater than 50%  respiratory variability, suggesting right atrial pressure of 3 mmHg.  EKG:  EKG is personally reviewed.  The ekg ordered today demonstrates atrial fibrillation/atypical atrial flutter at 83 bpm  Recent Labs: 03/06/2020: B Natriuretic  Peptide 1,385.7; TSH 1.327 04/06/2020: BUN 17; Creatinine, Ser 1.16; Hemoglobin 12.5; Platelets 224; Potassium 4.6; Sodium 143  Recent Lipid Panel    Component Value Date/Time   CHOL 121 03/07/2020 1150   TRIG 111 03/07/2020 1150   HDL 48 03/07/2020 1150   CHOLHDL 2.5 03/07/2020 1150   VLDL 22 03/07/2020 1150   LDLCALC 51 03/07/2020 1150    Physical Exam:    VS:  BP (!) 148/92   Pulse 83   Ht _0  (1.6 m)   Wt 142 lb 6.4 oz (64.6 kg)   SpO2 99%   BMI 25.23 kg/m     Wt Readings from Last 3 Encounters:  07/12/20 142 lb 6.4 oz (64.6 kg)  05/01/20 147 lb (66.7 kg)  04/06/20 151 lb (68.5 kg)    GEN: Well nourished, well developed in no acute distress HEENT: Normal, moist mucous membranes NECK: No JVD CARDIAC: irregularly irregular rhythm, normal S1 and S2, no rubs or gallops. No murmur. VASCULAR: Radial and DP pulses 2+ bilaterally. No carotid bruits RESPIRATORY:  Clear to auscultation without rales, wheezing or rhonchi  ABDOMEN: Soft, non-tender, non-distended MUSCULOSKELETAL:  Ambulates independently SKIN: Warm and dry, no edema NEUROLOGIC:  Alert and oriented x 3. No focal neuro deficits noted. PSYCHIATRIC:  Normal affect   ASSESSMENT:    1. Persistent atrial fibrillation ([place])   2. NICM (nonischemic cardiomyopathy) ([place])   3. Type 2 diabetes mellitus without complication, without long-term current use of insulin ([place])   4. Nonocclusive coronary atherosclerosis of native coronary artery   5. Hyperlipidemia, unspecified hyperlipidemia type    PLAN:    Atrial fibrillation, now persistent from paroxysmal Nonischemic cardiomyopathy -cath showed reduced EF, nonischemic cardiomyopathy -echo confirmed reduced EF -tolerating low dose losartan, metoprolol succinate. Had to decrease losartan dose due to hypotension -discussed amiodarone. She is tolerating well, but she did not stay in sinus rhythm after most recent cardioversion -declines referral to EP to discuss  ablation -we will discuss if she wants to pursue rhythm control strategy again. If not, would consider stopping amiodarone. -CHA2DS2/VAS Stroke Risk Points=  6  -has not missed any doses of apixaban -tried jardiance, felt terrible. Refuses to reattempt this or similar medications  Hyperlipidemia: given nonocclusive CAD, needs statin, goal LDL <70 -lipids reviewed, LDL 51 -continue rosuvastatin  Type II diabetes: -A1c 6.7 -on metformin -did not tolerate Jardiance, made her feel terrible, refuses to trial similar  Cardiac risk counseling and prevention recommendations: -recommend heart healthy/Mediterranean diet, with whole grains, fruits, vegetable, fish, lean meats, nuts, and olive oil. Limit salt. -recommend moderate walking, 3-5  times/week for 30-50 minutes each session. Aim for at least 150 minutes.week. Goal should be pace of 3 miles/hours, or walking 1.5 miles in 30 minutes -recommend avoidance of tobacco products. Avoid excess alcohol.  Plan for follow up: 2 mos or sooner as needed  Total time of encounter: 50 minutes total time of encounter, including 47 minutes spent in face-to-face patient care. This time includes coordination of care and counseling regarding atrial fibrillation, cardiomyopathy. Remainder of non-face-to-face time involved reviewing chart documents/testing relevant to the patient encounter and documentation in the medical record.  [name], MD, PhD, [facility]   Medication Adjustments/Labs and Tests Ordered: Current medicines are reviewed at length with the patient today.  Concerns regarding medicines are outlined above.  Orders Placed This Encounter  Procedures  . EKG 12-Lead   No orders of the defined types were placed in this encounter.   Patient Instructions  Medication Instructions:  Your Physician recommend you continue on your current medication as directed.    *If you need a refill on your cardiac  medications before your next appointment, please call your pharmacy*   Lab Work: None   Testing/Procedures: None   Follow-Up: At [facility], you and your health needs are our priority.  As part of our continuing mission to provide you with exceptional heart care, we have created designated Provider Care Teams.  These Care Teams include your primary Cardiologist (physician) and Advanced Practice Providers (APPs -  Physician Assistants and Nurse Practitioners) who all work together to provide you with the care you need, when you need it.  We recommend signing up for the patient portal called "MyChart".  Sign up information is provided on this After Visit Summary.  MyChart is used to connect with patients for Virtual Visits (Telemedicine).  Patients are able to view lab/test results, encounter notes, upcoming appointments, etc.  Non-urgent messages can be sent to your provider as well.   To learn more about what you can do with MyChart, go to [email or web address].    Your next appointment:   2 month(s)  The format for your next appointment:   In Person  Provider:   [name], MD      Signed, [name], MD PhD 07/12/2020  [place]

## 2020-07-12 NOTE — Patient Instructions (Signed)
Medication Instructions:  °Your Physician recommend you continue on your current medication as directed.   ° °*If you need a refill on your cardiac medications before your next appointment, please call your pharmacy* ° ° °Lab Work: °None ° ° °Testing/Procedures: °None ° ° °Follow-Up: °At [facility], you and your health needs are our priority.  As part of our continuing mission to provide you with exceptional heart care, we have created designated Provider Care Teams.  These Care Teams include your primary Cardiologist (physician) and Advanced Practice Providers (APPs -  Physician Assistants and Nurse Practitioners) who all work together to provide you with the care you need, when you need it. ° °We recommend signing up for the patient portal called "MyChart".  Sign up information is provided on this After Visit Summary.  MyChart is used to connect with patients for Virtual Visits (Telemedicine).  Patients are able to view lab/test results, encounter notes, upcoming appointments, etc.  Non-urgent messages can be sent to your provider as well.   °To learn more about what you can do with MyChart, go to [email or web address].   ° °Your next appointment:   °2 month(s) ° °The format for your next appointment:   °In Person ° °Provider:   °Bridgette Christopher, MD ° ° ° ° °

## 2020-07-13 ENCOUNTER — Encounter: Payer: Self-pay | Admitting: Cardiology

## 2020-08-04 ENCOUNTER — Ambulatory Visit: Payer: Medicare HMO | Admitting: Cardiology

## 2020-08-20 ENCOUNTER — Encounter: Payer: Self-pay | Admitting: Cardiology

## 2020-08-23 ENCOUNTER — Ambulatory Visit: Payer: Medicare HMO | Admitting: Cardiology

## 2020-08-23 ENCOUNTER — Other Ambulatory Visit: Payer: Self-pay

## 2020-08-23 ENCOUNTER — Encounter: Payer: Self-pay | Admitting: Cardiology

## 2020-08-23 VITALS — BP 158/110 | HR 90 | Ht 63.0 in | Wt 141.8 lb

## 2020-08-23 DIAGNOSIS — I428 Other cardiomyopathies: Secondary | ICD-10-CM | POA: Diagnosis not present

## 2020-08-23 DIAGNOSIS — E119 Type 2 diabetes mellitus without complications: Secondary | ICD-10-CM

## 2020-08-23 DIAGNOSIS — I4819 Other persistent atrial fibrillation: Secondary | ICD-10-CM | POA: Diagnosis not present

## 2020-08-23 DIAGNOSIS — I2510 Atherosclerotic heart disease of native coronary artery without angina pectoris: Secondary | ICD-10-CM | POA: Diagnosis not present

## 2020-08-23 DIAGNOSIS — R42 Dizziness and giddiness: Secondary | ICD-10-CM

## 2020-08-23 DIAGNOSIS — E785 Hyperlipidemia, unspecified: Secondary | ICD-10-CM

## 2020-08-23 NOTE — Progress Notes (Signed)
Cardiology Office Note:    Date:  08/23/2020   ID:  [name], DOB Sep 24, 1932, MRN [id number]  PCP:  [name], MD  Cardiologist:  [name], MD  Referring MD: [name], MD   CC: follow up  History of Present Illness:    Cheryl Mora is a 87 y.o. female with a hx of type II diabetes, hyperlipidemia, hypertension, hypothyroidism, paroxysmal atrial fibrillation who is seen for follow up today.   Cardiac history: I met her 03/02/20 as a new consult for DOE. Very concerning story, set up for outpatient cath. Cath 9/27 with NICM, reduced EF, and new afib RVR. Her medications were adjusted, and she spontaneously converted to sinus rhythm prior to discharge 03/07/20. Since then, she has returned to atrial fibrillation despite amiodarone and reattempt at cardioversion.  Today: Here with her daughter today. Had a bad fall 08/10/20, seen by [facility]. Had felt dizzy/lightheaded, but no shortness of breath or leg swelling. Bp from that day was up to 130/92 earlier in the day to 95/69 later in the day.  NT-proBNP elevated at 3587. Has felt terrible in general, but no change in breathing or edema at the time. Last echo 03/06/20 showed EF 25-30%, nonischemic based on cath.   Yesterday started to feel better, no clear reason why.   Hasn't taken losartan or metoprolol in last 4-5 days. BP elevated in the office but reports that it has been low at home.   We had a long discussion today about what BNP measures, how useful it is in the absence of symptoms, and goals for BP management and medical management in general. See summary below.  Denies chest pain, shortness of breath at rest or with normal exertion. No PND, orthopnea, LE edema or unexpected weight gain. No syncope or palpitations.  Past Medical History:  Diagnosis Date  . Abnormal EKG    suggestive of previous anterior wall myocardial infarction  . Diabetes mellitus ([place])   . Hypertension   . Hypothyroidism      Past Surgical History:  Procedure Laterality Date  . ABDOMINAL HYSTERECTOMY    . CARDIOVERSION N/A 04/13/2020   Procedure: CARDIOVERSION;  Surgeon: Elouise Munroe, MD;  Location: [facility] ENDOSCOPY;  Service: Cardiovascular;  Laterality: N/A;  . CATARACT EXTRACTION    . KNEE SURGERY    . LEFT HEART CATH AND CORONARY ANGIOGRAPHY N/A 03/06/2020   Procedure: LEFT HEART CATH AND CORONARY ANGIOGRAPHY;  Surgeon: Martinique, Peter M, MD;  Location: [place] CV LAB;  Service: Cardiovascular;  Laterality: N/A;    Current Medications: Current Outpatient Medications on File Prior to Visit  Medication Sig  . amiodarone (PACERONE) 200 MG tablet Take 1 tablet (200 mg total) by mouth daily.  Marland Kitchen apixaban (ELIQUIS) 5 MG TABS tablet Take 1 tablet (5 mg total) by mouth 2 (two) times daily.  . calcium carbonate (TUMS - DOSED IN MG ELEMENTAL CALCIUM) 500 MG chewable tablet Chew 1-2 tablets by mouth daily as needed for indigestion or heartburn.  . cetirizine (ZYRTEC) 10 MG tablet Take 10 mg by mouth daily as needed for allergies.  . diphenhydrAMINE (BENADRYL) 25 MG tablet Take 25 mg by mouth every 6 (six) hours as needed for allergies.  . diphenhydramine-acetaminophen (TYLENOL PM) 25-500 MG TABS tablet Take 1 tablet by mouth at bedtime as needed (sleep).  . dorzolamide-timolol (COSOPT) 22.3-6.8 MG/ML ophthalmic solution Place 1 drop into the left eye 2 (two) times daily.  . fluticasone (FLONASE) 50 MCG/ACT nasal spray  Place 1 spray into the nose 2 (two) times daily as needed for allergies.   . furosemide (LASIX) 20 MG tablet Take 1 tablet (20 mg total) by mouth as needed (for shortness of breath). (Patient taking differently: Take 20 mg by mouth daily as needed (for shortness of breath).)  . levothyroxine (SYNTHROID, LEVOTHROID) 88 MCG tablet Take 88 mcg by mouth daily.  . Melatonin 10 MG CAPS Take 10 mg by mouth at bedtime as needed (sleep).  . Menthol-Methyl Salicylate (SALONPAS PAIN RELIEF PATCH EX) Apply 1  application topically daily as needed (pain).  . metFORMIN (GLUCOPHAGE-XR) 500 MG 24 hr tablet Take 500 mg by mouth 2 (two) times daily.  Vladimir Faster Glycol-Propyl Glycol (SYSTANE OP) Place 1 drop into both eyes daily as needed (dryness).  . rosuvastatin (CRESTOR) 20 MG tablet Take 1 tablet (20 mg total) by mouth daily.  Marland Kitchen losartan (COZAAR) 25 MG tablet Take 0.5 tablets (12.5 mg total) by mouth daily. (Patient not taking: Reported on 08/23/2020)  . metoprolol succinate (TOPROL-XL) 50 MG 24 hr tablet Take 1 tablet (50 mg total) by mouth daily. Take with or immediately following a meal. (Patient not taking: Reported on 08/23/2020)  . nitroGLYCERIN (NITROSTAT) 0.4 MG SL tablet Place 1 tablet (0.4 mg total) under the tongue every 5 (five) minutes as needed for chest pain.   No current facility-administered medications on file prior to visit.     Allergies:   Amoxicillin-pot clavulanate, Brimonidine, Lisinopril, Brimonidine tartrate-timolol, and Zolpidem   Social History   Tobacco Use  . Smoking status: Former Research scientist (life sciences)  . Smokeless tobacco: Never Used  Vaping Use  . Vaping Use: Never used  Substance Use Topics  . Alcohol use: Yes    Comment: several a day  . Drug use: Never    Family History: family history includes Cirrhosis in her mother; Heart attack in an other family member; Hypertension in her mother; Stroke in her mother.  ROS:   Please see the history of present illness.  Additional pertinent ROS otherwise unremarkable.   EKGs/Labs/Other Studies Reviewed:    The following studies were reviewed today: LHC 03/09/2020  Prox RCA lesion is 30% stenosed.  There is severe left ventricular systolic dysfunction.  LV end diastolic pressure is normal.  The left ventricular ejection fraction is 25-35% by visual estimate.   1. Mild nonobstructive CAD 2. Severe LV dysfunction with pattern consistent with Takotsubo's cardiomyopathy. EF 30% 3. Normal LVEDP 4. New onset Atrial  fibrillation compared to yesterday.  Plan: admit to telemetry. Will initiate IV heparin. May consider transition to NOAC tomorrow. Hold amlodipine due to hypotension. Switch atenolol to metoprolol. Check Echo.   Echo Mar 09, 2020 1. Left ventricular ejection fraction, by estimation, is 25 to 30%. The  left ventricle has severely decreased function. The left ventricle  demonstrates global hypokinesis. Left ventricular diastolic function could  not be evaluated.  2. Right ventricular systolic function is normal. The right ventricular  size is normal. There is normal pulmonary artery systolic pressure. The  estimated right ventricular systolic pressure is 64.3 mmHg.  3. Left atrial size was moderately dilated.  4. Right atrial size was mildly dilated.  5. There is no evidence of cardiac tamponade.  6. The mitral valve is normal in structure. Mild mitral valve  regurgitation. No evidence of mitral stenosis. Moderate mitral annular  calcification.  7. The aortic valve is normal in structure. Aortic valve regurgitation is  trivial. Mild aortic valve sclerosis is present, with no evidence of  aortic valve stenosis.  8. The inferior vena cava is normal in size with greater than 50%  respiratory variability, suggesting right atrial pressure of 3 mmHg.  EKG:  EKG is personally reviewed.  The ekg ordered today demonstrates atrial fibrillation at 90 bpm  Recent Labs: 03/06/2020: B Natriuretic Peptide 1,385.7; TSH 1.327 04/06/2020: BUN 17; Creatinine, Ser 1.16; Hemoglobin 12.5; Platelets 224; Potassium 4.6; Sodium 143  Recent Lipid Panel    Component Value Date/Time   CHOL 121 03/07/2020 1150   TRIG 111 03/07/2020 1150   HDL 48 03/07/2020 1150   CHOLHDL 2.5 03/07/2020 1150   VLDL 22 03/07/2020 1150   LDLCALC 51 03/07/2020 1150    Physical Exam:    VS:  BP (!) 158/110   Pulse 90   Ht 5' 3" (1.6 m)   Wt 141 lb 12.8 oz (64.3 kg)   SpO2 98%   BMI 25.12 kg/m     Wt Readings from  Last 3 Encounters:  08/23/20 141 lb 12.8 oz (64.3 kg)  07/12/20 142 lb 6.4 oz (64.6 kg)  05/01/20 147 lb (66.7 kg)    GEN: Well nourished, well developed in no acute distress HEENT: Normal, moist mucous membranes NECK: No JVD CARDIAC: irregularly irregular rhythm, normal S1 and S2, no rubs or gallops. No murmur. VASCULAR: Radial and DP pulses 2+ bilaterally. No carotid bruits RESPIRATORY:  Clear to auscultation without rales, wheezing or rhonchi  ABDOMEN: Soft, non-tender, non-distended MUSCULOSKELETAL:  Ambulates independently SKIN: Warm and dry, no edema NEUROLOGIC:  Alert and oriented x 3. No focal neuro deficits noted. PSYCHIATRIC:  Normal affect   ASSESSMENT:    1. Persistent atrial fibrillation ([place])   2. NICM (nonischemic cardiomyopathy) ([place])   3. Type 2 diabetes mellitus without complication, without long-term current use of insulin ([place])   4. Nonocclusive coronary atherosclerosis of native coronary artery   5. Hyperlipidemia, unspecified hyperlipidemia type   6. Episodic lightheadedness    PLAN:    Atrial fibrillation, now persistent from paroxysmal Nonischemic cardiomyopathy Labile hypertension/hypotension  Workup has shown NICM, reduced EF. We discussed the recommended medical management of this with ARB and beta blocker. However, we have had a great deal of difficulty finding the right balance with this. Even with daily titration based on her BP numbers and HR, we can't find a place where she feels well.  We discussed that there is no specific therapy for her other than medical management. However, she is euvolemic today (and sounds like she was when BNP was drawn, so not helpful). With her recent fall, we discussed the balance between optimal medical management and focusing on symptoms.  She started to feel better several days after she stopped losartan and metoprolol. Therefore, we will try a period off of those medications. If she feels well, isn't falling, having  less lightheadedness, etc we may continue this long term. If her BP does rise, would prioritize restarting metoprolol. This would allow [place] to rate control afib (and hopefully stop amiodarone). Will continue amiodarone in the short term to avoid RVR.  -CHA2DS2/VAS Stroke Risk Points=  6. She had asked about coming off of this. We reviewed her personal risk of stroke vs. Risk of bleeding together today. After shared decision making, she will continue apixaban. -tried jardiance, felt terrible. Refuses to reattempt this or similar medications  Hyperlipidemia: given nonocclusive CAD, needs statin, goal LDL <70 -lipids reviewed, LDL 51 -continue rosuvastatin  Type II diabetes: -A1c 6.7 -on metformin -did not tolerate Jardiance, made her  feel terrible, refuses to trial similar  Cardiac risk counseling and prevention recommendations: -recommend heart healthy/Mediterranean diet, with whole grains, fruits, vegetable, fish, lean meats, nuts, and olive oil. Limit salt. -recommend moderate walking, 3-5 times/week for 30-50 minutes each session. Aim for at least 150 minutes.week. Goal should be pace of 3 miles/hours, or walking 1.5 miles in 30 minutes -recommend avoidance of tobacco products. Avoid excess alcohol.  Plan for follow up: 1 month  [name], MD, PhD, [facility]   Medication Adjustments/Labs and Tests Ordered: Current medicines are reviewed at length with the patient today.  Concerns regarding medicines are outlined above.  Orders Placed This Encounter  Procedures  . EKG 12-Lead  . ECHOCARDIOGRAM COMPLETE   No orders of the defined types were placed in this encounter.   Patient Instructions  Medication Instructions:  Stop: Metoprolol 50 mg daily Stop: Losartan 25 mg daily  *If you need a refill on your cardiac medications before your next appointment, please call your pharmacy*   Lab Work: None   Testing/Procedures: Your physician has  requested that you have an echocardiogram. Echocardiography is a painless test that uses sound waves to create images of your heart. It provides your doctor with information about the size and shape of your heart and how well your heart's chambers and valves are working. This procedure takes approximately one hour. There are no restrictions for this procedure. [address] [address]   Follow-Up: At [facility], you and your health needs are our priority.  As part of our continuing mission to provide you with exceptional heart care, we have created designated Provider Care Teams.  These Care Teams include your primary Cardiologist (physician) and Advanced Practice Providers (APPs -  Physician Assistants and Nurse Practitioners) who all work together to provide you with the care you need, when you need it.  We recommend signing up for the patient portal called "MyChart".  Sign up information is provided on this After Visit Summary.  MyChart is used to connect with patients for Virtual Visits (Telemedicine).  Patients are able to view lab/test results, encounter notes, upcoming appointments, etc.  Non-urgent messages can be sent to your provider as well.   To learn more about what you can do with MyChart, go to [email or web address].    Your next appointment:   1 month(s)  The format for your next appointment:   In Person  Provider:   [name], MD      Signed, [name], MD PhD 08/23/2020  [place]

## 2020-08-23 NOTE — Patient Instructions (Addendum)
Medication Instructions:  Stop: Metoprolol 50 mg daily Stop: Losartan 25 mg daily  *If you need a refill on your cardiac medications before your next appointment, please call your pharmacy*   Lab Work: None   Testing/Procedures: Your physician has requested that you have an echocardiogram. Echocardiography is a painless test that uses sound waves to create images of your heart. It provides your doctor with information about the size and shape of your heart and how well your heart's chambers and valves are working. This procedure takes approximately one hour. There are no restrictions for this procedure. [address]   Follow-Up: At [facility], you and your health needs are our priority.  As part of our continuing mission to provide you with exceptional heart care, we have created designated Provider Care Teams.  These Care Teams include your primary Cardiologist (physician) and Advanced Practice Providers (APPs -  Physician Assistants and Nurse Practitioners) who all work together to provide you with the care you need, when you need it.  We recommend signing up for the patient portal called "MyChart".  Sign up information is provided on this After Visit Summary.  MyChart is used to connect with patients for Virtual Visits (Telemedicine).  Patients are able to view lab/test results, encounter notes, upcoming appointments, etc.  Non-urgent messages can be sent to your provider as well.   To learn more about what you can do with MyChart, go to [email or web address].    Your next appointment:   1 month(s)  The format for your next appointment:   In Person  Provider:   Jodelle Red, MD

## 2020-08-24 ENCOUNTER — Encounter: Payer: Self-pay | Admitting: Cardiology

## 2020-09-08 ENCOUNTER — Ambulatory Visit: Payer: Medicare HMO | Admitting: Cardiology

## 2020-09-20 ENCOUNTER — Ambulatory Visit (HOSPITAL_COMMUNITY): Payer: Medicare HMO | Attending: Cardiology

## 2020-09-20 ENCOUNTER — Other Ambulatory Visit: Payer: Self-pay

## 2020-09-20 DIAGNOSIS — I4819 Other persistent atrial fibrillation: Secondary | ICD-10-CM | POA: Diagnosis present

## 2020-09-20 DIAGNOSIS — I428 Other cardiomyopathies: Secondary | ICD-10-CM | POA: Diagnosis not present

## 2020-09-20 LAB — ECHOCARDIOGRAM COMPLETE
Area-P 1/2: 6.37 cm2
S' Lateral: 2.1 cm

## 2020-10-12 ENCOUNTER — Ambulatory Visit: Payer: Medicare HMO | Admitting: Cardiology

## 2020-10-18 NOTE — Progress Notes (Signed)
Cardiology Clinic Note   Patient Name: Cheryl Mora Date of Encounter: 10/20/2020  Primary Care Provider:  Eartha Inch, MD Primary Cardiologist:  Jodelle Red, MD  Patient Profile    Cheryl Mora 87 year old female presents the clinic today for follow-up evaluation of her nonischemic cardiomyopathy and atrial fibrillation with RVR.  Past Medical History    Past Medical History:  Diagnosis Date  . Abnormal EKG    suggestive of previous anterior wall myocardial infarction  . Diabetes mellitus (HCC)   . Hypertension   . Hypothyroidism    Past Surgical History:  Procedure Laterality Date  . ABDOMINAL HYSTERECTOMY    . CARDIOVERSION N/A 04/13/2020   Procedure: CARDIOVERSION;  Surgeon: [name], MD;  Location: [facility] ENDOSCOPY;  Service: Cardiovascular;  Laterality: N/A;  . CATARACT EXTRACTION    . KNEE SURGERY    . LEFT HEART CATH AND CORONARY ANGIOGRAPHY N/A 03/06/2020   Procedure: LEFT HEART CATH AND CORONARY ANGIOGRAPHY;  Surgeon: Swaziland, Peter M, MD;  Location[facility] INVASIVE CV LAB;  Service: Cardiovascular;  Laterality: N/A;    Allergies  Allergies  Allergen Reactions  . Amoxicillin-Pot Clavulanate Diarrhea and Other (See Comments)    Drowsy    . Brimonidine Other (See Comments)    Conj follicles and injection   . Lisinopril Swelling and Other (See Comments)    Angioedema welling lips, throat closing    . Brimonidine Tartrate-Timolol Other (See Comments)    Redness in eye  . Zolpidem Swelling    Lips swell    History of Present Illness    Cheryl Mora has a PMH of essential hypertension, and ICM, persistent atrial fibrillation, acute systolic CHF, type 2 diabetes, hyperlipidemia, and dyspnea on exertion.  She was initially seen by Dr. Cristal Deer 03/02/2020 as a new consult for DOE.  She was set up for outpatient cardiac catheterization.  Her cardiac catheterization 9/27 showed an ICM, reduced EF, and new atrial fibrillation with RVR.   Her medications were adjusted and she spontaneously converted to sinus rhythm prior to her discharge on 9/21.  She has had episodes of atrial fibrillation despite amiodarone therapy and reattempt with DCCV.  She was seen by Dr. Cristal Deer on 08/23/2020.  She presented with her daughter.  She reported she had fallen 08/10/2020.  She was seen by [facility].  She reported she had felt dizzy, lightheaded, but had no shortness of breath or lower extremity edema.  She reported her blood pressure that day was 130/92 with a blood pressure later in the day of 95/69.  Her proBNP was elevated at 3587.  She reported she felt unwell but had no change in her breathing or increased swelling at that time.  She reported that the day prior to her visit she started to feel better with no clear reason.  She had not taken her losartan or metoprolol in 4-5 days.  Her blood pressure was elevated during her visit however, she reported it was  well controlled at home.  Her metoprolol and losartan were discontinued at that time to trial how she felt off the medication.  CHA2DS2-VASc score of 6.  She also asked about coming off of her apixaban.  Her risk of CVA was reviewed.  She denied chest pain, shortness of breath, orthopnea, PND, lower extremity swelling, unexplained weight gain, palpitations and syncope.  She presents the clinic today for follow-up evaluation states she feels well.  She denies increased shortness of breath and has been remaining fairly  physically active.  Her main complaint is occasional bruising that she has on her hands and legs.  We reviewed her risk of CVA.  She expressed understanding.  She reports she has not had any lower extremity swelling or changes in her breathing.  She has not needed to take any as needed furosemide.  We reviewed her recent 10/17/2020 lab results.  Lab results reviewed with Dr. Cristal Deer.  We do not feel that her renal function changes are related to amiodarone dosing.  I will have  her continue to monitor home blood pressure, increase her physical activity as tolerated, and have her follow-up with Dr. Cristal Deer in 6 months.  Today she denies chest pain, shortness of breath, lower extremity edema, fatigue, palpitations, melena, hematuria, hemoptysis, diaphoresis, weakness, presyncope, syncope, orthopnea, and PND.  Home Medications    Prior to Admission medications   Medication Sig Start Date End Date Taking? Authorizing Provider  amiodarone (PACERONE) 200 MG tablet Take 1 tablet (200 mg total) by mouth daily. 04/06/20 04/01/21  Jodelle Red, MD  apixaban (ELIQUIS) 5 MG TABS tablet Take 1 tablet (5 mg total) by mouth 2 (two) times daily. 03/28/20   Jodelle Red, MD  calcium carbonate (TUMS - DOSED IN MG ELEMENTAL CALCIUM) 500 MG chewable tablet Chew 1-2 tablets by mouth daily as needed for indigestion or heartburn.    [provider]  cetirizine (ZYRTEC) 10 MG tablet Take 10 mg by mouth daily as needed for allergies.    [provider]  diphenhydrAMINE (BENADRYL) 25 MG tablet Take 25 mg by mouth every 6 (six) hours as needed for allergies.    [provider]  diphenhydramine-acetaminophen (TYLENOL PM) 25-500 MG TABS tablet Take 1 tablet by mouth at bedtime as needed (sleep).    [provider]  dorzolamide-timolol (COSOPT) 22.3-6.8 MG/ML ophthalmic solution Place 1 drop into the left eye 2 (two) times daily.    [provider]  fluticasone (FLONASE) 50 MCG/ACT nasal spray Place 1 spray into the nose 2 (two) times daily as needed for allergies.     [provider]  furosemide (LASIX) 20 MG tablet Take 1 tablet (20 mg total) by mouth as needed (for shortness of breath). Patient taking differently: Take 20 mg by mouth daily as needed (for shortness of breath). 03/07/20   Bhagat, Sharrell Ku, PA  levothyroxine (SYNTHROID, LEVOTHROID) 88 MCG tablet Take 88 mcg by mouth daily.    [provider]   Melatonin 10 MG CAPS Take 10 mg by mouth at bedtime as needed (sleep).    [provider]  Menthol-Methyl Salicylate (SALONPAS PAIN RELIEF PATCH EX) Apply 1 application topically daily as needed (pain).    [provider]  metFORMIN (GLUCOPHAGE-XR) 500 MG 24 hr tablet Take 500 mg by mouth 2 (two) times daily.    [provider]  nitroGLYCERIN (NITROSTAT) 0.4 MG SL tablet Place 1 tablet (0.4 mg total) under the tongue every 5 (five) minutes as needed for chest pain. 03/02/20 05/31/20  Jodelle Red, MD  Polyethyl Glycol-Propyl Glycol (SYSTANE OP) Place 1 drop into both eyes daily as needed (dryness).    [provider]  rosuvastatin (CRESTOR) 20 MG tablet Take 1 tablet (20 mg total) by mouth daily. 03/28/20   Jodelle Red, MD    Family History    Family History  Problem Relation Age of Onset  . Hypertension Mother   . Stroke Mother   . Cirrhosis Mother   . Heart attack Other    She  indicated that her mother is deceased. She indicated that her father is deceased. She indicated that the status of her other is unknown.  Social History    Social History   Socioeconomic History  . Marital status: Married    Spouse name: Not on file  . Number of children: 2  . Years of education: Not on file  . Highest education level: Not on file  Occupational History  . Occupation: retired  Tobacco Use  . Smoking status: Former Games developer  . Smokeless tobacco: Never Used  Vaping Use  . Vaping Use: Never used  Substance and Sexual Activity  . Alcohol use: Yes    Comment: several a day  . Drug use: Never  . Sexual activity: Not on file  Other Topics Concern  . Not on file  Social History Narrative  . Not on file   Social Determinants of Health   Financial Resource Strain: Not on file  Food Insecurity: Not on file  Transportation Needs: Not on file  Physical Activity: Not on file  Stress: Not on file  Social Connections: Not on file   Intimate Partner Violence: Not on file     Review of Systems    General:  No chills, fever, night sweats or weight changes.  Cardiovascular:  No chest pain, dyspnea on exertion, edema, orthopnea, palpitations, paroxysmal nocturnal dyspnea. Dermatological: No rash, lesions/masses Respiratory: No cough, dyspnea Urologic: No hematuria, dysuria Abdominal:   No nausea, vomiting, diarrhea, bright red blood per rectum, melena, or hematemesis Neurologic:  No visual changes, wkns, changes in mental status. All other systems reviewed and are otherwise negative except as noted above.  Physical Exam    VS:  BP (!) 150/80 (BP Location: Left Arm)   Pulse (!) 58   Ht 5\' 3"  (1.6 m)   Wt 139 lb 3.2 oz (63.1 kg)   BMI 24.66 kg/m  , BMI Body mass index is 24.66 kg/m. GEN: Well nourished, well developed, in no acute distress. HEENT: normal. Neck: Supple, no JVD, carotid bruits, or masses. Cardiac: RRR, no murmurs, rubs, or gallops. No clubbing, cyanosis, edema.  Radials/DP/PT 2+ and equal bilaterally.  Respiratory:  Respirations regular and unlabored, clear to auscultation bilaterally. GI: Soft, nontender, nondistended, BS + x 4. MS: no deformity or atrophy. Skin: warm and dry, no rash. Neuro:  Strength and sensation are intact. Psych: Normal affect.  Accessory Clinical Findings    Recent Labs: 03/06/2020: B Natriuretic Peptide 1,385.7; TSH 1.327 04/06/2020: BUN 17; Creatinine, Ser 1.16; Hemoglobin 12.5; Platelets 224; Potassium 4.6; Sodium 143   Recent Lipid Panel    Component Value Date/Time   CHOL 121 03/07/2020 1150   TRIG 111 03/07/2020 1150   HDL 48 03/07/2020 1150   CHOLHDL 2.5 03/07/2020 1150   VLDL 22 03/07/2020 1150   LDLCALC 51 03/07/2020 1150    ECG personally reviewed by me today-none today.  Echocardiogram 09/20/2020 IMPRESSIONS    1. Left ventricular ejection fraction, by estimation, is 55 to 60%. The  left ventricle has normal function. The left ventricle has no  regional  wall motion abnormalities. There is mild left ventricular hypertrophy.  Left ventricular diastolic parameters  are indeterminate.  2. Right ventricular systolic function is normal. The right ventricular  size is normal. There is normal pulmonary artery systolic pressure. The  estimated right ventricular systolic pressure is 35.5 mmHg.  3. Left atrial size was moderately dilated.  4. Right atrial size was mildly dilated.  5. The mitral valve is  normal in structure. Moderate mitral valve  regurgitation. No evidence of mitral stenosis.  6. The aortic valve is tricuspid. Aortic valve regurgitation is not  visualized. Mild aortic valve sclerosis is present, with no evidence of  aortic valve stenosis.  7. The inferior vena cava is normal in size with greater than 50%  respiratory variability, suggesting right atrial pressure of 3 mmHg.  8. The patient was in atrial fibrillation.   Cardiac catheterization 03/06/2020  Prox RCA lesion is 30% stenosed.  There is severe left ventricular systolic dysfunction.  LV end diastolic pressure is normal.  The left ventricular ejection fraction is 25-35% by visual estimate.   1. Mild nonobstructive CAD 2. Severe LV dysfunction with pattern consistent with Takotsubo's cardiomyopathy. EF 30% 3. Normal LVEDP 4. New onset Atrial fibrillation compared to yesterday.  Plan: admit to telemetry. Will initiate IV heparin. May consider transition to NOAC tomorrow. Hold amlodipine due to hypotension. Switch atenolol to metoprolol. Check Echo.    Assessment & Plan   1.  Persistent atrial fibrillation-heart rate today 58.  Denies recent episodes of increased heart rate or irregular heartbeats.  Reports compliance with apixaban and denies bleeding issues.  PA from [facility] sent message requesting we review renal labs and expressed concern about amiodarone.  Case reviewed with Dr. Cristal Deer.  We do not feel the amiodarone is causing her creatinine  changes. Continue apixaban, amiodarone Heart healthy low-sodium diet-salty 6 given Increase physical activity as tolerated  Nonischemic cardiomyopathy- no increased DOE or activity intolerance.  Euvolemic today.  No recent falls.  Echocardiogram 4/22 showed EF of 55 to 60%.  Sustained a fall and felt unwell while on losartan and metoprolol.  Medications discontinued around 08/18/2020. Continue to increase physical activity as tolerated Heart healthy low-sodium diet-salty 6 given  Labile hypertension- BP today 150/80 .  Well-controlled at home 120-130/70-80.  She presents with blood pressure log.  Continue to monitor/maintain blood pressure log Heart healthy low-sodium diet-salty 6 given Increase physical activity as tolerated  Hyperlipidemia-03/07/2020: Cholesterol 121; HDL 48; LDL Cholesterol 51; Triglycerides 111; VLDL 22 Continue rosuvastatin Heart healthy low-sodium high-fiber diet Increase physical activity as tolerated  Type 2 diabetes-A1c 6.7.  Intolerant of Jardiance wishes to defer trial of similar medications. Continue metformin Are healthy low-sodium carb modified diet Increase physical activity as tolerated Follows with PCP  Disposition: Follow-up with Dr. Cristal Deer in 6 months.  Thomasene Ripple. Ekin Pilar NP-C    10/20/2020, 10:29 AM [facility] [address] Office [phone] Fax [phone]  Notice: This dictation was prepared with Dragon dictation along with smaller phrase technology. Any transcriptional errors that result from this process are unintentional and may not be corrected upon review.  I spent 13 minutes examining this patient, reviewing medications, and using patient centered shared decision making involving her cardiac care.  Prior to her visit I spent greater than 20 minutes reviewing her past medical history,  medications, and prior cardiac tests.

## 2020-10-19 ENCOUNTER — Telehealth: Payer: Self-pay | Admitting: General Practice

## 2020-10-19 NOTE — Telephone Encounter (Signed)
Routed to Edd Fabian NP to make aware for appt tomorrow.

## 2020-10-19 NOTE — Telephone Encounter (Signed)
New Message:    Sharmon Revere , PA from [place] called. She wantsto make sure Cheryl Mora gets this message tomorrow. This pt is coming to see [facility] tomorrow. She wants to be sure he review the lab work she ordered this week in Epic. Please pay special attention to his kidney function. She is concerned with his kidney function and him taking Amiodarone.

## 2020-10-20 ENCOUNTER — Ambulatory Visit: Payer: Medicare HMO | Admitting: General Practice

## 2020-10-20 ENCOUNTER — Other Ambulatory Visit: Payer: Self-pay

## 2020-10-20 ENCOUNTER — Encounter: Payer: Self-pay | Admitting: General Practice

## 2020-10-20 VITALS — BP 150/80 | HR 58 | Ht 63.0 in | Wt 139.2 lb

## 2020-10-20 DIAGNOSIS — I428 Other cardiomyopathies: Secondary | ICD-10-CM | POA: Diagnosis not present

## 2020-10-20 DIAGNOSIS — E119 Type 2 diabetes mellitus without complications: Secondary | ICD-10-CM | POA: Diagnosis not present

## 2020-10-20 DIAGNOSIS — I10 Essential (primary) hypertension: Secondary | ICD-10-CM

## 2020-10-20 DIAGNOSIS — I4819 Other persistent atrial fibrillation: Secondary | ICD-10-CM

## 2020-10-20 NOTE — Patient Instructions (Signed)
Medication Instructions:  The current medical regimen is effective;  continue present plan and medications as directed. Please refer to the Current Medication list given to you today.  *If you need a refill on your cardiac medications before your next appointment, please call your pharmacy*  Lab Work:   Testing/Procedures:  NONE    NONE  Special Instructions PLEASE READ AND FOLLOW SALTY 6-ATTACHED-1,800mg  daily  PLEASE INCREASE PHYSICAL ACTIVITY AS TOLERATED  Follow-Up: Your next appointment:  6 month(s) In Person with Jodelle Red, MD OR IF UNAVAILABLE JESSE CLEAVER, FNP-C   Please call our office 2 months in advance to schedule this appointment   At [facility], you and your health needs are our priority.  As part of our continuing mission to provide you with exceptional heart care, we have created designated Provider Care Teams.  These Care Teams include your primary Cardiologist (physician) and Advanced Practice Providers (APPs -  Physician Assistants and Nurse Practitioners) who all work together to provide you with the care you need, when you need it.            6 SALTY THINGS TO AVOID     1,800MG  DAILY

## 2020-11-15 ENCOUNTER — Other Ambulatory Visit: Payer: Self-pay | Admitting: Cardiology

## 2020-12-18 ENCOUNTER — Other Ambulatory Visit: Payer: Self-pay | Admitting: Cardiology

## 2020-12-18 NOTE — Telephone Encounter (Signed)
65f, 63.1kg, scr 1.16 04/06/20, lovw/cleaver 10/20/20

## 2021-03-05 ENCOUNTER — Other Ambulatory Visit: Payer: Self-pay | Admitting: *Deleted

## 2021-03-05 DIAGNOSIS — I4819 Other persistent atrial fibrillation: Secondary | ICD-10-CM

## 2021-03-05 MED ORDER — AMIODARONE HCL 200 MG PO TABS: 200.0000 mg | ORAL_TABLET | Freq: Every day | ORAL | 1 refills | Status: DC

## 2021-03-05 NOTE — Telephone Encounter (Signed)
Rx(s) sent to pharmacy electronically.  

## 2021-04-06 ENCOUNTER — Other Ambulatory Visit (HOSPITAL_BASED_OUTPATIENT_CLINIC_OR_DEPARTMENT_OTHER): Payer: Self-pay | Admitting: *Deleted

## 2021-04-06 DIAGNOSIS — I4819 Other persistent atrial fibrillation: Secondary | ICD-10-CM

## 2021-04-06 MED ORDER — AMIODARONE HCL 200 MG PO TABS: 200.0000 mg | ORAL_TABLET | Freq: Every day | ORAL | 0 refills | Status: DC

## 2021-04-24 ENCOUNTER — Other Ambulatory Visit: Payer: Self-pay

## 2021-04-24 ENCOUNTER — Ambulatory Visit (HOSPITAL_BASED_OUTPATIENT_CLINIC_OR_DEPARTMENT_OTHER): Payer: Medicare HMO | Admitting: Cardiology

## 2021-04-24 ENCOUNTER — Encounter (HOSPITAL_BASED_OUTPATIENT_CLINIC_OR_DEPARTMENT_OTHER): Payer: Self-pay | Admitting: Cardiology

## 2021-04-24 VITALS — BP 206/92 | HR 66 | Ht 61.5 in | Wt 137.0 lb

## 2021-04-24 DIAGNOSIS — I480 Paroxysmal atrial fibrillation: Secondary | ICD-10-CM | POA: Diagnosis not present

## 2021-04-24 DIAGNOSIS — R0989 Other specified symptoms and signs involving the circulatory and respiratory systems: Secondary | ICD-10-CM | POA: Diagnosis not present

## 2021-04-24 DIAGNOSIS — I2510 Atherosclerotic heart disease of native coronary artery without angina pectoris: Secondary | ICD-10-CM

## 2021-04-24 DIAGNOSIS — I160 Hypertensive urgency: Secondary | ICD-10-CM | POA: Diagnosis not present

## 2021-04-24 DIAGNOSIS — E119 Type 2 diabetes mellitus without complications: Secondary | ICD-10-CM

## 2021-04-24 DIAGNOSIS — E7800 Pure hypercholesterolemia, unspecified: Secondary | ICD-10-CM

## 2021-04-24 MED ORDER — AMLODIPINE BESYLATE 2.5 MG PO TABS: 2.5000 mg | ORAL_TABLET | Freq: Every day | ORAL | 3 refills | Status: DC

## 2021-04-24 MED ORDER — HYDRALAZINE HCL 25 MG PO TABS
25.0000 mg | ORAL_TABLET | Freq: Once | ORAL | Status: AC
  Administered 2021-04-24: 25 mg via ORAL

## 2021-04-24 NOTE — Patient Instructions (Signed)
Medication Instructions:  START: Amlodipine 2.5 mg daily  Take 5 mg amlodipine today, then take 2.5 mg daily. Take your blood pressure ideally daily but at least a few times/week so we can get a sense of your range. If your blood pressure is very high or low, please call and let us know. If you have any stroke like symptoms call 911 to be evaluated.  We gave you a dose of hydralazine one time in the office to bring it down today.  *If you need a refill on your cardiac medications before your next appointment, please call your pharmacy*   Lab Work: None ordered today   Testing/Procedures: None ordered today   Follow-Up: At [facility] you and your health needs are our priority.  As part of our continuing mission to provide you with exceptional heart care, we have created designated Provider Care Teams.  These Care Teams include your primary Cardiologist (physician) and Advanced Practice Providers (APPs -  Physician Assistants and Nurse Practitioners) who all work together to provide you with the care you need, when you need it.  We recommend signing up for the patient portal called "MyChart".  Sign up information is provided on this After Visit Summary.  MyChart is used to connect with patients for Virtual Visits (Telemedicine).  Patients are able to view lab/test results, encounter notes, upcoming appointments, etc.  Non-urgent messages can be sent to your provider as well.   To learn more about what you can do with MyChart, go to [email or web address].    Your next appointment:   6 week(s)  The format for your next appointment:   In Person  Provider:   Jodelle Red, MD

## 2021-04-24 NOTE — Telephone Encounter (Signed)
**Note De-Identified Cynthya Yam Obfuscation** Beatrice Lecher KeyLeland Johns - PA Case ID: [id number] - Rx #: [id number] Archived on April 15, 2020 Outcome Approved on April 15, 2020 Status: Approved, Coverage Starts on: 06/11/2019 12:00:00 AM, Coverage Ends on: 06/09/2021 12:00:00 AM.  Drug: Rosuvastatin Calcium 20MG  tablets Form [employer] Electronic PA Form

## 2021-04-24 NOTE — Progress Notes (Signed)
Cardiology Office Note:    Date:  04/24/2021   ID:  [name], DOB 1932/06/25, MRN [id number]  PCP:  [name], MD  Cardiologist:  [name], MD  Referring MD: [name], MD   CC: follow up  History of Present Illness:    Cheryl Mora is a 87 y.o. female with a hx of type II diabetes, hyperlipidemia, hypertension, hypothyroidism, paroxysmal atrial fibrillation who is seen for follow up today.   Cardiac history: I met her 03/02/20 as a new consult for DOE. Very concerning story, set up for outpatient cath. Cath 9/27 with NICM, reduced EF, and new afib RVR. Her medications were adjusted, and she spontaneously converted to sinus rhythm prior to discharge 03/07/20. Since then, she has returned to atrial fibrillation despite amiodarone and reattempt at cardioversion.  Today: She is accompanied with her daughter and also presents with a BP log.  Today is the highest her blood pressure has been. She took her blood pressure yesterday and it was 156/32. She says she feels fatigue whenever her blood pressure declines. Her pressure was 131/73 on Friday.   Her thyroid dosage has been reduced. She believes it could be the reason why she feels sluggish.   The plantar area/soles of her feet felt numb during the office visit but her toes are okay. The onset of her numbness was a few months ago.   No syncope, but has had a mechanical fall from slipping on some acorns.  She does not have SOB whenever doing strenuous activity like moving wood or taking in groceries, she feels tired but not out of breath. However, if she is carrying groceries up an incline she will become winded. Otherwise, she has been staying active routinely.  For her diet, she still adds salt to some of her foods, like her eggs for breakfast this morning. She adds that she has 2-3 martinis daily and 2 cups of coffee daily. She admits to being more lenient in her diet recently.  She has not been  taking her fluid pill since she has not had swelling.   She denies any palpitations, chest pain, lightheadedness, headaches, syncope, orthopnea, or PND.  Past Medical History:  Diagnosis Date   Abnormal EKG    suggestive of previous anterior wall myocardial infarction   Diabetes mellitus ([place])    Hypertension    Hypothyroidism     Past Surgical History:  Procedure Laterality Date   ABDOMINAL HYSTERECTOMY     CARDIOVERSION N/A 04/13/2020   Procedure: CARDIOVERSION;  Surgeon: Elouise Munroe, MD;  Location: [facility] ENDOSCOPY;  Service: Cardiovascular;  Laterality: N/A;   CATARACT EXTRACTION     KNEE SURGERY     LEFT HEART CATH AND CORONARY ANGIOGRAPHY N/A 03/06/2020   Procedure: LEFT HEART CATH AND CORONARY ANGIOGRAPHY;  Surgeon: Martinique, Peter M, MD;  Location: [place] CV LAB;  Service: Cardiovascular;  Laterality: N/A;    Current Medications: Current Outpatient Medications on File Prior to Visit  Medication Sig   amiodarone (PACERONE) 200 MG tablet Take 1 tablet (200 mg total) by mouth daily. Please keep upcoming appointment in November 2022 for future refills. Thank you   apixaban (ELIQUIS) 5 MG TABS tablet TAKE 1 TABLET TWICE DAILY   ascorbic acid (VITAMIN C) 250 MG CHEW Chew 250 mg by mouth 2 (two) times daily.   calcium carbonate (TUMS - DOSED IN MG ELEMENTAL CALCIUM) 500 MG chewable tablet Chew 1-2 tablets by mouth daily as needed for indigestion  or heartburn.   Calcium-Magnesium-Zinc 333-133-5 MG TABS Take 1 tablet by mouth daily.   cetirizine (ZYRTEC) 10 MG tablet Take 10 mg by mouth daily as needed for allergies.   diphenhydrAMINE (BENADRYL) 25 MG tablet Take 25 mg by mouth every 6 (six) hours as needed for allergies or sleep.   diphenhydramine-acetaminophen (TYLENOL PM) 25-500 MG TABS tablet Take 1 tablet by mouth at bedtime as needed (sleep).   dorzolamide-timolol (COSOPT) 22.3-6.8 MG/ML ophthalmic solution Place 1 drop into the left eye 2 (two) times daily.   fluticasone  (FLONASE) 50 MCG/ACT nasal spray Place 1 spray into the nose 2 (two) times daily as needed for allergies.    furosemide (LASIX) 20 MG tablet Take 1 tablet (20 mg total) by mouth as needed (for shortness of breath). (Patient taking differently: Take 20 mg by mouth daily as needed (for shortness of breath).)   Melatonin 10 MG CAPS Take 10 mg by mouth at bedtime as needed (sleep).   Menthol-Methyl Salicylate (SALONPAS PAIN RELIEF PATCH EX) Apply 1 application topically daily as needed (pain).   metFORMIN (GLUCOPHAGE-XR) 500 MG 24 hr tablet Take 500 mg by mouth 2 (two) times daily.   nitroGLYCERIN (NITROSTAT) 0.4 MG SL tablet Place 1 tablet (0.4 mg total) under the tongue every 5 (five) minutes as needed for chest pain.   Polyethyl Glycol-Propyl Glycol (SYSTANE OP) Place 1 drop into both eyes daily as needed (dryness).   rosuvastatin (CRESTOR) 20 MG tablet TAKE 1 TABLET (20 MG TOTAL) BY MOUTH DAILY.   vitamin B-12 (CYANOCOBALAMIN) 100 MCG tablet Take 100 mcg by mouth daily.   No current facility-administered medications on file prior to visit.     Allergies:   Amoxicillin-pot clavulanate, Brimonidine, Lisinopril, Brimonidine tartrate-timolol, and Zolpidem   Social History   Tobacco Use   Smoking status: Former   Smokeless tobacco: Never  Scientific laboratory technician Use: Never used  Substance Use Topics   Alcohol use: Yes    Comment: several a day   Drug use: Never    Family History: family history includes Cirrhosis in her mother; Heart attack in an other family member; Hypertension in her mother; Stroke in her mother.  ROS:   Please see the history of present illness.   (+) Fatigue Additional pertinent ROS otherwise unremarkable.   EKGs/Labs/Other Studies Reviewed:    The following studies were reviewed today:  Echo 09/20/2020:  IMPRESSIONS    1. Left ventricular ejection fraction, by estimation, is 55 to 60%. The  left ventricle has normal function. The left ventricle has no  regional  wall motion abnormalities. There is mild left ventricular hypertrophy.  Left ventricular diastolic parameters  are indeterminate.   2. Right ventricular systolic function is normal. The right ventricular  size is normal. There is normal pulmonary artery systolic pressure. The  estimated right ventricular systolic pressure is 87.8 mmHg.   3. Left atrial size was moderately dilated.   4. Right atrial size was mildly dilated.   5. The mitral valve is normal in structure. Moderate mitral valve  regurgitation. No evidence of mitral stenosis.   6. The aortic valve is tricuspid. Aortic valve regurgitation is not  visualized. Mild aortic valve sclerosis is present, with no evidence of  aortic valve stenosis.   7. The inferior vena cava is normal in size with greater than 50%  respiratory variability, suggesting right atrial pressure of 3 mmHg.   8. The patient was in atrial fibrillation.   LHC 03/06/20 Prox  RCA lesion is 30% stenosed. There is severe left ventricular systolic dysfunction. LV end diastolic pressure is normal. The left ventricular ejection fraction is 25-35% by visual estimate.   1. Mild nonobstructive CAD 2. Severe LV dysfunction with pattern consistent with Takotsubo's cardiomyopathy. EF 30% 3. Normal LVEDP 4. New onset Atrial fibrillation compared to yesterday.   Plan: admit to telemetry. Will initiate IV heparin. May consider transition to NOAC tomorrow. Hold amlodipine due to hypotension. Switch atenolol to metoprolol. Check Echo.   Echo 03/06/20  1. Left ventricular ejection fraction, by estimation, is 25 to 30%. The  left ventricle has severely decreased function. The left ventricle  demonstrates global hypokinesis. Left ventricular diastolic function could  not be evaluated.   2. Right ventricular systolic function is normal. The right ventricular  size is normal. There is normal pulmonary artery systolic pressure. The  estimated right ventricular systolic  pressure is 93.2 mmHg.   3. Left atrial size was moderately dilated.   4. Right atrial size was mildly dilated.   5. There is no evidence of cardiac tamponade.   6. The mitral valve is normal in structure. Mild mitral valve  regurgitation. No evidence of mitral stenosis. Moderate mitral annular  calcification.   7. The aortic valve is normal in structure. Aortic valve regurgitation is  trivial. Mild aortic valve sclerosis is present, with no evidence of  aortic valve stenosis.   8. The inferior vena cava is normal in size with greater than 50%  respiratory variability, suggesting right atrial pressure of 3 mmHg.  EKG:  EKG is personally reviewed.  04/24/2021: NSR at 66 bpm 08/23/2020: atrial fibrillation at 90 bpm  Recent Labs: No results found for requested labs within last 8760 hours.  Recent Lipid Panel    Component Value Date/Time   CHOL 121 03/07/2020 1150   TRIG 111 03/07/2020 1150   HDL 48 03/07/2020 1150   CHOLHDL 2.5 03/07/2020 1150   VLDL 22 03/07/2020 1150   LDLCALC 51 03/07/2020 1150    Physical Exam:    VS:  BP (!) 206/92 (BP Location: Right Arm, Patient Position: Sitting, Cuff Size: Small)   Pulse 66   Ht 5' 1.5" (1.562 m)   Wt 137 lb (62.1 kg)   BMI 25.47 kg/m     Wt Readings from Last 3 Encounters:  04/24/21 137 lb (62.1 kg)  10/20/20 139 lb 3.2 oz (63.1 kg)  08/23/20 141 lb 12.8 oz (64.3 kg)    GEN: Well nourished, well developed in no acute distress HEENT: Normal, moist mucous membranes NECK: No JVD CARDIAC: regular rhythm, normal S1 and S2, no rubs or gallops. No murmur. VASCULAR: Radial and DP pulses 2+ bilaterally. No carotid bruits RESPIRATORY:  Clear to auscultation without rales, wheezing or rhonchi  ABDOMEN: Soft, non-tender, non-distended MUSCULOSKELETAL:  Ambulates independently SKIN: Warm and dry, no edema NEUROLOGIC:  Alert and oriented x 3. No focal neuro deficits noted. PSYCHIATRIC:  Normal affect    ASSESSMENT:    1. Labile  hypertension   2. Asymptomatic hypertensive urgency   3. Paroxysmal atrial fibrillation (HCC)   4. Nonocclusive coronary atherosclerosis of native coronary artery   5. Type 2 diabetes mellitus without complication, without long-term current use of insulin (HCC)   6. Pure hypercholesterolemia     PLAN:    Labile hypertension/hypotension Asymptomatic hypertensive urgency today -difficult situation as medications have needed to be cut back in the past due to hypotension -suspect dietary indiscretion is partially involved, but based on  home readings appears she has had rising BP with only rare lows -gave hydralazine in the office today and monitored -will restart amlodipine, low dose given lability in BP. Would take 5 mg today, then 2.5 mg daily -she will monitor her BP at home and contact me with high/low numbers -counseled on red flag warning signs that need immediate medical attention  Atrial fibrillation, paroxysmal -in sinus rhythm, continue amiodarone -CHA2DS2/VAS Stroke Risk Points=  6. -continue apixaban.  Nonischemic cardiomyopathy, with recovered EF -tried jardiance, felt terrible. Refuses to reattempt this or similar medications -did not tolerate losartan or metoprolol well previously. Labile BP, as above  Nonobstructive CAD Hypercholesterolemia -goal LDL <70 -lipids reviewed, LDL 51 -continue rosuvastatin  Type II diabetes: -A1c 6.7 -on metformin -did not tolerate Jardiance, made her feel terrible, refuses to trial similar  Cardiac risk counseling and prevention recommendations: -recommend heart healthy/Mediterranean diet, with whole grains, fruits, vegetable, fish, lean meats, nuts, and olive oil. Limit salt. -recommend moderate walking, 3-5 times/week for 30-50 minutes each session. Aim for at least 150 minutes.week. Goal should be pace of 3 miles/hours, or walking 1.5 miles in 30 minutes -recommend avoidance of tobacco products. Avoid excess alcohol.  Plan for  follow up: 6 weeks   High risk condition (hypertensive urgency) requiring treatment in clinic with hydralazine and close monitoring. High complexity medical decision making given her complex history  [name], MD, PhD, [facility]   Medication Adjustments/Labs and Tests Ordered: Current medicines are reviewed at length with the patient today.  Concerns regarding medicines are outlined above.  Orders Placed This Encounter  Procedures   EKG 12-Lead    Meds ordered this encounter  Medications   amLODipine (NORVASC) 2.5 MG tablet    Sig: Take 1 tablet (2.5 mg total) by mouth daily.    Dispense:  90 tablet    Refill:  3   hydrALAZINE (APRESOLINE) tablet 25 mg    Patient Instructions  Medication Instructions:  START: Amlodipine 2.5 mg daily  Take 5 mg amlodipine today, then take 2.5 mg daily. Take your blood pressure ideally daily but at least a few times/week so we can get a sense of your range. If your blood pressure is very high or low, please call and let us know. If you have any stroke like symptoms call 911 to be evaluated.  We gave you a dose of hydralazine one time in the office to bring it down today.  *If you need a refill on your cardiac medications before your next appointment, please call your pharmacy*   Lab Work: None ordered today   Testing/Procedures: None ordered today   Follow-Up: At [facility], you and your health needs are our priority.  As part of our continuing mission to provide you with exceptional heart care, we have created designated Provider Care Teams.  These Care Teams include your primary Cardiologist (physician) and Advanced Practice Providers (APPs -  Physician Assistants and Nurse Practitioners) who all work together to provide you with the care you need, when you need it.  We recommend signing up for the patient portal called "MyChart".  Sign up information is provided on this After Visit Summary.   MyChart is used to connect with patients for Virtual Visits (Telemedicine).  Patients are able to view lab/test results, encounter notes, upcoming appointments, etc.  Non-urgent messages can be sent to your provider as well.   To learn more about what you can do with MyChart, go to [email or web address].  Your next appointment:   6 week(s)  The format for your next appointment:   In Person  Provider:   [name], MD       Rondell Reams as a scribe for [name], MD.,have documented all relevant documentation on the behalf of [name], MD,as directed by  [name], MD while in the presence of [name], MD.   I, [name], MD, have reviewed all documentation for this visit. The documentation on 04/24/21 for the exam, diagnosis, procedures, and orders are all accurate and complete.   Signed, [name], MD PhD 04/24/2021  [place]

## 2021-06-01 ENCOUNTER — Other Ambulatory Visit: Payer: Self-pay

## 2021-06-01 ENCOUNTER — Ambulatory Visit (HOSPITAL_BASED_OUTPATIENT_CLINIC_OR_DEPARTMENT_OTHER): Payer: Medicare HMO | Admitting: Cardiology

## 2021-06-01 VITALS — BP 200/88 | HR 63 | Ht 62.0 in | Wt 135.9 lb

## 2021-06-01 DIAGNOSIS — I2510 Atherosclerotic heart disease of native coronary artery without angina pectoris: Secondary | ICD-10-CM | POA: Diagnosis not present

## 2021-06-01 DIAGNOSIS — R0989 Other specified symptoms and signs involving the circulatory and respiratory systems: Secondary | ICD-10-CM | POA: Diagnosis not present

## 2021-06-01 DIAGNOSIS — I480 Paroxysmal atrial fibrillation: Secondary | ICD-10-CM | POA: Diagnosis not present

## 2021-06-01 DIAGNOSIS — E119 Type 2 diabetes mellitus without complications: Secondary | ICD-10-CM

## 2021-06-01 DIAGNOSIS — E7800 Pure hypercholesterolemia, unspecified: Secondary | ICD-10-CM

## 2021-06-01 NOTE — Patient Instructions (Signed)
Medication Instructions:  Your Physician recommend you continue on your current medication as directed.    *If you need a refill on your cardiac medications before your next appointment, please call your pharmacy*   Lab Work: None ordered today   Testing/Procedures: None ordered today   Follow-Up: At [facility], you and your health needs are our priority.  As part of our continuing mission to provide you with exceptional heart care, we have created designated Provider Care Teams.  These Care Teams include your primary Cardiologist (physician) and Advanced Practice Providers (APPs -  Physician Assistants and Nurse Practitioners) who all work together to provide you with the care you need, when you need it.  We recommend signing up for the patient portal called "MyChart".  Sign up information is provided on this After Visit Summary.  MyChart is used to connect with patients for Virtual Visits (Telemedicine).  Patients are able to view lab/test results, encounter notes, upcoming appointments, etc.  Non-urgent messages can be sent to your provider as well.   To learn more about what you can do with MyChart, go to [email or web address].    Your next appointment:   2 month(s)  The format for your next appointment:   In Person  Provider:   Bridgette Christopher, MD{      

## 2021-06-01 NOTE — Progress Notes (Signed)
Cardiology Office Note:    Date:  06/01/2021   ID:  Cheryl Mora, Cheryl Mora 01-16-1933, MRN [id number]  PCP:  [name], MD  Cardiologist:  [name], MD  Referring MD: [name], MD   CC: follow up  History of Present Illness:    Cheryl Mora is a 87 y.o. female with a hx of type II diabetes, hyperlipidemia, hypertension, hypothyroidism, paroxysmal atrial fibrillation who is seen for follow up today.   Cardiac history: I met her 03/02/20 as a new consult for [facility]. Very concerning story, set up for outpatient cath. Cath 9/27 with NICM, reduced EF, and new afib RVR. Her medications were adjusted, and she spontaneously converted to sinus rhythm prior to discharge 03/07/20. Since then, she has returned to atrial fibrillation despite amiodarone and reattempt at cardioversion.  Today: Has been feeling well overall. Brings list of home BP numbers.  Range had been 93/54-169/83. Most have been 130s/60s-70s on average, no clear pattern to when it is elevated and when it is not, other than it is always high in the doctor's office. 135/75 this morning on her home cuff. We have validated this cuff before.   Stopped amlodipine on her own, when she noted BP in the 65L systolic intermittently. She was on 5 mg amlodipine after high BP with Dr. Melford Aase, but then felt really poorly.   Planning to go into [facility], but cost is very high. She is concerned that if she is going to die in a few months, then it's not worth it. We discussed that there is no way to know this.   Stopped rosuvastatin about a month ago after being concerned about muscle weakness. These symptoms have improved. Planning to follow up with her doctor about this. We did discuss the data regarding rebound MI with cessation of statin.   We reviewed her labs from [facility] 11/29. Her LFTs are very mildly elevated (and have been mildly elevated in the past) but otherwise labs are stable. Lipids were excellent (LDL 52,  TG 57) but she was still on a statin.  Past Medical History:  Diagnosis Date   Abnormal EKG    suggestive of previous anterior wall myocardial infarction   Diabetes mellitus ([place])    Hypertension    Hypothyroidism     Past Surgical History:  Procedure Laterality Date   ABDOMINAL HYSTERECTOMY     CARDIOVERSION N/A 04/13/2020   Procedure: CARDIOVERSION;  Surgeon: Elouise Munroe, MD;  Location: [facility] ENDOSCOPY;  Service: Cardiovascular;  Laterality: N/A;   CATARACT EXTRACTION     KNEE SURGERY     LEFT HEART CATH AND CORONARY ANGIOGRAPHY N/A 03/06/2020   Procedure: LEFT HEART CATH AND CORONARY ANGIOGRAPHY;  Surgeon: Martinique, Peter M, MD;  Location: [facility] LAB;  Service: Cardiovascular;  Laterality: N/A;    Current Medications: Current Outpatient Medications on File Prior to Visit  Medication Sig   amiodarone (PACERONE) 200 MG tablet Take 1 tablet (200 mg total) by mouth daily. Please keep upcoming appointment in November 2022 for future refills. Thank you   apixaban (ELIQUIS) 5 MG TABS tablet TAKE 1 TABLET TWICE DAILY   ascorbic acid (VITAMIN C) 250 MG CHEW Chew 250 mg by mouth 2 (two) times daily.   calcium carbonate (TUMS - DOSED IN MG ELEMENTAL CALCIUM) 500 MG chewable tablet Chew 1-2 tablets by mouth daily as needed for indigestion or heartburn.   Calcium-Magnesium-Zinc 333-133-5 MG TABS Take 1 tablet by mouth daily.   cetirizine (  ZYRTEC) 10 MG tablet Take 10 mg by mouth daily as needed for allergies.   diphenhydrAMINE (BENADRYL) 25 MG tablet Take 25 mg by mouth every 6 (six) hours as needed for allergies or sleep.   diphenhydramine-acetaminophen (TYLENOL PM) 25-500 MG TABS tablet Take 1 tablet by mouth at bedtime as needed (sleep).   dorzolamide-timolol (COSOPT) 22.3-6.8 MG/ML ophthalmic solution Place 1 drop into the left eye 2 (two) times daily.   fluticasone (FLONASE) 50 MCG/ACT nasal spray Place 1 spray into the nose 2 (two) times daily as needed for allergies.     furosemide (LASIX) 20 MG tablet Take 1 tablet (20 mg total) by mouth as needed (for shortness of breath). (Patient taking differently: Take 20 mg by mouth daily as needed (for shortness of breath).)   levothyroxine (SYNTHROID) 75 MCG tablet Take 75 mcg by mouth daily before breakfast.   melatonin 5 MG TABS Take 5 mg by mouth at bedtime as needed (sleep).   Menthol-Methyl Salicylate (SALONPAS PAIN RELIEF PATCH EX) Apply 1 application topically daily as needed (pain).   metFORMIN (GLUCOPHAGE-XR) 500 MG 24 hr tablet Take 500 mg by mouth 2 (two) times daily.   nitroGLYCERIN (NITROSTAT) 0.4 MG SL tablet Place 1 tablet (0.4 mg total) under the tongue every 5 (five) minutes as needed for chest pain.   Polyethyl Glycol-Propyl Glycol (SYSTANE OP) Place 1 drop into both eyes daily as needed (dryness).   vitamin B-12 (CYANOCOBALAMIN) 100 MCG tablet Take 100 mcg by mouth daily.   amLODipine (NORVASC) 2.5 MG tablet Take 1 tablet (2.5 mg total) by mouth daily. (Patient not taking: Reported on 06/01/2021)   rosuvastatin (CRESTOR) 20 MG tablet TAKE 1 TABLET (20 MG TOTAL) BY MOUTH DAILY. (Patient not taking: Reported on 06/01/2021)   No current facility-administered medications on file prior to visit.     Allergies:   Amoxicillin-pot clavulanate, Brimonidine, Lisinopril, Brimonidine tartrate-timolol, and Zolpidem   Social History   Tobacco Use   Smoking status: Former   Smokeless tobacco: Never  Scientific laboratory technician Use: Never used  Substance Use Topics   Alcohol use: Yes    Comment: several a day   Drug use: Never    Family History: family history includes Cirrhosis in her mother; Heart attack in an other family member; Hypertension in her mother; Stroke in her mother.  ROS:   Please see the history of present illness.   Additional pertinent ROS otherwise unremarkable.   EKGs/Labs/Other Studies Reviewed:    The following studies were reviewed today:  Echo 09/20/2020:  IMPRESSIONS    1. Left  ventricular ejection fraction, by estimation, is 55 to 60%. The  left ventricle has normal function. The left ventricle has no regional  wall motion abnormalities. There is mild left ventricular hypertrophy.  Left ventricular diastolic parameters  are indeterminate.   2. Right ventricular systolic function is normal. The right ventricular  size is normal. There is normal pulmonary artery systolic pressure. The  estimated right ventricular systolic pressure is 41.9 mmHg.   3. Left atrial size was moderately dilated.   4. Right atrial size was mildly dilated.   5. The mitral valve is normal in structure. Moderate mitral valve  regurgitation. No evidence of mitral stenosis.   6. The aortic valve is tricuspid. Aortic valve regurgitation is not  visualized. Mild aortic valve sclerosis is present, with no evidence of  aortic valve stenosis.   7. The inferior vena cava is normal in size with greater than 50%  respiratory variability, suggesting right atrial pressure of 3 mmHg.   8. The patient was in atrial fibrillation.   LHC 03/06/20 Prox RCA lesion is 30% stenosed. There is severe left ventricular systolic dysfunction. LV end diastolic pressure is normal. The left ventricular ejection fraction is 25-35% by visual estimate.   1. Mild nonobstructive CAD 2. Severe LV dysfunction with pattern consistent with Takotsubo's cardiomyopathy. EF 30% 3. Normal LVEDP 4. New onset Atrial fibrillation compared to yesterday.   Plan: admit to telemetry. Will initiate IV heparin. May consider transition to NOAC tomorrow. Hold amlodipine due to hypotension. Switch atenolol to metoprolol. Check Echo.   Echo 03/06/20  1. Left ventricular ejection fraction, by estimation, is 25 to 30%. The  left ventricle has severely decreased function. The left ventricle  demonstrates global hypokinesis. Left ventricular diastolic function could  not be evaluated.   2. Right ventricular systolic function is normal. The  right ventricular  size is normal. There is normal pulmonary artery systolic pressure. The  estimated right ventricular systolic pressure is 17.7 mmHg.   3. Left atrial size was moderately dilated.   4. Right atrial size was mildly dilated.   5. There is no evidence of cardiac tamponade.   6. The mitral valve is normal in structure. Mild mitral valve  regurgitation. No evidence of mitral stenosis. Moderate mitral annular  calcification.   7. The aortic valve is normal in structure. Aortic valve regurgitation is  trivial. Mild aortic valve sclerosis is present, with no evidence of  aortic valve stenosis.   8. The inferior vena cava is normal in size with greater than 50%  respiratory variability, suggesting right atrial pressure of 3 mmHg.  EKG:  EKG is personally reviewed.  04/24/2021: NSR at 66 bpm 08/23/2020: atrial fibrillation at 90 bpm  Recent Labs: No results found for requested labs within last 8760 hours.  Recent Lipid Panel    Component Value Date/Time   CHOL 121 03/07/2020 1150   TRIG 111 03/07/2020 1150   HDL 48 03/07/2020 1150   CHOLHDL 2.5 03/07/2020 1150   VLDL 22 03/07/2020 1150   LDLCALC 51 03/07/2020 1150    Physical Exam:    VS:  BP (!) 200/88 (BP Location: Right Arm, Patient Position: Sitting)    Pulse 63    Ht $R'5\' 2"'Ml$  (1.575 m)    Wt 135 lb 14.4 oz (61.6 kg)    SpO2 93%    BMI 24.86 kg/m     Wt Readings from Last 3 Encounters:  06/01/21 135 lb 14.4 oz (61.6 kg)  04/24/21 137 lb (62.1 kg)  10/20/20 139 lb 3.2 oz (63.1 kg)    GEN: Well nourished, well developed in no acute distress HEENT: Normal, moist mucous membranes NECK: No JVD CARDIAC: regular rhythm, normal S1 and S2, no rubs or gallops. No murmur. VASCULAR: Radial and DP pulses 2+ bilaterally. No carotid bruits RESPIRATORY:  Clear to auscultation without rales, wheezing or rhonchi  ABDOMEN: Soft, non-tender, non-distended MUSCULOSKELETAL:  Ambulates independently SKIN: Warm and dry, no  edema NEUROLOGIC:  Alert and oriented x 3. No focal neuro deficits noted. PSYCHIATRIC:  Normal affect    ASSESSMENT:    1. Labile hypertension   2. Paroxysmal atrial fibrillation (HCC)   3. Nonocclusive coronary atherosclerosis of native coronary artery   4. Type 2 diabetes mellitus without complication, without long-term current use of insulin ([place])   5. Pure hypercholesterolemia      PLAN:    Labile hypertension/hypotension Asymptomatic elevated blood  pressures today -we reviewed her home logs, and we have validated her home cuff before. She has some lability (both high and low), but her average is at goal.  -difficult situation as medications have needed to be cut back in the past due to hypotension -suspect dietary indiscretion is partially involved, but based on home readings appears she has had rising BP with only rare lows -tolerating amlodipine 2.5 mg daily -she will monitor her BP at home and contact me with high/low numbers -counseled on red flag warning signs that need immediate medical attention  Atrial fibrillation, paroxysmal -in sinus rhythm, continue amiodarone -CHA2DS2/VAS Stroke Risk Points=  6. -continue apixaban.  Nonischemic cardiomyopathy, with recovered EF -tried jardiance, felt terrible. Refuses to reattempt this or similar medications -did not tolerate losartan or metoprolol well previously. Labile BP, as above  Nonobstructive CAD Hypercholesterolemia -goal LDL <70 -lipids reviewed, LDL 51 -continue rosuvastatin  Type II diabetes: -A1c 6.7 -on metformin -did not tolerate Jardiance, made her feel terrible, refuses to trial similar  Cardiac risk counseling and prevention recommendations: -recommend heart healthy/Mediterranean diet, with whole grains, fruits, vegetable, fish, lean meats, nuts, and olive oil. Limit salt. -recommend moderate walking, 3-5 times/week for 30-50 minutes each session. Aim for at least 150 minutes.week. Goal should be pace  of 3 miles/hours, or walking 1.5 miles in 30 minutes -recommend avoidance of tobacco products. Avoid excess alcohol.  Plan for follow up: 2 mos  [name], MD, PhD, [facility]   Medication Adjustments/Labs and Tests Ordered: Current medicines are reviewed at length with the patient today.  Concerns regarding medicines are outlined above.  No orders of the defined types were placed in this encounter.   No orders of the defined types were placed in this encounter.   Patient Instructions  Medication Instructions:  Your Physician recommend you continue on your current medication as directed.    *If you need a refill on your cardiac medications before your next appointment, please call your pharmacy*   Lab Work: None ordered today   Testing/Procedures: None ordered today   Follow-Up: At [facility], you and your health needs are our priority.  As part of our continuing mission to provide you with exceptional heart care, we have created designated Provider Care Teams.  These Care Teams include your primary Cardiologist (physician) and Advanced Practice Providers (APPs -  Physician Assistants and Nurse Practitioners) who all work together to provide you with the care you need, when you need it.  We recommend signing up for the patient portal called "MyChart".  Sign up information is provided on this After Visit Summary.  MyChart is used to connect with patients for Virtual Visits (Telemedicine).  Patients are able to view lab/test results, encounter notes, upcoming appointments, etc.  Non-urgent messages can be sent to your provider as well.   To learn more about what you can do with MyChart, go to [email or web address].    Your next appointment:   2 month(s)  The format for your next appointment:   In Person  Provider:   [name], MD       Signed, [name], MD PhD 06/01/2021  [place]

## 2021-06-14 ENCOUNTER — Encounter (HOSPITAL_BASED_OUTPATIENT_CLINIC_OR_DEPARTMENT_OTHER): Payer: Self-pay | Admitting: Cardiology

## 2021-08-01 ENCOUNTER — Other Ambulatory Visit: Payer: Self-pay | Admitting: Cardiology

## 2021-08-01 DIAGNOSIS — I4819 Other persistent atrial fibrillation: Secondary | ICD-10-CM

## 2021-08-02 NOTE — Telephone Encounter (Signed)
Rx(s) sent to pharmacy electronically.  

## 2021-08-03 ENCOUNTER — Ambulatory Visit (HOSPITAL_BASED_OUTPATIENT_CLINIC_OR_DEPARTMENT_OTHER): Payer: Medicare HMO | Admitting: Cardiology

## 2021-08-03 ENCOUNTER — Other Ambulatory Visit: Payer: Self-pay

## 2021-08-03 VITALS — BP 168/78 | HR 62 | Ht 62.0 in | Wt 135.8 lb

## 2021-08-03 DIAGNOSIS — I480 Paroxysmal atrial fibrillation: Secondary | ICD-10-CM | POA: Diagnosis not present

## 2021-08-03 DIAGNOSIS — E119 Type 2 diabetes mellitus without complications: Secondary | ICD-10-CM

## 2021-08-03 DIAGNOSIS — R0989 Other specified symptoms and signs involving the circulatory and respiratory systems: Secondary | ICD-10-CM | POA: Diagnosis not present

## 2021-08-03 DIAGNOSIS — I428 Other cardiomyopathies: Secondary | ICD-10-CM | POA: Diagnosis not present

## 2021-08-03 DIAGNOSIS — I2510 Atherosclerotic heart disease of native coronary artery without angina pectoris: Secondary | ICD-10-CM | POA: Diagnosis not present

## 2021-08-03 DIAGNOSIS — E7800 Pure hypercholesterolemia, unspecified: Secondary | ICD-10-CM

## 2021-08-03 NOTE — Progress Notes (Incomplete)
Cardiology Office Note:    Date:  08/03/2021   ID:  Cheryl, Mora [birth year], MRN [id number]  PCP:  [name], MD  Cardiologist:  [name], MD  Referring MD: [name], MD   CC: follow up  History of Present Illness:    Cheryl Mora is a 88 y.o. female with a hx of type II diabetes, hyperlipidemia, hypertension, hypothyroidism, paroxysmal atrial fibrillation who is seen for follow up today.   Cardiac history: I met her 03/02/20 as a new consult for DOE. Very concerning story, set up for outpatient cath. Cath 9/27 with NICM, reduced EF, and new afib RVR. Her medications were adjusted, and she spontaneously converted to sinus rhythm prior to discharge 03/07/20. Since then, she has returned to atrial fibrillation despite amiodarone and reattempt at cardioversion.  We reviewed her labs from [facility] 11/29. Her LFTs are very mildly elevated (and have been mildly elevated in the past) but otherwise labs are stable. Lipids were excellent (LDL 52, TG 57) but she was still on a statin.  Today: She is accompanied by a family member. Overall, she appears well. She presents a BP log which shows well controlled readings on average, 120s-130s. It has been as low as 107/61, she notes not feeling well at that time. Also, she has self-increased her amlodipine to 5 mg.   Recently, she suffered from a mechanical fall. A week ago she tripped over her feet and collided with a table. She has a large ***abrasive wound with ecchymosis/erythema on her frontal LLE below the knee.  When climbing a hill while carrying groceries she becomes winded. Otherwise she generally feels well while walking.  She denies any palpitations, chest pain, or peripheral edema. No lightheadedness, headaches, syncope, orthopnea, or PND. No hematuria or hematochezia.   Past Medical History:  Diagnosis Date   Abnormal EKG    suggestive of previous anterior wall myocardial infarction   Diabetes  mellitus ([place])    Hypertension    Hypothyroidism     Past Surgical History:  Procedure Laterality Date   ABDOMINAL HYSTERECTOMY     CARDIOVERSION N/A 04/13/2020   Procedure: CARDIOVERSION;  Surgeon: Elouise Munroe, MD;  Location: [facility] ENDOSCOPY;  Service: Cardiovascular;  Laterality: N/A;   CATARACT EXTRACTION     KNEE SURGERY     LEFT HEART CATH AND CORONARY ANGIOGRAPHY N/A 03/06/2020   Procedure: LEFT HEART CATH AND CORONARY ANGIOGRAPHY;  Surgeon: Martinique, Peter M, MD;  Location: [place] CV LAB;  Service: Cardiovascular;  Laterality: N/A;    Current Medications: Current Outpatient Medications on File Prior to Visit  Medication Sig   amiodarone (PACERONE) 200 MG tablet TAKE 1 TABLET EVERY DAY   amLODipine (NORVASC) 5 MG tablet Take 5 mg by mouth daily.   apixaban (ELIQUIS) 5 MG TABS tablet TAKE 1 TABLET TWICE DAILY   ascorbic acid (VITAMIN C) 250 MG CHEW Chew 250 mg by mouth 2 (two) times daily.   calcium carbonate (TUMS - DOSED IN MG ELEMENTAL CALCIUM) 500 MG chewable tablet Chew 1-2 tablets by mouth daily as needed for indigestion or heartburn.   Calcium-Magnesium-Zinc 333-133-5 MG TABS Take 1 tablet by mouth daily.   cetirizine (ZYRTEC) 10 MG tablet Take 10 mg by mouth daily as needed for allergies.   diphenhydrAMINE (BENADRYL) 25 MG tablet Take 25 mg by mouth every 6 (six) hours as needed for allergies or sleep.   diphenhydramine-acetaminophen (TYLENOL PM) 25-500 MG TABS tablet Take 1 tablet by mouth  at bedtime as needed (sleep).   dorzolamide-timolol (COSOPT) 22.3-6.8 MG/ML ophthalmic solution Place 1 drop into the left eye 2 (two) times daily.   fluticasone (FLONASE) 50 MCG/ACT nasal spray Place 1 spray into the nose 2 (two) times daily as needed for allergies.    furosemide (LASIX) 20 MG tablet Take 1 tablet (20 mg total) by mouth as needed (for shortness of breath).   levothyroxine (SYNTHROID) 75 MCG tablet Take 75 mcg by mouth daily before breakfast.   melatonin 5 MG TABS  Take 5 mg by mouth at bedtime as needed (sleep).   Menthol-Methyl Salicylate (SALONPAS PAIN RELIEF PATCH EX) Apply 1 application topically daily as needed (pain).   nitroGLYCERIN (NITROSTAT) 0.4 MG SL tablet Place 1 tablet (0.4 mg total) under the tongue every 5 (five) minutes as needed for chest pain.   Polyethyl Glycol-Propyl Glycol (SYSTANE OP) Place 1 drop into both eyes daily as needed (dryness).   rosuvastatin (CRESTOR) 20 MG tablet TAKE 1 TABLET (20 MG TOTAL) BY MOUTH DAILY.   vitamin B-12 (CYANOCOBALAMIN) 100 MCG tablet Take 100 mcg by mouth daily.   No current facility-administered medications on file prior to visit.     Allergies:   Amoxicillin-pot clavulanate, Brimonidine, Lisinopril, Brimonidine tartrate-timolol, and Zolpidem   Social History   Tobacco Use   Smoking status: Former   Smokeless tobacco: Never  Scientific laboratory technician Use: Never used  Substance Use Topics   Alcohol use: Yes    Comment: several a day   Drug use: Never    Family History: family history includes Cirrhosis in her mother; Heart attack in an other family member; Hypertension in her mother; Stroke in her mother.  ROS:   Please see the history of present illness.   (+) Mechanical fall (+) LLE frontal wound (+) Exertional shortness of breath Additional pertinent ROS otherwise unremarkable.   EKGs/Labs/Other Studies Reviewed:    The following studies were reviewed today:  Echo 09/20/2020:  IMPRESSIONS    1. Left ventricular ejection fraction, by estimation, is 55 to 60%. The  left ventricle has normal function. The left ventricle has no regional  wall motion abnormalities. There is mild left ventricular hypertrophy.  Left ventricular diastolic parameters  are indeterminate.   2. Right ventricular systolic function is normal. The right ventricular  size is normal. There is normal pulmonary artery systolic pressure. The  estimated right ventricular systolic pressure is 01.0 mmHg.   3. Left  atrial size was moderately dilated.   4. Right atrial size was mildly dilated.   5. The mitral valve is normal in structure. Moderate mitral valve  regurgitation. No evidence of mitral stenosis.   6. The aortic valve is tricuspid. Aortic valve regurgitation is not  visualized. Mild aortic valve sclerosis is present, with no evidence of  aortic valve stenosis.   7. The inferior vena cava is normal in size with greater than 50%  respiratory variability, suggesting right atrial pressure of 3 mmHg.   8. The patient was in atrial fibrillation.   LHC 03/06/20 Prox RCA lesion is 30% stenosed. There is severe left ventricular systolic dysfunction. LV end diastolic pressure is normal. The left ventricular ejection fraction is 25-35% by visual estimate.   1. Mild nonobstructive CAD 2. Severe LV dysfunction with pattern consistent with Takotsubo's cardiomyopathy. EF 30% 3. Normal LVEDP 4. New onset Atrial fibrillation compared to yesterday.   Plan: admit to telemetry. Will initiate IV heparin. May consider transition to NOAC tomorrow. Hold amlodipine  due to hypotension. Switch atenolol to metoprolol. Check Echo.   Echo 03/06/20  1. Left ventricular ejection fraction, by estimation, is 25 to 30%. The  left ventricle has severely decreased function. The left ventricle  demonstrates global hypokinesis. Left ventricular diastolic function could  not be evaluated.   2. Right ventricular systolic function is normal. The right ventricular  size is normal. There is normal pulmonary artery systolic pressure. The  estimated right ventricular systolic pressure is 47.4 mmHg.   3. Left atrial size was moderately dilated.   4. Right atrial size was mildly dilated.   5. There is no evidence of cardiac tamponade.   6. The mitral valve is normal in structure. Mild mitral valve  regurgitation. No evidence of mitral stenosis. Moderate mitral annular  calcification.   7. The aortic valve is normal in structure.  Aortic valve regurgitation is  trivial. Mild aortic valve sclerosis is present, with no evidence of  aortic valve stenosis.   8. The inferior vena cava is normal in size with greater than 50%  respiratory variability, suggesting right atrial pressure of 3 mmHg.  EKG:  EKG is personally reviewed.  08/03/2021: *** 04/24/2021: NSR at 66 bpm 08/23/2020: atrial fibrillation at 90 bpm  Recent Labs: No results found for requested labs within last 8760 hours.   Recent Lipid Panel    Component Value Date/Time   CHOL 121 03/07/2020 1150   TRIG 111 03/07/2020 1150   HDL 48 03/07/2020 1150   CHOLHDL 2.5 03/07/2020 1150   VLDL 22 03/07/2020 1150   LDLCALC 51 03/07/2020 1150    Physical Exam:    VS:  BP (!) 168/78    Pulse 62    Ht _0  (1.575 m)    Wt 135 lb 12.8 oz (61.6 kg)    SpO2 93%    BMI 24.84 kg/m     Wt Readings from Last 3 Encounters:  08/03/21 135 lb 12.8 oz (61.6 kg)  06/01/21 135 lb 14.4 oz (61.6 kg)  04/24/21 137 lb (62.1 kg)    GEN: Well nourished, well developed in no acute distress HEENT: Normal, moist mucous membranes NECK: No JVD CARDIAC: regular rhythm, normal S1 and S2, no rubs or gallops. No murmur. VASCULAR: Radial and DP pulses 2+ bilaterally. No carotid bruits RESPIRATORY:  Clear to auscultation without rales, wheezing or rhonchi  ABDOMEN: Soft, non-tender, non-distended MUSCULOSKELETAL:  Ambulates independently SKIN: Warm and dry, no edema. ***Large abrasive wound with ecchymosis/erythema on frontal LLE inferior to knee NEUROLOGIC:  Alert and oriented x 3. No focal neuro deficits noted. PSYCHIATRIC:  Normal affect    ASSESSMENT:    No diagnosis found.  PLAN:    Labile hypertension/hypotension Asymptomatic elevated blood pressures today -we reviewed her home logs, and we have validated her home cuff before. She has some lability (both high and low), but her average is at goal.  -difficult situation as medications have needed to be cut back in the  past due to hypotension -suspect dietary indiscretion is partially involved, but based on home readings appears she has had rising BP with only rare lows -tolerating amlodipine 2.5 mg daily -she will monitor her BP at home and contact me with high/low numbers -counseled on red flag warning signs that need immediate medical attention  Atrial fibrillation, paroxysmal -in sinus rhythm, continue amiodarone -CHA2DS2/VAS Stroke Risk Points=  6. -continue apixaban.  Nonischemic cardiomyopathy, with recovered EF -tried jardiance, felt terrible. Refuses to reattempt this or similar medications -did not tolerate  losartan or metoprolol well previously. Labile BP, as above  Nonobstructive CAD Hypercholesterolemia -goal LDL <70 -lipids reviewed, LDL 51 -continue rosuvastatin  Type II diabetes: -A1c 6.7 -on metformin -did not tolerate Jardiance, made her feel terrible, refuses to trial similar  Cardiac risk counseling and prevention recommendations: -recommend heart healthy/Mediterranean diet, with whole grains, fruits, vegetable, fish, lean meats, nuts, and olive oil. Limit salt. -recommend moderate walking, 3-5 times/week for 30-50 minutes each session. Aim for at least 150 minutes.week. Goal should be pace of 3 miles/hours, or walking 1.5 miles in 30 minutes -recommend avoidance of tobacco products. Avoid excess alcohol.  Plan for follow up: 4 months or sooner as needed.  [name], MD, PhD, [facility]   Medication Adjustments/Labs and Tests Ordered: Current medicines are reviewed at length with the patient today.  Concerns regarding medicines are outlined above.   No orders of the defined types were placed in this encounter.  No orders of the defined types were placed in this encounter.  Patient Instructions  Please have your PCP office contact me if they want to change amiodarone, amlodipine, apixaban, furosemide as needed, or rosuvastatin.     I,Mathew Stumpf,acting as a Education administrator for [employer], MD.,have documented all relevant documentation on the behalf of [name], MD,as directed by  [name], MD while in the presence of [name], MD.  ***  Signed, [name], MD PhD 08/03/2021  [place]

## 2021-08-03 NOTE — Patient Instructions (Addendum)
Medication Instructions:   Please have your PCP office contact me if they want to change amiodarone, amlodipine, apixaban, furosemide as needed, or rosuvastatin. Continue the 5 mg amlodipine but contact me if you have any issues.  *If you need a refill on your cardiac medications before your next appointment, please call your pharmacy*   Lab Work: None today  If you have labs (blood work) drawn today and your tests are completely normal, you will receive your results only by: MyChart Message (if you have MyChart) OR A paper copy in the mail If you have any lab test that is abnormal or we need to change your treatment, we will call you to review the results.   Testing/Procedures: None today  Follow-Up: At [facility], you and your health needs are our priority.  As part of our continuing mission to provide you with exceptional heart care, we have created designated Provider Care Teams.  These Care Teams include your primary Cardiologist (physician) and Advanced Practice Providers (APPs -  Physician Assistants and Nurse Practitioners) who all work together to provide you with the care you need, when you need it.  We recommend signing up for the patient portal called "MyChart".  Sign up information is provided on this After Visit Summary.  MyChart is used to connect with patients for Virtual Visits (Telemedicine).  Patients are able to view lab/test results, encounter notes, upcoming appointments, etc.  Non-urgent messages can be sent to your provider as well.   To learn more about what you can do with MyChart, go to [email or web address].    Your next appointment:   4 month(s)  The format for your next appointment:   In Person  Provider:   Jodelle Red, MD    Other Instructions Call me if things change

## 2021-08-06 ENCOUNTER — Telehealth (HOSPITAL_BASED_OUTPATIENT_CLINIC_OR_DEPARTMENT_OTHER): Payer: Self-pay | Admitting: Cardiology

## 2021-08-06 MED ORDER — AMLODIPINE BESYLATE 5 MG PO TABS: 5.0000 mg | ORAL_TABLET | Freq: Every day | ORAL | 3 refills | Status: DC

## 2021-08-06 NOTE — Telephone Encounter (Signed)
°*  STAT* If patient is at the pharmacy, call can be transferred to refill team.   1. Which medications need to be refilled? (please list name of each medication and dose if known)  amLODipine (NORVASC) 5 MG tablet  2. Which pharmacy/location (including street and city if local pharmacy) is medication to be sent to? [facility] - [place], [place] - [address]  3. Do they need a 30 day or 90 day supply? 90 with refills  Patient wanted to make sure that the new dosage of the medication was sent to her mail order pharmacy. The patient's dosage was changed from 2.5 mg to 5 mg

## 2021-08-06 NOTE — Telephone Encounter (Signed)
Rx(s) sent to pharmacy electronically.  

## 2021-08-29 ENCOUNTER — Other Ambulatory Visit: Payer: Self-pay | Admitting: General Practice

## 2021-08-29 DIAGNOSIS — I4891 Unspecified atrial fibrillation: Secondary | ICD-10-CM

## 2021-08-29 NOTE — Telephone Encounter (Signed)
Prescription refill request for Eliquis received. ?Indication:Afib ?Last office visit:2/23 ?GDJ:MEQAS labs ?Age: 88 ?Weight:61.6 kg ? ?Prescription refilled ? ?

## 2021-09-11 ENCOUNTER — Encounter (HOSPITAL_BASED_OUTPATIENT_CLINIC_OR_DEPARTMENT_OTHER): Payer: Self-pay | Admitting: Cardiology

## 2021-10-23 IMAGING — CR DG CHEST 2V
2 series · 2 of 2 positions shown · non-contrast
Comparison: None.

CLINICAL DATA: Acute shortness of breath.

EXAM:
CHEST - 2 VIEW

[chest pa]
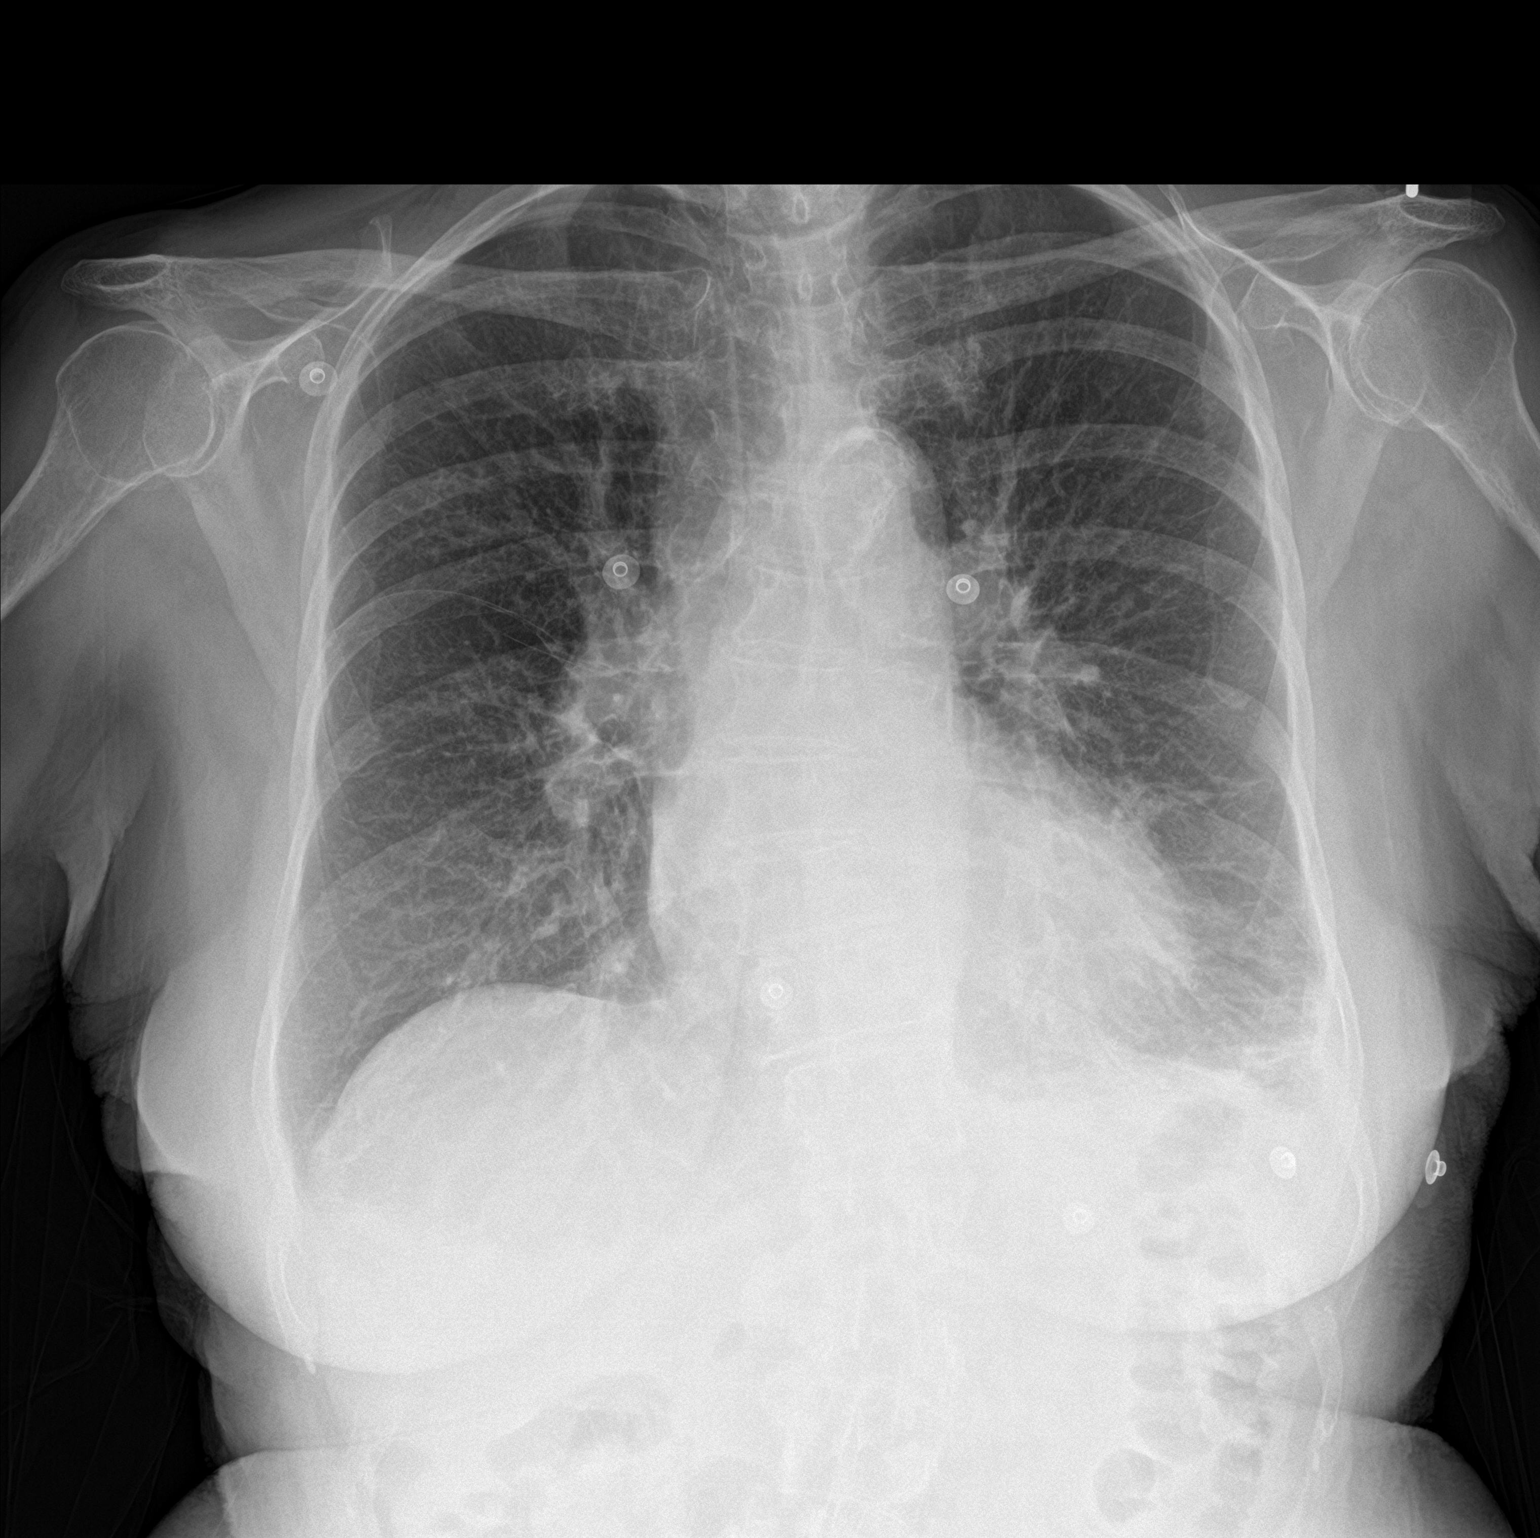

[chest lat]
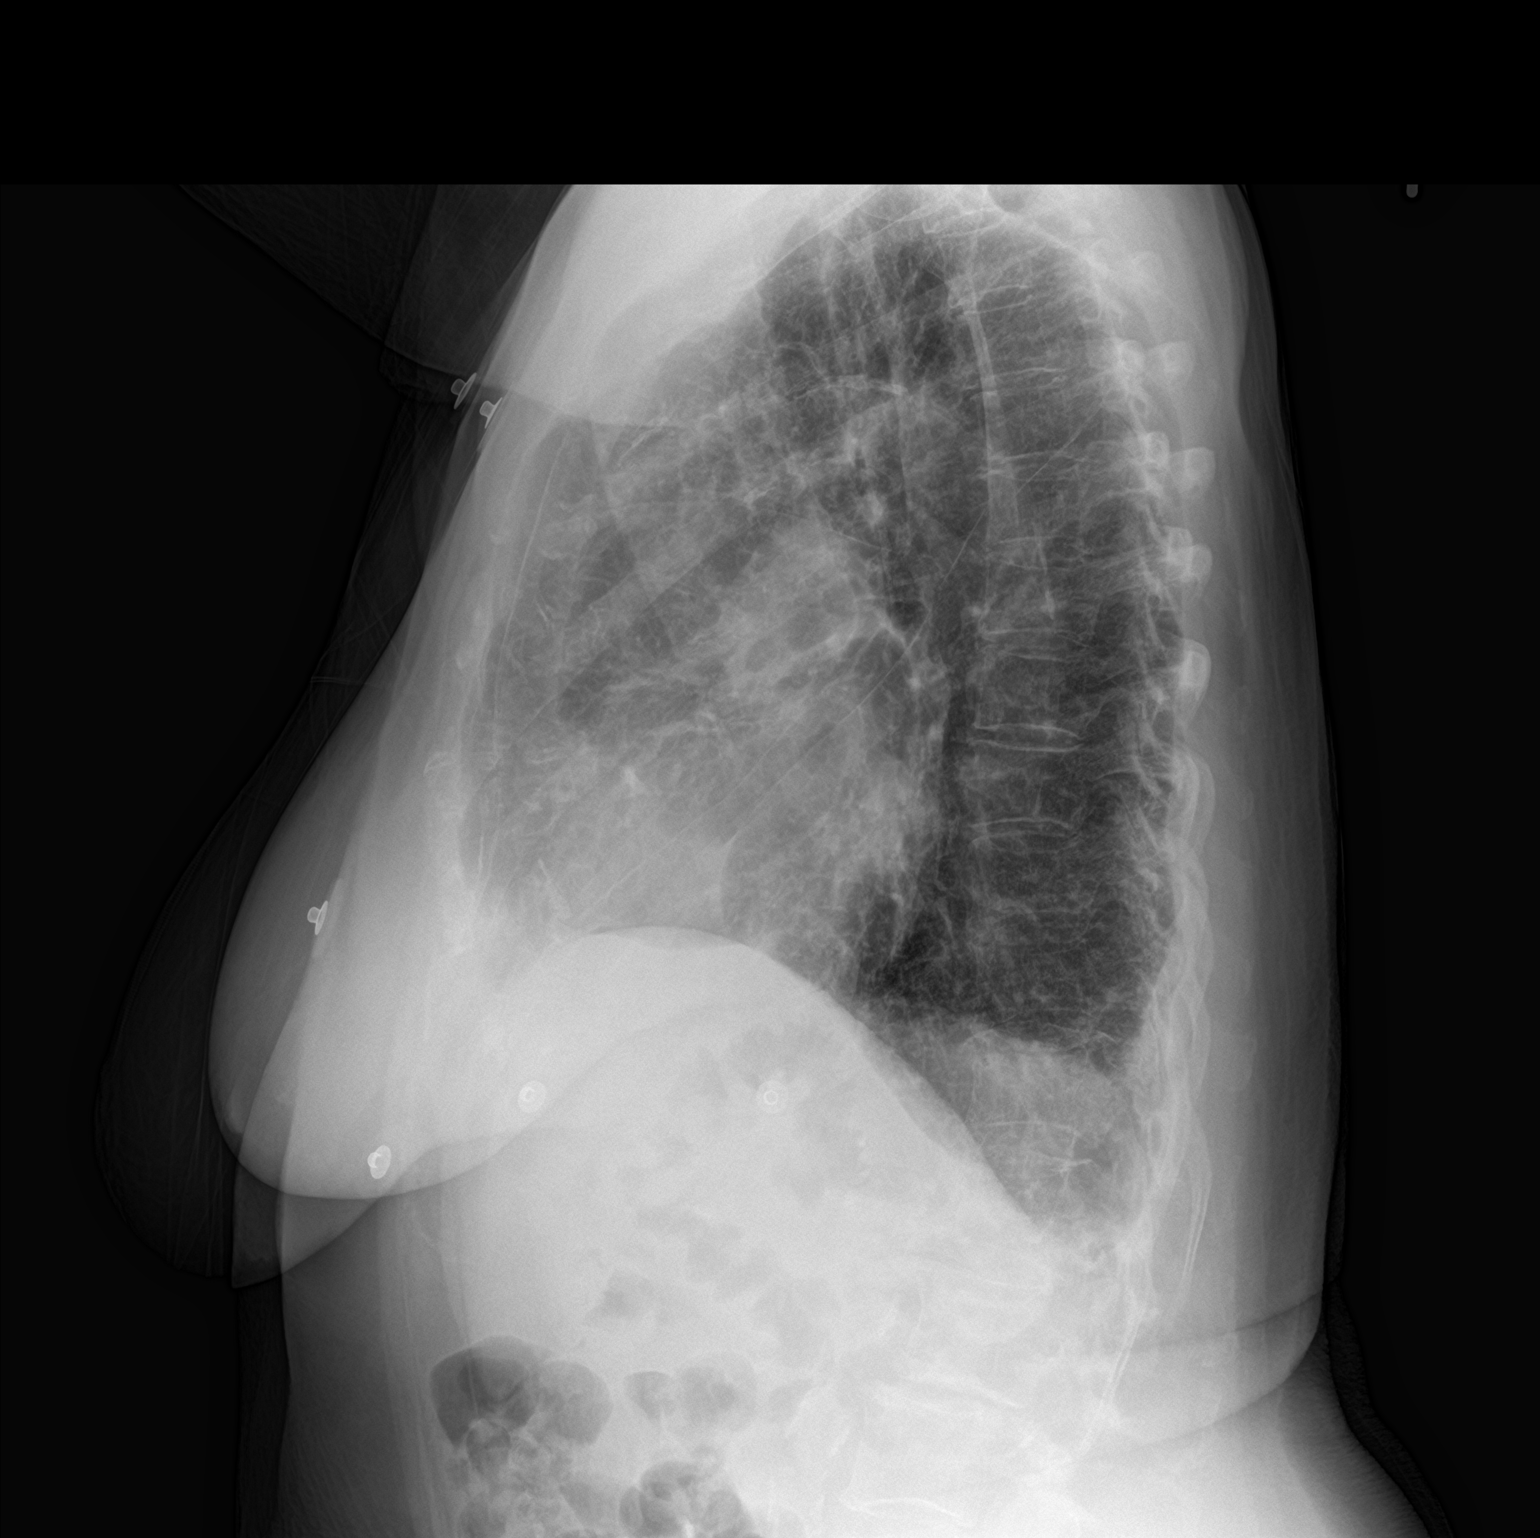

[2 of 2 positions shown; findings below may reference images not displayed]

FINDINGS: The cardiomediastinal silhouette is unremarkable.

Patchy opacities within the lingula and LEFT LOWER lobe may
represent pneumonia.

There may be a trace LEFT pleural effusion present.

Probable COPD/emphysema changes noted.

There is no evidence of pneumothorax.
IMPRESSION: 1. Patchy opacities within the lingula and LEFT LOWER lobe
suspicious for infection/pneumonia. Possible trace LEFT pleural
effusion. Radiographic follow-up to resolution is recommended.
2. Probable COPD/emphysema.

## 2021-10-24 IMAGING — DX DG CHEST 1V PORT
1 series · 1 of 1 positions shown · non-contrast
Comparison: PA and lateral chest 03/05/2020.

CLINICAL DATA: Shortness of breath.

EXAM:
PORTABLE CHEST 1 VIEW

[chest]
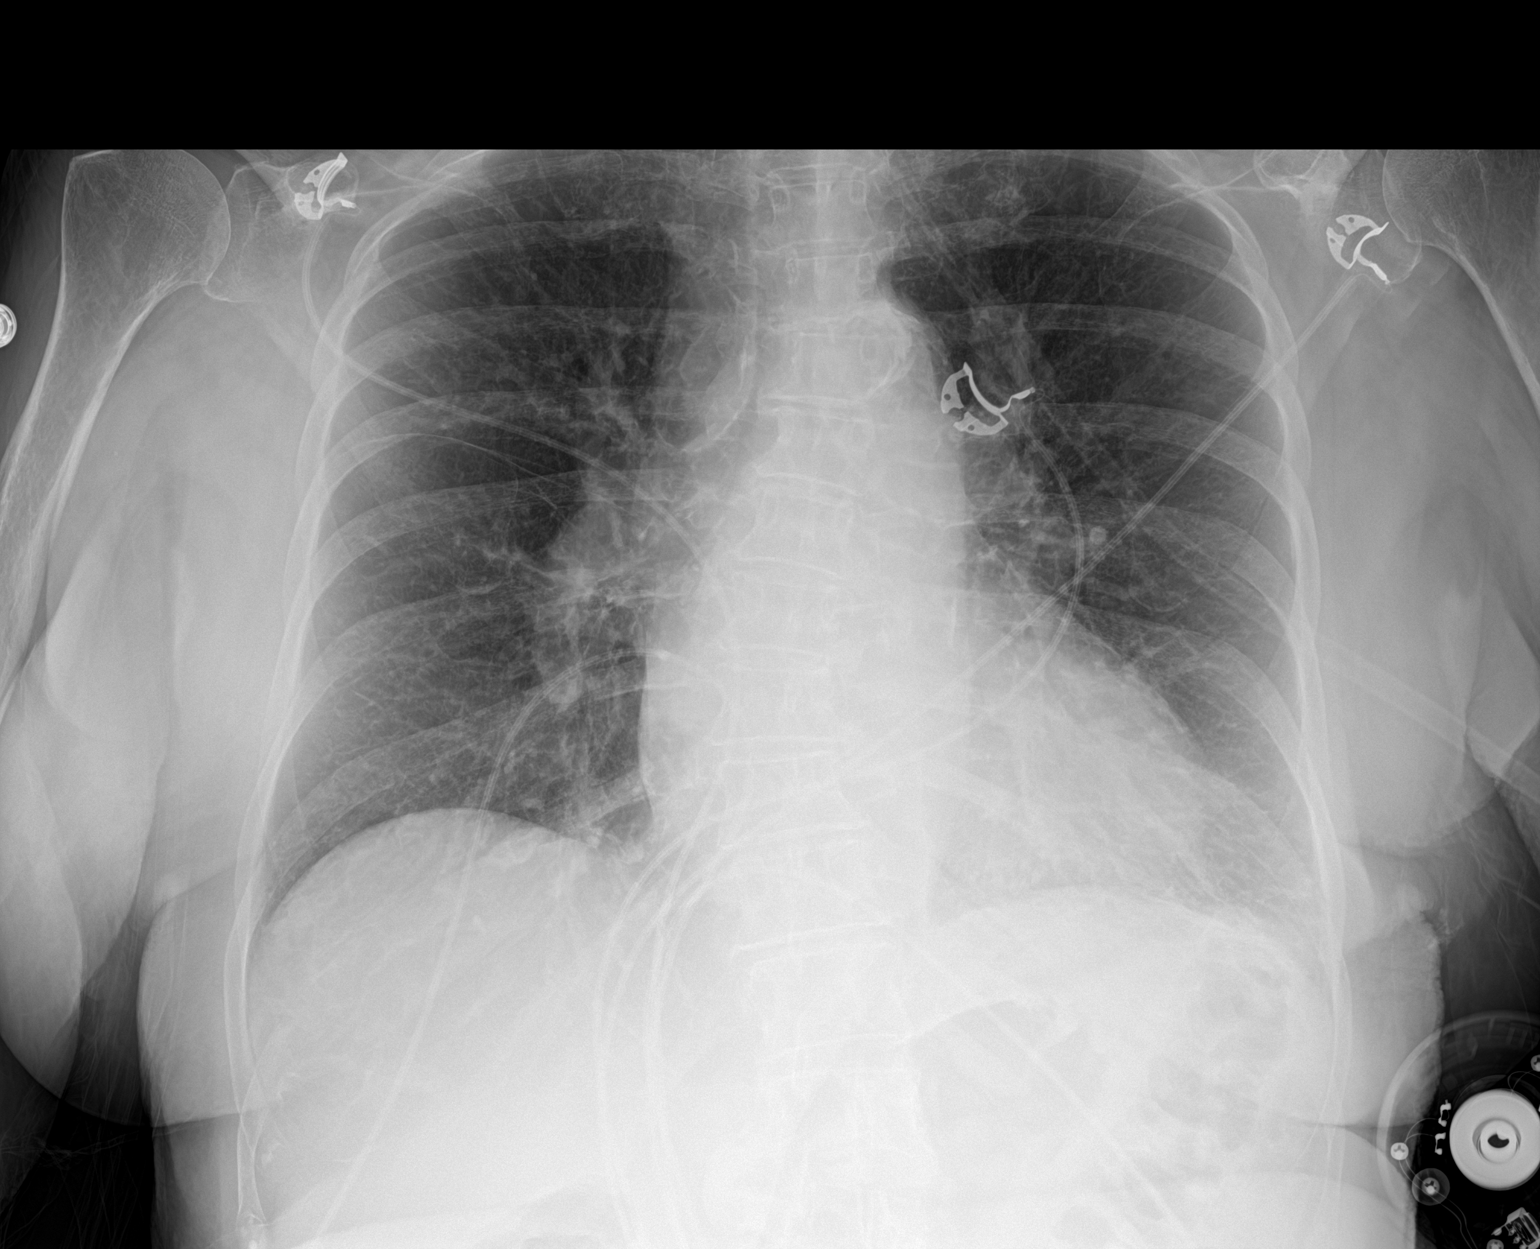

[1 of 1 positions shown; findings below may reference images not displayed]

FINDINGS: Patchy airspace disease in the left lung base and lingula have
markedly improved. The right lung is clear. No pneumothorax or
pleural effusion. Heart size is normal. Aortic atherosclerosis.
Thoracolumbar scoliosis noted.
IMPRESSION: Marked improvement in lingular and left lower lobe airspace disease.

Aortic Atherosclerosis ([id number]).

## 2021-10-25 ENCOUNTER — Other Ambulatory Visit (HOSPITAL_BASED_OUTPATIENT_CLINIC_OR_DEPARTMENT_OTHER): Payer: Self-pay | Admitting: Cardiology

## 2021-10-25 NOTE — Telephone Encounter (Signed)
Rx(s) sent to pharmacy electronically.  

## 2021-10-31 ENCOUNTER — Other Ambulatory Visit: Payer: Self-pay | Admitting: General Practice

## 2021-10-31 MED ORDER — APIXABAN 5 MG PO TABS: ORAL_TABLET | ORAL | 1 refills | Status: DC

## 2021-10-31 NOTE — Telephone Encounter (Signed)
Prescription refill request for Eliquis received. Indication:Afib Last office visit:2/23 Scr: 1.2 Age: 88 Weight:61.6 kg  Prescription refilled

## 2021-11-19 ENCOUNTER — Ambulatory Visit (HOSPITAL_BASED_OUTPATIENT_CLINIC_OR_DEPARTMENT_OTHER): Payer: Medicare HMO | Admitting: Cardiology

## 2021-11-19 ENCOUNTER — Encounter (HOSPITAL_BASED_OUTPATIENT_CLINIC_OR_DEPARTMENT_OTHER): Payer: Self-pay | Admitting: Cardiology

## 2021-11-19 VITALS — BP 182/76 | HR 67 | Ht 62.0 in | Wt 140.2 lb

## 2021-11-19 DIAGNOSIS — I480 Paroxysmal atrial fibrillation: Secondary | ICD-10-CM | POA: Diagnosis not present

## 2021-11-19 DIAGNOSIS — I428 Other cardiomyopathies: Secondary | ICD-10-CM | POA: Diagnosis not present

## 2021-11-19 DIAGNOSIS — I2510 Atherosclerotic heart disease of native coronary artery without angina pectoris: Secondary | ICD-10-CM

## 2021-11-19 DIAGNOSIS — Z7901 Long term (current) use of anticoagulants: Secondary | ICD-10-CM

## 2021-11-19 DIAGNOSIS — R0989 Other specified symptoms and signs involving the circulatory and respiratory systems: Secondary | ICD-10-CM

## 2021-11-19 DIAGNOSIS — D6869 Other thrombophilia: Secondary | ICD-10-CM

## 2021-11-19 DIAGNOSIS — E7800 Pure hypercholesterolemia, unspecified: Secondary | ICD-10-CM

## 2021-11-19 NOTE — Patient Instructions (Signed)

## 2021-11-19 NOTE — Progress Notes (Signed)
Cardiology Office Note:    Date:  11/19/2021   ID:  [name], DOB Aug 21, 1932, MRN [id number]  PCP:  [name], MD  Cardiologist:  [name], MD  Referring MD: [name], MD   CC: follow up  History of Present Illness:    Cheryl Mora is a 88 y.o. female with a hx of type II diabetes, hyperlipidemia, hypertension, hypothyroidism, paroxysmal atrial fibrillation who is seen for follow up.   Cardiac history: I met her 03/02/20 as a new consult for DOE. Very concerning story, set up for outpatient cath. Cath 9/27 with NICM, reduced EF, and new afib RVR. Her medications were adjusted, and she spontaneously converted to sinus rhythm prior to discharge 03/07/20. Since then, she has returned to atrial fibrillation despite amiodarone and reattempt at cardioversion.  Today: She is asking about reducing apixaban dose due to easy bruising. We reviewed that she does not meet criteria yet (meets age, does not meet weight or Cr cutoff--Cr 1.2 in 08/2021). She has not been taking BP as frequently since the move. Prior numbers 113/60-159/81, most average 893T systolic  She has been moving furniture and boxes. She is exhausted from moving, and her has not been sleeping well. The air conditioner is loud and she is unable to sleep when in turns on, but she states that she was having problems sleeping even before the air conditioning.   She experiencing lightheadedness whenever she is bending over and standing upright repeatedly. She had to take a break for at least 3 minutes, and then the lightheadedness resolves.   For activity, she does weight lifting and swimming, as well as walking whenever possible.   The patient denies chest pain, shortness of breath, nocturnal dyspnea, or orthopnea.  There have been no palpitations, lightheadedness or syncope. Complains of bruising easily.    Past Medical History:  Diagnosis Date   Abnormal EKG    suggestive of previous anterior  wall myocardial infarction   Diabetes mellitus ([place])    Hypertension    Hypothyroidism     Past Surgical History:  Procedure Laterality Date   ABDOMINAL HYSTERECTOMY     CARDIOVERSION N/A 04/13/2020   Procedure: CARDIOVERSION;  Surgeon: Elouise Munroe, MD;  Location: [facility] ENDOSCOPY;  Service: Cardiovascular;  Laterality: N/A;   CATARACT EXTRACTION     KNEE SURGERY     LEFT HEART CATH AND CORONARY ANGIOGRAPHY N/A 03/06/2020   Procedure: LEFT HEART CATH AND CORONARY ANGIOGRAPHY;  Surgeon: Martinique, Peter M, MD;  Location: [facility];  Service: Cardiovascular;  Laterality: N/A;    Current Medications: Current Outpatient Medications on File Prior to Visit  Medication Sig   amiodarone (PACERONE) 200 MG tablet TAKE 1 TABLET EVERY DAY   amLODipine (NORVASC) 5 MG tablet Take 1 tablet (5 mg total) by mouth daily.   apixaban (ELIQUIS) 5 MG TABS tablet TAKE 1 TABLET (5MG TOTAL) TWICE DAILY.   ascorbic acid (VITAMIN C) 250 MG CHEW Chew 250 mg by mouth 2 (two) times daily.   calcium carbonate (TUMS - DOSED IN MG ELEMENTAL CALCIUM) 500 MG chewable tablet Chew 1-2 tablets by mouth daily as needed for indigestion or heartburn.   Calcium-Magnesium-Zinc 333-133-5 MG TABS Take 1 tablet by mouth daily.   cetirizine (ZYRTEC) 10 MG tablet Take 10 mg by mouth daily as needed for allergies.   diphenhydrAMINE (BENADRYL) 25 MG tablet Take 25 mg by mouth every 6 (six) hours as needed for allergies or sleep.  diphenhydramine-acetaminophen (TYLENOL PM) 25-500 MG TABS tablet Take 1 tablet by mouth at bedtime as needed (sleep).   dorzolamide-timolol (COSOPT) 22.3-6.8 MG/ML ophthalmic solution Place 1 drop into the left eye 2 (two) times daily.   fluticasone (FLONASE) 50 MCG/ACT nasal spray Place 1 spray into the nose 2 (two) times daily as needed for allergies.    levothyroxine (SYNTHROID) 75 MCG tablet Take 75 mcg by mouth daily before breakfast.   melatonin 5 MG TABS Take 5 mg by mouth at bedtime as needed  (sleep).   Menthol-Methyl Salicylate (SALONPAS PAIN RELIEF PATCH EX) Apply 1 application topically daily as needed (pain).   nitroGLYCERIN (NITROSTAT) 0.4 MG SL tablet Place 1 tablet (0.4 mg total) under the tongue every 5 (five) minutes as needed for chest pain.   Polyethyl Glycol-Propyl Glycol (SYSTANE OP) Place 1 drop into both eyes daily as needed (dryness).   rosuvastatin (CRESTOR) 20 MG tablet TAKE 1 TABLET EVERY DAY   vitamin B-12 (CYANOCOBALAMIN) 100 MCG tablet Take 100 mcg by mouth daily.   No current facility-administered medications on file prior to visit.     Allergies:   Amoxicillin-pot clavulanate, Brimonidine, Lisinopril, Brimonidine tartrate-timolol, and Zolpidem   Social History   Tobacco Use   Smoking status: Former   Smokeless tobacco: Never  Scientific laboratory technician Use: Never used  Substance Use Topics   Alcohol use: Yes    Comment: several a day   Drug use: Never    Family History: family history includes Cirrhosis in her mother; Heart attack in an other family member; Hypertension in her mother; Stroke in her mother.  ROS:   Please see the history of present illness.   Additional pertinent ROS otherwise unremarkable.   EKGs/Labs/Other Studies Reviewed:    The following studies were reviewed today:  Echo 09/20/2020:  IMPRESSIONS    1. Left ventricular ejection fraction, by estimation, is 55 to 60%. The  left ventricle has normal function. The left ventricle has no regional  wall motion abnormalities. There is mild left ventricular hypertrophy.  Left ventricular diastolic parameters  are indeterminate.   2. Right ventricular systolic function is normal. The right ventricular  size is normal. There is normal pulmonary artery systolic pressure. The  estimated right ventricular systolic pressure is 82.4 mmHg.   3. Left atrial size was moderately dilated.   4. Right atrial size was mildly dilated.   5. The mitral valve is normal in structure. Moderate  mitral valve  regurgitation. No evidence of mitral stenosis.   6. The aortic valve is tricuspid. Aortic valve regurgitation is not  visualized. Mild aortic valve sclerosis is present, with no evidence of  aortic valve stenosis.   7. The inferior vena cava is normal in size with greater than 50%  respiratory variability, suggesting right atrial pressure of 3 mmHg.   8. The patient was in atrial fibrillation.   LHC 03/06/20 Prox RCA lesion is 30% stenosed. There is severe left ventricular systolic dysfunction. LV end diastolic pressure is normal. The left ventricular ejection fraction is 25-35% by visual estimate.   1. Mild nonobstructive CAD 2. Severe LV dysfunction with pattern consistent with Takotsubo's cardiomyopathy. EF 30% 3. Normal LVEDP 4. New onset Atrial fibrillation compared to yesterday.   Plan: admit to telemetry. Will initiate IV heparin. May consider transition to NOAC tomorrow. Hold amlodipine due to hypotension. Switch atenolol to metoprolol. Check Echo.   Echo 03/06/20  1. Left ventricular ejection fraction, by estimation, is 25 to 30%.  The  left ventricle has severely decreased function. The left ventricle  demonstrates global hypokinesis. Left ventricular diastolic function could  not be evaluated.   2. Right ventricular systolic function is normal. The right ventricular  size is normal. There is normal pulmonary artery systolic pressure. The  estimated right ventricular systolic pressure is 45.3 mmHg.   3. Left atrial size was moderately dilated.   4. Right atrial size was mildly dilated.   5. There is no evidence of cardiac tamponade.   6. The mitral valve is normal in structure. Mild mitral valve  regurgitation. No evidence of mitral stenosis. Moderate mitral annular  calcification.   7. The aortic valve is normal in structure. Aortic valve regurgitation is  trivial. Mild aortic valve sclerosis is present, with no evidence of  aortic valve stenosis.   8. The  inferior vena cava is normal in size with greater than 50%  respiratory variability, suggesting right atrial pressure of 3 mmHg.  EKG:  EKG is personally reviewed.  11/19/2021 not ordered today 08/03/2021: SR with first degree AV block at 62 bpm 04/24/2021: NSR at 66 bpm 08/23/2020: atrial fibrillation at 90 bpm  Recent Labs: No results found for requested labs within last 365 days.   Recent Lipid Panel    Component Value Date/Time   CHOL 121 03/07/2020 1150   TRIG 111 03/07/2020 1150   HDL 48 03/07/2020 1150   CHOLHDL 2.5 03/07/2020 1150   VLDL 22 03/07/2020 1150   LDLCALC 51 03/07/2020 1150    Physical Exam:    VS:  BP (!) 182/76 (BP Location: Left Arm, Patient Position: Sitting, Cuff Size: Normal)   Pulse 67   Ht _0  (1.575 m)   Wt 140 lb 3.2 oz (63.6 kg)   SpO2 99%   BMI 25.64 kg/m     Wt Readings from Last 3 Encounters:  11/19/21 140 lb 3.2 oz (63.6 kg)  08/03/21 135 lb 12.8 oz (61.6 kg)  06/01/21 135 lb 14.4 oz (61.6 kg)    GEN: Well nourished, well developed in no acute distress HEENT: Normal, moist mucous membranes NECK: No JVD CARDIAC: regular rhythm, normal S1 and S2, no rubs or gallops. No murmur. VASCULAR: Radial and DP pulses 2+ bilaterally. No carotid bruits RESPIRATORY:  Clear to auscultation without rales, wheezing or rhonchi  ABDOMEN: Soft, non-tender, non-distended MUSCULOSKELETAL:  Ambulates independently SKIN: Warm and dry, trivial bilateral LE edema at the medial aspect of ankle/leg. Diffuse scattered ecchymoses NEUROLOGIC:  Alert and oriented x 3. No focal neuro deficits noted. PSYCHIATRIC:  Normal affect    ASSESSMENT:    1. Labile hypertension   2. Paroxysmal atrial fibrillation (HCC)   3. NICM (nonischemic cardiomyopathy) ([place])   4. Nonocclusive coronary atherosclerosis of native coronary artery   5. Pure hypercholesterolemia   6. Secondary hypercoagulable state ([place])   7. Long term current use of anticoagulant    PLAN:    Labile  hypertension/hypotension Asymptomatic elevated blood pressures today -we reviewed her home logs, and we have validated her home cuff before. She has some lability (both high and low), but her average is at goal. Difficult situation as medications have needed to be cut back in the past due to hypotension. Would use home readings and not office numbers to adjust medications. -tolerating amlodipine 5 mg daily -counseled on red flag warning signs that need immediate medical attention  Atrial fibrillation, paroxysmal -in sinus rhythm, continue amiodarone -CHA2DS2/VAS Stroke Risk Points=  6. -continue apixaban, we reviewed that  she does not currently qualify for reduced dose  Nonischemic cardiomyopathy, with recovered EF -tried jardiance, felt terrible. Refuses to reattempt this or similar medications -did not tolerate losartan or metoprolol well previously. Labile BP, as above  Nonobstructive CAD Hypercholesterolemia -goal LDL <70 -continue rosuvastatin  Type II diabetes: -on metformin -did not tolerate Jardiance, made her feel terrible, refuses to trial similar  Cardiac risk counseling and prevention recommendations: -recommend heart healthy/Mediterranean diet, with whole grains, fruits, vegetable, fish, lean meats, nuts, and olive oil. Limit salt. -recommend moderate walking, 3-5 times/week for 30-50 minutes each session. Aim for at least 150 minutes.week. Goal should be pace of 3 miles/hours, or walking 1.5 miles in 30 minutes -recommend avoidance of tobacco products. Avoid excess alcohol.  Plan for follow up: 6 months or sooner as needed.  [name], MD, PhD, [facility]   Medication Adjustments/Labs and Tests Ordered: Current medicines are reviewed at length with the patient today.  Concerns regarding medicines are outlined above.   No orders of the defined types were placed in this encounter.  No orders of the defined types were placed in this  encounter.  Patient Instructions   Follow-Up: At [facility], you and your health needs are our priority.  As part of our continuing mission to provide you with exceptional heart care, we have created designated Provider Care Teams.  These Care Teams include your primary Cardiologist (physician) and Advanced Practice Providers (APPs -  Physician Assistants and Nurse Practitioners) who all work together to provide you with the care you need, when you need it.  We recommend signing up for the patient portal called "MyChart".  Sign up information is provided on this After Visit Summary.  MyChart is used to connect with patients for Virtual Visits (Telemedicine).  Patients are able to view lab/test results, encounter notes, upcoming appointments, etc.  Non-urgent messages can be sent to your provider as well.   To learn more about what you can do with MyChart, go to [email or web address].    Your next appointment:   6 month(s)  The format for your next appointment:   In Person  Provider:   [name], MD      Important Information About Sugar          I,Tinashe Williams,acting as a scribe for [name], MD.,have documented all relevant documentation on the behalf of [name], MD,as directed by  [name], MD while in the presence of [name], MD.   I, [name], MD, have reviewed all documentation for this visit. The documentation on 11/19/21 for the exam, diagnosis, procedures, and orders are all accurate and complete.

## 2022-02-16 ENCOUNTER — Encounter (HOSPITAL_COMMUNITY): Payer: Self-pay | Admitting: Pulmonary Disease

## 2022-02-16 ENCOUNTER — Emergency Department (HOSPITAL_COMMUNITY): Payer: Medicare HMO

## 2022-02-16 ENCOUNTER — Inpatient Hospital Stay (HOSPITAL_COMMUNITY)
Admission: EM | Admit: 2022-02-16 | Discharge: 2022-02-25 | DRG: 177 | Disposition: A | Discharge status: Skilled Nursing Facility | Payer: Medicare HMO | Attending: Family Medicine | Admitting: Family Medicine

## 2022-02-16 ENCOUNTER — Inpatient Hospital Stay (HOSPITAL_COMMUNITY): Payer: Medicare HMO

## 2022-02-16 ENCOUNTER — Other Ambulatory Visit: Payer: Self-pay

## 2022-02-16 DIAGNOSIS — E119 Type 2 diabetes mellitus without complications: Secondary | ICD-10-CM | POA: Diagnosis not present

## 2022-02-16 DIAGNOSIS — Z8249 Family history of ischemic heart disease and other diseases of the circulatory system: Secondary | ICD-10-CM

## 2022-02-16 DIAGNOSIS — I4891 Unspecified atrial fibrillation: Secondary | ICD-10-CM | POA: Diagnosis present

## 2022-02-16 DIAGNOSIS — E785 Hyperlipidemia, unspecified: Secondary | ICD-10-CM | POA: Diagnosis present

## 2022-02-16 DIAGNOSIS — I10 Essential (primary) hypertension: Secondary | ICD-10-CM | POA: Diagnosis present

## 2022-02-16 DIAGNOSIS — I4819 Other persistent atrial fibrillation: Secondary | ICD-10-CM | POA: Diagnosis present

## 2022-02-16 DIAGNOSIS — N1831 Chronic kidney disease, stage 3a: Secondary | ICD-10-CM | POA: Diagnosis present

## 2022-02-16 DIAGNOSIS — Z87891 Personal history of nicotine dependence: Secondary | ICD-10-CM | POA: Diagnosis not present

## 2022-02-16 DIAGNOSIS — I480 Paroxysmal atrial fibrillation: Secondary | ICD-10-CM | POA: Diagnosis not present

## 2022-02-16 DIAGNOSIS — R55 Syncope and collapse: Secondary | ICD-10-CM | POA: Diagnosis present

## 2022-02-16 DIAGNOSIS — Z823 Family history of stroke: Secondary | ICD-10-CM

## 2022-02-16 DIAGNOSIS — R001 Bradycardia, unspecified: Secondary | ICD-10-CM | POA: Diagnosis present

## 2022-02-16 DIAGNOSIS — E1122 Type 2 diabetes mellitus with diabetic chronic kidney disease: Secondary | ICD-10-CM | POA: Diagnosis present

## 2022-02-16 DIAGNOSIS — I159 Secondary hypertension, unspecified: Secondary | ICD-10-CM | POA: Diagnosis not present

## 2022-02-16 DIAGNOSIS — Z7901 Long term (current) use of anticoagulants: Secondary | ICD-10-CM

## 2022-02-16 DIAGNOSIS — Z20822 Contact with and (suspected) exposure to covid-19: Secondary | ICD-10-CM | POA: Diagnosis present

## 2022-02-16 DIAGNOSIS — Z90710 Acquired absence of both cervix and uterus: Secondary | ICD-10-CM

## 2022-02-16 DIAGNOSIS — J690 Pneumonitis due to inhalation of food and vomit: Principal | ICD-10-CM | POA: Diagnosis present

## 2022-02-16 DIAGNOSIS — E039 Hypothyroidism, unspecified: Secondary | ICD-10-CM | POA: Diagnosis present

## 2022-02-16 DIAGNOSIS — J9601 Acute respiratory failure with hypoxia: Secondary | ICD-10-CM | POA: Diagnosis present

## 2022-02-16 DIAGNOSIS — N179 Acute kidney failure, unspecified: Secondary | ICD-10-CM | POA: Diagnosis present

## 2022-02-16 HISTORY — DX: Unspecified atrial fibrillation: I48.91

## 2022-02-16 LAB — COMPREHENSIVE METABOLIC PANEL
ALT: 44 U/L (ref 0–44)
AST: 43 U/L — ABNORMAL HIGH (ref 15–41)
Albumin: 3.7 g/dL (ref 3.5–5.0)
Alkaline Phosphatase: 72 U/L (ref 38–126)
Anion gap: 13 (ref 5–15)
BUN: 27 mg/dL — ABNORMAL HIGH (ref 8–23)
CO2: 21 mmol/L — ABNORMAL LOW (ref 22–32)
Calcium: 8.6 mg/dL — ABNORMAL LOW (ref 8.9–10.3)
Chloride: 106 mmol/L (ref 98–111)
Creatinine, Ser: 1.33 mg/dL — ABNORMAL HIGH (ref 0.44–1.00)
GFR, Estimated: 39 mL/min — ABNORMAL LOW (ref >60)
Glucose, Bld: 176 mg/dL — ABNORMAL HIGH (ref 70–99)
Potassium: 4 mmol/L (ref 3.5–5.1)
Sodium: 140 mmol/L (ref 135–145)
Total Bilirubin: 0.9 mg/dL (ref 0.3–1.2)
Total Protein: 6.7 g/dL (ref 6.5–8.1)

## 2022-02-16 LAB — CBC WITH DIFFERENTIAL/PLATELET
Abs Immature Granulocytes: 0.02 10*3/uL (ref 0.00–0.07)
Basophils Absolute: 0 10*3/uL (ref 0.0–0.1)
Basophils Relative: 0 %
Eosinophils Absolute: 0 10*3/uL (ref 0.0–0.5)
Eosinophils Relative: 0 %
HCT: 40.4 % (ref 36.0–46.0)
Hemoglobin: 12.8 g/dL (ref 12.0–15.0)
Immature Granulocytes: 0 %
Lymphocytes Relative: 4 %
Lymphs Abs: 0.4 10*3/uL — ABNORMAL LOW (ref 0.7–4.0)
MCH: 30.2 pg (ref 26.0–34.0)
MCHC: 31.7 g/dL (ref 30.0–36.0)
MCV: 95.3 fL (ref 80.0–100.0)
Monocytes Absolute: 0.3 10*3/uL (ref 0.1–1.0)
Monocytes Relative: 3 %
Neutro Abs: 9.2 10*3/uL — ABNORMAL HIGH (ref 1.7–7.7)
Neutrophils Relative %: 93 %
Platelets: 194 10*3/uL (ref 150–400)
RBC: 4.24 MIL/uL (ref 3.87–5.11)
RDW: 15.6 % — ABNORMAL HIGH (ref 11.5–15.5)
WBC: 10 10*3/uL (ref 4.0–10.5)
nRBC: 0 % (ref 0.0–0.2)

## 2022-02-16 LAB — LIPASE, BLOOD: Lipase: 37 U/L (ref 11–51)

## 2022-02-16 LAB — TROPONIN I (HIGH SENSITIVITY)
Troponin I (High Sensitivity): 10 ng/L (ref <18)
Troponin I (High Sensitivity): 12 ng/L (ref <18)

## 2022-02-16 LAB — LACTIC ACID, PLASMA: Lactic Acid, Venous: 1.2 mmol/L (ref 0.5–1.9)

## 2022-02-16 LAB — GLUCOSE, CAPILLARY
Glucose-Capillary: 132 mg/dL — ABNORMAL HIGH (ref 70–99)
Glucose-Capillary: 145 mg/dL — ABNORMAL HIGH (ref 70–99)

## 2022-02-16 LAB — HEMOGLOBIN A1C
Hgb A1c MFr Bld: 7.2 % — ABNORMAL HIGH (ref 4.8–5.6)
Mean Plasma Glucose: 159.94 mg/dL

## 2022-02-16 LAB — SARS CORONAVIRUS 2 BY RT PCR: SARS Coronavirus 2 by RT PCR: NEGATIVE

## 2022-02-16 LAB — MRSA NEXT GEN BY PCR, NASAL: MRSA by PCR Next Gen: NOT DETECTED

## 2022-02-16 LAB — BRAIN NATRIURETIC PEPTIDE: B Natriuretic Peptide: 242.4 pg/mL — ABNORMAL HIGH (ref 0.0–100.0)

## 2022-02-16 MED ORDER — MELATONIN 5 MG PO TABS: 5.0000 mg | ORAL_TABLET | Freq: Every evening | ORAL | Status: DC | PRN

## 2022-02-16 MED ORDER — CALCIUM-MAGNESIUM-ZINC 333-133-5 MG PO TABS: 1.0000 | ORAL_TABLET | Freq: Every day | ORAL | Status: DC

## 2022-02-16 MED ORDER — FLUTICASONE PROPIONATE 50 MCG/ACT NA SUSP: 1.0000 | Freq: Two times a day (BID) | NASAL | Status: DC | PRN

## 2022-02-16 MED ORDER — POLYETHYLENE GLYCOL 3350 17 G PO PACK
17.0000 g | PACK | Freq: Every day | ORAL | Status: DC | PRN
  Administered 2022-02-20: 17 g via ORAL
  Filled 2022-02-16: qty 1

## 2022-02-16 MED ORDER — DIPHENHYDRAMINE HCL 25 MG PO CAPS: 25.0000 mg | ORAL_CAPSULE | Freq: Four times a day (QID) | ORAL | Status: DC | PRN

## 2022-02-16 MED ORDER — AMLODIPINE BESYLATE 5 MG PO TABS
5.0000 mg | ORAL_TABLET | Freq: Every day | ORAL | Status: DC
  Administered 2022-02-17 – 2022-02-25 (×9): 5 mg via ORAL
  Filled 2022-02-16 (×9): qty 1

## 2022-02-16 MED ORDER — POLYVINYL ALCOHOL 1.4 % OP SOLN: 1.0000 [drp] | OPHTHALMIC | Status: DC | PRN

## 2022-02-16 MED ORDER — IOHEXOL 350 MG/ML SOLN
80.0000 mL | Freq: Once | INTRAVENOUS | Status: AC | PRN
  Administered 2022-02-16: 80 mL via INTRAVENOUS

## 2022-02-16 MED ORDER — SODIUM CHLORIDE 0.9 % IV BOLUS
500.0000 mL | Freq: Once | INTRAVENOUS | Status: AC
  Administered 2022-02-16: 500 mL via INTRAVENOUS

## 2022-02-16 MED ORDER — ROSUVASTATIN CALCIUM 20 MG PO TABS
20.0000 mg | ORAL_TABLET | Freq: Every day | ORAL | Status: DC
  Administered 2022-02-17 – 2022-02-24 (×8): 20 mg via ORAL
  Filled 2022-02-16 (×8): qty 1

## 2022-02-16 MED ORDER — AMIODARONE HCL 200 MG PO TABS
200.0000 mg | ORAL_TABLET | Freq: Every day | ORAL | Status: DC
  Administered 2022-02-17 – 2022-02-25 (×9): 200 mg via ORAL
  Filled 2022-02-16 (×9): qty 1

## 2022-02-16 MED ORDER — SODIUM CHLORIDE 0.9 % IV SOLN
2.0000 g | INTRAVENOUS | Status: AC
  Administered 2022-02-16 – 2022-02-22 (×7): 2 g via INTRAVENOUS
  Filled 2022-02-16 (×7): qty 20

## 2022-02-16 MED ORDER — APIXABAN 5 MG PO TABS
5.0000 mg | ORAL_TABLET | Freq: Two times a day (BID) | ORAL | Status: DC
  Administered 2022-02-17 – 2022-02-25 (×17): 5 mg via ORAL
  Filled 2022-02-16 (×17): qty 1

## 2022-02-16 MED ORDER — INSULIN ASPART 100 UNIT/ML IJ SOLN
0.0000 [IU] | INTRAMUSCULAR | Status: DC
  Administered 2022-02-16 – 2022-02-17 (×4): 2 [IU] via SUBCUTANEOUS
  Administered 2022-02-18 (×2): 3 [IU] via SUBCUTANEOUS
  Administered 2022-02-18 – 2022-02-19 (×2): 2 [IU] via SUBCUTANEOUS
  Administered 2022-02-19: 3 [IU] via SUBCUTANEOUS

## 2022-02-16 MED ORDER — DORZOLAMIDE HCL-TIMOLOL MAL 2-0.5 % OP SOLN
1.0000 [drp] | Freq: Two times a day (BID) | OPHTHALMIC | Status: DC
Start: 2022-02-16 — End: 2022-02-25
  Administered 2022-02-16 – 2022-02-24 (×17): 1 [drp] via OPHTHALMIC
  Filled 2022-02-16 (×2): qty 10

## 2022-02-16 MED ORDER — FENTANYL CITRATE PF 50 MCG/ML IJ SOSY
50.0000 ug | PREFILLED_SYRINGE | Freq: Once | INTRAMUSCULAR | Status: AC
  Administered 2022-02-16: 50 ug via INTRAVENOUS
  Filled 2022-02-16: qty 1

## 2022-02-16 MED ORDER — SALONPAS PAIN RELIEF PATCH 3-10 % EX PTCH: MEDICATED_PATCH | Freq: Every day | CUTANEOUS | Status: DC | PRN

## 2022-02-16 MED ORDER — ONDANSETRON HCL 4 MG/2ML IJ SOLN
4.0000 mg | Freq: Once | INTRAMUSCULAR | Status: AC
  Administered 2022-02-16: 4 mg via INTRAVENOUS
  Filled 2022-02-16: qty 2

## 2022-02-16 MED ORDER — NITROGLYCERIN 0.4 MG SL SUBL: 0.4000 mg | SUBLINGUAL_TABLET | SUBLINGUAL | Status: DC | PRN

## 2022-02-16 MED ORDER — VITAMIN C 500 MG PO TABS
250.0000 mg | ORAL_TABLET | Freq: Two times a day (BID) | ORAL | Status: DC
  Administered 2022-02-17 – 2022-02-25 (×17): 250 mg via ORAL
  Filled 2022-02-16 (×6): qty 1
  Filled 2022-02-16: qty 0.5
  Filled 2022-02-16 (×12): qty 1

## 2022-02-16 MED ORDER — LEVOTHYROXINE SODIUM 75 MCG PO TABS
75.0000 ug | ORAL_TABLET | Freq: Every day | ORAL | Status: DC
Start: 2022-02-17 — End: 2022-02-25
  Administered 2022-02-17 – 2022-02-25 (×9): 75 ug via ORAL
  Filled 2022-02-16 (×10): qty 1

## 2022-02-16 MED ORDER — SODIUM CHLORIDE 0.9 % IV SOLN
500.0000 mg | INTRAVENOUS | Status: AC
  Administered 2022-02-17 – 2022-02-22 (×7): 500 mg via INTRAVENOUS
  Filled 2022-02-16 (×7): qty 5

## 2022-02-16 MED ORDER — ONDANSETRON HCL 4 MG/2ML IJ SOLN
4.0000 mg | Freq: Four times a day (QID) | INTRAMUSCULAR | Status: DC | PRN
Start: 2022-02-16 — End: 2022-02-25

## 2022-02-16 MED ORDER — POLYETHYL GLYCOL-PROPYL GLYCOL 0.4-0.3 % OP GEL: Freq: Every day | OPHTHALMIC | Status: DC | PRN

## 2022-02-16 MED ORDER — SODIUM CHLORIDE 0.9 % IV BOLUS
1000.0000 mL | Freq: Once | INTRAVENOUS | Status: AC
  Administered 2022-02-16: 1000 mL via INTRAVENOUS

## 2022-02-16 MED ORDER — DOCUSATE SODIUM 100 MG PO CAPS
100.0000 mg | ORAL_CAPSULE | Freq: Two times a day (BID) | ORAL | Status: DC | PRN
  Administered 2022-02-19 – 2022-02-22 (×3): 100 mg via ORAL
  Filled 2022-02-16 (×3): qty 1

## 2022-02-16 MED ORDER — CALCIUM CITRATE 950 (200 CA) MG PO TABS
200.0000 mg | ORAL_TABLET | Freq: Every day | ORAL | Status: DC
  Administered 2022-02-17 – 2022-02-25 (×9): 200 mg via ORAL
  Filled 2022-02-16 (×9): qty 1

## 2022-02-16 MED ORDER — VITAMIN B-12 100 MCG PO TABS
100.0000 ug | ORAL_TABLET | Freq: Every day | ORAL | Status: DC
Start: 2022-02-17 — End: 2022-02-25
  Administered 2022-02-17 – 2022-02-25 (×8): 100 ug via ORAL
  Filled 2022-02-16 (×9): qty 1

## 2022-02-16 NOTE — Progress Notes (Signed)
Reviewed CXR  Started on antibiotics for CAP- azithromycin, rocephin Not at risk for gram negative infection or mrsa

## 2022-02-16 NOTE — ED Notes (Signed)
Return from CT

## 2022-02-16 NOTE — H&P (Signed)
NAME:  Cheryl Mora, MRN:  [id number], DOB:  02/22/1933, LOS: 0 ADMISSION DATE:  02/16/2022, CONSULTATION DATE: 02/16/2022 REFERRING MD: Dr. Rush Landmark, CHIEF COMPLAINT: Shortness of breath  History of Present Illness:  Elderly lady came in with complaints of nausea vomiting, diarrhea of a days duration Symptoms started about 10 AM, went to the bathroom and had a diarrhea, there was associated vomiting.  Vomited about 4-5 times.  Did have some lightheadedness did develop some abdominal pain with radiation to the back Usually healthy with no respiratory complaints Able to exert himself normally without limitations She is usually very active able to walk over 5000 steps and does so on a regular basis, denies any chest pains or chest discomfort Has felt short of breath since having the vomiting episodes Drinks actively Reformed smoker-no history of lung disease  No contact with anybody with a febrile illness Did not have a cough or respiratory complaint prior to onset of vomiting today  Pertinent  Medical History   Past Medical History:  Diagnosis Date   Abnormal EKG    suggestive of previous anterior wall myocardial infarction   Atrial fibrillation (HCC)    On Eliquis   Diabetes mellitus (HCC)    Hypertension    Hypothyroidism    Significant Hospital Events: Including procedures, antibiotic start and stop dates in addition to other pertinent events   02/16/2022-chest x-ray shows no acute infiltrate  Interim History / Subjective:  atient came in with shortness of breath, abdominal pain,  Objective   Blood pressure (!) 99/44, pulse 81, temperature 98 F (36.7 C), temperature source Oral, resp. rate (!) 30, height 5\' 2"  (1.575 m), weight 61.2 kg, SpO2 (!) 87 %.       No intake or output data in the 24 hours ending 02/16/22 1845 Filed Weights   02/16/22 1422  Weight: 61.2 kg   Examination: General: Elderly lady, does not appear in acute distress, BiPAP in place HENT: Moist oral  mucosa Lungs: Clear breath sounds, decreased air movement Cardiovascular: S1-S2 appreciated Abdomen: Soft, bowel sounds appreciated, no tenderness, no rebound  neuro: Alert and oriented x3, moving all extremities GU:   Chest x-ray reviewed by myself-no acute infiltrate  Echocardiogram 09/20/2020-mild pulmonary hypertension, normal ejection fraction  Lipase-37 BNP of 242 Resolved Hospital Problem list     Assessment & Plan:  Hypoxemic respiratory failure May be related to aspiration however chest x-ray not showing any acute infiltrate Heart rate in the 80s to 90s, was on Eliquis, less likely that she has a PE however with a concern of central abdominal pain that is radiating backwards possibility of a dissection needs to be entertained Will has severe hypoxemia as well a pulmonary embolism needs to be entertained  History of hypertension -We will continue home medications  History of diabetes -SSI  Hypothyroidism -Continue Synthroid  Atrial fibrillation -Continue Eliquis  Acute kidney injury -Will fluids and resuscitate -Patient's degree of hypoxemia is currently unexplained and will need contrast stud   Will follow-up on abdominal and chest CT Continue oxygen supplementation Will fluid resuscitate  Best Practice (right click and "Reselect all SmartList Selections" daily)   Diet/type: Regular consistency (see orders) DVT prophylaxis: DOAC GI prophylaxis: N/A Lines: N/A Foley:  N/A Code Status:  full code Last date of multidisciplinary goals of care discussion [pending]  Labs   CBC: Recent Labs  Lab 02/16/22 1436  WBC 10.0  NEUTROABS 9.2*  HGB 12.8  HCT 40.4  MCV 95.3  PLT 194  Basic Metabolic Panel: Recent Labs  Lab 02/16/22 1436  NA 140  K 4.0  CL 106  CO2 21*  GLUCOSE 176*  BUN 27*  CREATININE 1.33*  CALCIUM 8.6*   We will continue GFR: Estimated Creatinine Clearance: 25.6 mL/min (A) (by C-G formula based on SCr of 1.33 mg/dL  (H)). Recent Labs  Lab 02/16/22 1436 02/16/22 1450  WBC 10.0  --   LATICACIDVEN  --  1.2    Liver Function Tests: Recent Labs  Lab 02/16/22 1436  AST 43*  ALT 44  ALKPHOS 72  BILITOT 0.9  PROT 6.7  ALBUMIN 3.7   Recent Labs  Lab 02/16/22 1436  LIPASE 37   No results for input(s): "AMMONIA" in the last 168 hours.  ABG No results found for: "PHART", "PCO2ART", "PO2ART", "HCO3", "TCO2", "ACIDBASEDEF", "O2SAT"   Coagulation Profile: No results for input(s): "INR", "PROTIME" in the last 168 hours.  Cardiac Enzymes: No results for input(s): "CKTOTAL", "CKMB", "CKMBINDEX", "TROPONINI" in the last 168 hours.  HbA1C: Hgb A1c MFr Bld  Date/Time Value Ref Range Status  03/07/2020 01:59 AM 6.7 (H) 4.8 - 5.6 % Final    Comment:    (NOTE) Pre diabetes:          5.7%-6.4%  Diabetes:              >6.4%  Glycemic control for   <7.0% adults with diabetes     CBG: No results for input(s): "GLUCAP" in the last 168 hours.  Review of Systems:   Shortness of breath, abdominal pain  Past Medical History:  She,  has a past medical history of Abnormal EKG, Atrial fibrillation (HCC), Diabetes mellitus (HCC), Hypertension, and Hypothyroidism.   Surgical History:   Past Surgical History:  Procedure Laterality Date   ABDOMINAL HYSTERECTOMY     CARDIOVERSION N/A 04/13/2020   Procedure: CARDIOVERSION;  Surgeon: [name], MD;  Location: [facility] ENDOSCOPY;  Service: Cardiovascular;  Laterality: N/A;   CATARACT EXTRACTION     KNEE SURGERY     LEFT HEART CATH AND CORONARY ANGIOGRAPHY N/A 03/06/2020   Procedure: LEFT HEART CATH AND CORONARY ANGIOGRAPHY;  Surgeon: [place]az[place], Peter M, MD;  Location: [facility] INVASIVE CV LAB;  Service: Cardiovascular;  Laterality: N/A;     Social History:   reports that she has quit smoking. She has never used smokeless tobacco. She reports current alcohol use. She reports that she does not use drugs.   Family History:  Her family history includes  Cirrhosis in her mother; Heart attack in an other family member; Hypertension in her mother; Stroke in her mother.   Allergies Allergies  Allergen Reactions   Amoxicillin-Pot Clavulanate Diarrhea and Other (See Comments)    Drowsy     Brimonidine Other (See Comments)    Conj follicles and injection    Lisinopril Swelling and Other (See Comments)    Angioedema welling lips, throat closing     Brimonidine Tartrate-Timolol Other (See Comments)    Redness in eye   Zolpidem Swelling    Lips swell     Home Medications  Prior to Admission medications   Medication Sig Start Date End Date Taking? Authorizing Provider  amiodarone (PACERONE) 200 MG tablet TAKE 1 TABLET EVERY DAY 08/02/21   Jodelle Red, MD  amLODipine (NORVASC) 5 MG tablet Take 1 tablet (5 mg total) by mouth daily. 08/06/21   Jodelle Red, MD  apixaban (ELIQUIS) 5 MG TABS tablet TAKE 1 TABLET (5MG  TOTAL) TWICE DAILY. 10/31/21  Ronney Asters, NP  ascorbic acid (VITAMIN C) 250 MG CHEW Chew 250 mg by mouth 2 (two) times daily.    [provider]  calcium carbonate (TUMS - DOSED IN MG ELEMENTAL CALCIUM) 500 MG chewable tablet Chew 1-2 tablets by mouth daily as needed for indigestion or heartburn.    [provider]  Calcium-Magnesium-Zinc [phone] MG TABS Take 1 tablet by mouth daily.    [provider]  cetirizine (ZYRTEC) 10 MG tablet Take 10 mg by mouth daily as needed for allergies.    [provider]  diphenhydrAMINE (BENADRYL) 25 MG tablet Take 25 mg by mouth every 6 (six) hours as needed for allergies or sleep.    [provider]  diphenhydramine-acetaminophen (TYLENOL PM) 25-500 MG TABS tablet Take 1 tablet by mouth at bedtime as needed (sleep).    [provider]  dorzolamide-timolol (COSOPT) 22.3-6.8 MG/ML ophthalmic solution Place 1 drop into the left eye 2 (two) times daily.    [provider]  fluticasone (FLONASE) 50 MCG/ACT nasal  spray Place 1 spray into the nose 2 (two) times daily as needed for allergies.     [provider]  levothyroxine (SYNTHROID) 75 MCG tablet Take 75 mcg by mouth daily before breakfast. 01/24/21   [provider]  melatonin 5 MG TABS Take 5 mg by mouth at bedtime as needed (sleep).    [provider]  Menthol-Methyl Salicylate (SALONPAS PAIN RELIEF PATCH EX) Apply 1 application topically daily as needed (pain).    [provider]  nitroGLYCERIN (NITROSTAT) 0.4 MG SL tablet Place 1 tablet (0.4 mg total) under the tongue every 5 (five) minutes as needed for chest pain. 03/02/20   Jodelle Red, MD  Polyethyl Glycol-Propyl Glycol (SYSTANE OP) Place 1 drop into both eyes daily as needed (dryness).    [provider]  rosuvastatin (CRESTOR) 20 MG tablet TAKE 1 TABLET EVERY DAY 10/25/21   Jodelle Red, MD  vitamin B-12 (CYANOCOBALAMIN) 100 MCG tablet Take 100 mcg by mouth daily.    [provider]    The patient is critically ill with multiple organ systems failure and requires high complexity decision making for assessment and support, frequent evaluation and titration of therapies, application of advanced monitoring technologies and extensive interpretation of multiple databases. Critical Care Time devoted to patient care services described in this note independent of APP/resident time (if applicable)  is 33 minutes.   Virl Diamond MD [facility] Personal pager: See Amion If unanswered, please page CCM On-call: [phone]

## 2022-02-16 NOTE — Progress Notes (Signed)
eLink Physician-Brief Progress Note Patient Name: Cheryl Mora DOB: May 20, 1933 MRN: [id number]   Date of Service  02/16/2022  HPI/Events of Note  89/F with history of Afib, DM, who presents with diarrhea and multiple episodes of vomiting. On evaluation, she was tachypnic to 30, SpO2 was 87% on room air. She was placed on BIPAP and admitted to the ICU.   eICU Interventions  Admit to ICU Currently on BIPAP, appears comfortable.  Hemodynamically stable.  Will titirate FiO2, EPAP to maintain SPO2 >90% Will reassess respiratory status in AM, see if she can be weaned to [facility].  Etiology for acute hypoxemia is unclear. CXR only shows small L effusion.  PE considered, but unlikely as pt is already on anticoagulation.  Will await results of CT chest, abdomen, pelvis. Follow up culture results.  Low threshold to initiate antibiotics Symptomatic management of abdominal complaints.         Jacy Brocker M DELA CRUZ 02/16/2022, 8:29 PM

## 2022-02-16 NOTE — Progress Notes (Signed)
RT transported pt from ED03 to CT4 and back on BIPAP with no complications.

## 2022-02-16 NOTE — ED Triage Notes (Signed)
Pt BIB EMS for N/V/D since this morning. Pt reports weakness and fatigue. Pt had a fall to the floor while ambulating to the bathroom. Pt denies any injury.   EMS Vitals 152/60 CBG 187 RR 18 HR 70 83% RA 95% on 3L [place]

## 2022-02-16 NOTE — Progress Notes (Signed)
RT transported pt from ED03 to 2M04 on BIPAP with no complications.

## 2022-02-16 NOTE — ED Provider Notes (Signed)
[facility] EMERGENCY DEPARTMENT Provider Note   CSN: [id number] Arrival date & time: 02/16/22  1417     History  No chief complaint on file.   Cheryl Mora is a 88 y.o. female.  The history is provided by the patient and medical records. No language interpreter was used.  Emesis Severity:  Severe Duration:  2 days Timing:  Intermittent Quality:  Stomach contents Progression:  Unchanged Chronicity:  New Recent urination:  Normal Associated symptoms: abdominal pain (developed while here), cough, diarrhea and URI   Associated symptoms: no chills, no fever and no headaches        Home Medications Prior to Admission medications   Medication Sig Start Date End Date Taking? Authorizing Provider  amiodarone (PACERONE) 200 MG tablet TAKE 1 TABLET EVERY DAY 08/02/21   Jodelle Red, MD  amLODipine (NORVASC) 5 MG tablet Take 1 tablet (5 mg total) by mouth daily. 08/06/21   Jodelle Red, MD  apixaban (ELIQUIS) 5 MG TABS tablet TAKE 1 TABLET (5MG  TOTAL) TWICE DAILY. 10/31/21   11/02/21, NP  ascorbic acid (VITAMIN C) 250 MG CHEW Chew 250 mg by mouth 2 (two) times daily.    [provider]  calcium carbonate (TUMS - DOSED IN MG ELEMENTAL CALCIUM) 500 MG chewable tablet Chew 1-2 tablets by mouth daily as needed for indigestion or heartburn.    [provider]  Calcium-Magnesium-Zinc [phone] MG TABS Take 1 tablet by mouth daily.    [provider]  cetirizine (ZYRTEC) 10 MG tablet Take 10 mg by mouth daily as needed for allergies.    [provider]  diphenhydrAMINE (BENADRYL) 25 MG tablet Take 25 mg by mouth every 6 (six) hours as needed for allergies or sleep.    [provider]  diphenhydramine-acetaminophen (TYLENOL PM) 25-500 MG TABS tablet Take 1 tablet by mouth at bedtime as needed (sleep).    [provider]  dorzolamide-timolol (COSOPT) 22.3-6.8 MG/ML ophthalmic solution Place 1  drop into the left eye 2 (two) times daily.    [provider]  fluticasone (FLONASE) 50 MCG/ACT nasal spray Place 1 spray into the nose 2 (two) times daily as needed for allergies.     [provider]  levothyroxine (SYNTHROID) 75 MCG tablet Take 75 mcg by mouth daily before breakfast. 01/24/21   [provider]  melatonin 5 MG TABS Take 5 mg by mouth at bedtime as needed (sleep).    [provider]  Menthol-Methyl Salicylate (SALONPAS PAIN RELIEF PATCH EX) Apply 1 application topically daily as needed (pain).    [provider]  nitroGLYCERIN (NITROSTAT) 0.4 MG SL tablet Place 1 tablet (0.4 mg total) under the tongue every 5 (five) minutes as needed for chest pain. 03/02/20   03/04/20, MD  Polyethyl Glycol-Propyl Glycol (SYSTANE OP) Place 1 drop into both eyes daily as needed (dryness).    [provider]  rosuvastatin (CRESTOR) 20 MG tablet TAKE 1 TABLET EVERY DAY 10/25/21   10/27/21, MD  vitamin B-12 (CYANOCOBALAMIN) 100 MCG tablet Take 100 mcg by mouth daily.    [provider]      Allergies    Amoxicillin-pot clavulanate, Brimonidine, Lisinopril, Brimonidine tartrate-timolol, and Zolpidem    Review of Systems   Review of Systems  Constitutional:  Positive for fatigue. Negative for chills, diaphoresis and fever.  HENT:  Positive for congestion.   Respiratory:  Positive for cough, chest tightness and shortness of breath. Negative for wheezing.  Cardiovascular:  Negative for chest pain and palpitations.  Gastrointestinal:  Positive for abdominal pain (developed while here), diarrhea, nausea and vomiting. Negative for constipation.  Genitourinary:  Negative for dysuria and flank pain.  Musculoskeletal:  Positive for back pain. Negative for neck pain and neck stiffness.  Skin:  Negative for rash and wound.  Neurological:  Negative for light-headedness and headaches.  Psychiatric/Behavioral:  Negative  for agitation and confusion.   All other systems reviewed and are negative.   Physical Exam Updated Vital Signs Ht 5\' 2"  (1.575 m)   Wt 61.2 kg   BMI 24.69 kg/m  Physical Exam Vitals and nursing note reviewed.  Constitutional:      General: She is not in acute distress.    Appearance: She is well-developed. She is ill-appearing. She is not toxic-appearing or diaphoretic.  HENT:     Head: Normocephalic and atraumatic.     Mouth/Throat:     Mouth: Mucous membranes are dry.     Pharynx: No oropharyngeal exudate or posterior oropharyngeal erythema.  Eyes:     Conjunctiva/sclera: Conjunctivae normal.     Pupils: Pupils are equal, round, and reactive to light.  Cardiovascular:     Rate and Rhythm: Normal rate and regular rhythm.     Heart sounds: No murmur heard. Pulmonary:     Effort: Pulmonary effort is normal. No respiratory distress.     Breath sounds: Rhonchi present. No wheezing or rales.  Chest:     Chest wall: No tenderness.  Abdominal:     General: Abdomen is flat.     Palpations: Abdomen is soft.     Tenderness: There is no abdominal tenderness. There is no guarding or rebound.  Musculoskeletal:        General: No swelling or tenderness.     Cervical back: Neck supple. No tenderness.     Right lower leg: No edema.     Left lower leg: No edema.  Skin:    General: Skin is warm and dry.     Capillary Refill: Capillary refill takes less than 2 seconds.     Findings: No erythema or rash.  Neurological:     General: No focal deficit present.     Mental Status: She is alert.  Psychiatric:        Mood and Affect: Mood normal.     ED Results / Procedures / Treatments   Labs (all labs ordered are listed, but only abnormal results are displayed) Labs Reviewed  CBC WITH DIFFERENTIAL/PLATELET - Abnormal; Notable for the following components:      Result Value   RDW 15.6 (*)    Neutro Abs 9.2 (*)    Lymphs Abs 0.4 (*)    All other components within normal limits   COMPREHENSIVE METABOLIC PANEL - Abnormal; Notable for the following components:   CO2 21 (*)    Glucose, Bld 176 (*)    BUN 27 (*)    Creatinine, Ser 1.33 (*)    Calcium 8.6 (*)    AST 43 (*)    GFR, Estimated 39 (*)    All other components within normal limits  BRAIN NATRIURETIC PEPTIDE - Abnormal; Notable for the following components:   B Natriuretic Peptide 242.4 (*)    All other components within normal limits  GLUCOSE, CAPILLARY - Abnormal; Notable for the following components:   Glucose-Capillary 132 (*)    All other components within normal limits  SARS CORONAVIRUS 2 BY RT PCR  CULTURE, BLOOD (ROUTINE  X 2)  CULTURE, BLOOD (ROUTINE X 2)  URINE CULTURE  MRSA NEXT GEN BY PCR, NASAL  LACTIC ACID, PLASMA  LIPASE, BLOOD  URINALYSIS, ROUTINE W REFLEX MICROSCOPIC  CBC  BASIC METABOLIC PANEL  MAGNESIUM  PHOSPHORUS  HEMOGLOBIN A1C  TROPONIN I (HIGH SENSITIVITY)  TROPONIN I (HIGH SENSITIVITY)    EKG EKG Interpretation  Date/Time:  Saturday February 16 2022 19:51:59 EDT Ventricular Rate:  76 PR Interval:  211 QRS Duration: 108 QT Interval:  399 QTC Calculation: 449 R Axis:   -28 Text Interpretation: Sinus or ectopic atrial rhythm Borderline left axis deviation Anteroseptal infarct, age indeterminate similar appearance to prior No STEMI Confirmed by Theda Belfast ([id number]) on 02/16/2022 9:28:55 PM  Radiology DG Chest Portable 1 View  Result Date: 02/16/2022 CLINICAL DATA:  Cough and hypoxia. EXAM: PORTABLE CHEST 1 VIEW COMPARISON:  03/06/2020 radiograph FINDINGS: The cardiomediastinal silhouette is unremarkable. Mild peribronchial thickening and mild bibasilar atelectasis/scarring again noted. There is no evidence of focal airspace disease, pulmonary edema, suspicious pulmonary nodule/mass, pleural effusion, or pneumothorax. No acute bony abnormalities are identified. IMPRESSION: No evidence of acute cardiopulmonary disease. Electronically Signed   By: Harmon Pier M.D.   On:  02/16/2022 15:42    Procedures Procedures    CRITICAL CARE Performed by: Canary Brim Lakeitha Basques Total critical care time: 45 minutes Critical care time was exclusive of separately billable procedures and treating other patients. Critical care was necessary to treat or prevent imminent or life-threatening deterioration. Critical care was time spent personally by me on the following activities: development of treatment plan with patient and/or surrogate as well as nursing, discussions with consultants, evaluation of patient's response to treatment, examination of patient, obtaining history from patient or surrogate, ordering and performing treatments and interventions, ordering and review of laboratory studies, ordering and review of radiographic studies, pulse oximetry and re-evaluation of patient's condition.   Medications Ordered in ED Medications  docusate sodium (COLACE) capsule 100 mg (has no administration in time range)  polyethylene glycol (MIRALAX / GLYCOLAX) packet 17 g (has no administration in time range)  amiodarone (PACERONE) tablet 200 mg (has no administration in time range)  amLODipine (NORVASC) tablet 5 mg (has no administration in time range)  apixaban (ELIQUIS) tablet 5 mg (has no administration in time range)  ascorbic acid (VITAMIN C) chewable tablet 250 mg (has no administration in time range)  Calcium-Magnesium-Zinc 333-133-5 MG TABS 1 tablet (has no administration in time range)  diphenhydrAMINE (BENADRYL) tablet 25 mg (has no administration in time range)  dorzolamide-timolol (COSOPT) 22.3-6.8 MG/ML ophthalmic solution 1 drop (has no administration in time range)  fluticasone (FLONASE) 50 MCG/ACT nasal spray 1 spray (has no administration in time range)  levothyroxine (SYNTHROID) tablet 75 mcg (has no administration in time range)  melatonin tablet 5 mg (has no administration in time range)  nitroGLYCERIN (NITROSTAT) SL tablet 0.4 mg (has no administration in time  range)  vitamin B-12 (CYANOCOBALAMIN) tablet 100 mcg (has no administration in time range)  rosuvastatin (CRESTOR) tablet 20 mg (has no administration in time range)  polyethylene glycol 0.4% and propylene glycol 0.3% (SYSTANE) ophthalmic gel (has no administration in time range)  Salonpas Pain Relief Patch 3-10 % PTCH (has no administration in time range)  insulin aspart (novoLOG) injection 0-15 Units (has no administration in time range)  sodium chloride 0.9 % bolus 500 mL (has no administration in time range)  sodium chloride 0.9 % bolus 1,000 mL (0 mLs Intravenous Stopped 02/16/22 1713)  ondansetron (ZOFRAN) injection  4 mg (4 mg Intravenous Given 02/16/22 1455)  fentaNYL (SUBLIMAZE) injection 50 mcg (50 mcg Intravenous Given 02/16/22 1800)    ED Course/ Medical Decision Making/ A&P                           Medical Decision Making Amount and/or Complexity of Data Reviewed Labs: ordered. Radiology: ordered.  Risk Prescription drug management. Decision regarding hospitalization.    Sheanna Dail is a 88 y.o. female with a past medical history significant for hypertension, hyperlipidemia, diabetes, previous heart failure, and atrial fibrillation on Eliquis therapy who presents for nausea, vomiting, diarrhea, chills, fatigue, cough, and hypoxia of 83% with EMS.  According to patient, she has had some URI symptoms for the last 2 days and started having nausea vomiting and diarrhea today.  She reports she is having coughing up some phlegm and feels very tired.  She had several episodes of nonbloody nonbilious emesis and nonbloody diarrhea.  EMS reportedly found her at 83% on room air and placed her on 3 L nasal cannula.  On my initial evaluation her oxygen saturation was still 88% so she was increased to 5 L nasal cannula.  She is denying any headache but reports some neck soreness.  No neurologic complaints otherwise.  Denies any chest pain or abdominal pain.  Denies any sick contacts.  On  exam, patient does have rhonchi in her lungs.  Chest and abdomen are nontender.  Neck has normal range of motion and is nontender.  No focal neurologic deficits.  Pupils are symmetric and reactive, extract movements.  Mucous membranes are dry and she is vomiting into a bag.  Patient is ill-appearing and warm to the touch although her oral temperature is normal.  Clinically I am concerned about pneumonia versus COVID versus other infection.  We will order labs including lactic acid and cultures.  Patient will need admission for this new hypoxia and oxygen requirement.  Patient's breathing continued to deteriorate.  She was placed on BiPAP for persistent oxygen saturations in the 60s and 70s.  This started to improve.  Patient also started complaining of pain in her lower abdomen that radiates straight to her back.  Critical care was called and will come see patient but they agree with a dissection study.  They will admit for further restaurant management.  Patient confirms she is full code and agrees with intubation if needed.  Still unclear etiology of the patient's hypoxia other than possible aspiration given the nausea and vomiting and her reporting that she could have choked on some.  CT female clarify to see if there is pneumonia as the x-ray did not show any.  Patient be admitted by critical care for further management.  Patient taken to ICU after getting the CT scan.  Results did not finalize prior to going upstairs.         Final Clinical Impression(s) / ED Diagnoses Final diagnoses:  Acute respiratory failure with hypoxia (HCC)     Clinical Impression: 1. Acute respiratory failure with hypoxia (HCC)     Disposition: Admit  This note was prepared with assistance of Dragon voice recognition software. Occasional wrong-word or sound-a-like substitutions may have occurred due to the inherent limitations of voice recognition software.     Bascom Biel, Canary Brim, MD 02/16/22  2129

## 2022-02-16 NOTE — ED Notes (Signed)
Off bi-pap trial with 15 lpm partial rebreather.  Oxygen sats decreased to 90%.  Respirations remain smooth and non labored, placed back on bipap.

## 2022-02-16 NOTE — ED Notes (Signed)
Patient transported to CT 

## 2022-02-17 DIAGNOSIS — J9601 Acute respiratory failure with hypoxia: Secondary | ICD-10-CM | POA: Diagnosis not present

## 2022-02-17 LAB — STREP PNEUMONIAE URINARY ANTIGEN: Strep Pneumo Urinary Antigen: NEGATIVE

## 2022-02-17 LAB — BASIC METABOLIC PANEL
Anion gap: 15 (ref 5–15)
BUN: 22 mg/dL (ref 8–23)
CO2: 19 mmol/L — ABNORMAL LOW (ref 22–32)
Calcium: 8.1 mg/dL — ABNORMAL LOW (ref 8.9–10.3)
Chloride: 109 mmol/L (ref 98–111)
Creatinine, Ser: 1.24 mg/dL — ABNORMAL HIGH (ref 0.44–1.00)
GFR, Estimated: 42 mL/min — ABNORMAL LOW (ref >60)
Glucose, Bld: 124 mg/dL — ABNORMAL HIGH (ref 70–99)
Potassium: 3.7 mmol/L (ref 3.5–5.1)
Sodium: 143 mmol/L (ref 135–145)

## 2022-02-17 LAB — CBC
HCT: 35.4 % — ABNORMAL LOW (ref 36.0–46.0)
Hemoglobin: 11.1 g/dL — ABNORMAL LOW (ref 12.0–15.0)
MCH: 30.2 pg (ref 26.0–34.0)
MCHC: 31.4 g/dL (ref 30.0–36.0)
MCV: 96.5 fL (ref 80.0–100.0)
Platelets: 152 10*3/uL (ref 150–400)
RBC: 3.67 MIL/uL — ABNORMAL LOW (ref 3.87–5.11)
RDW: 15.9 % — ABNORMAL HIGH (ref 11.5–15.5)
WBC: 8.9 10*3/uL (ref 4.0–10.5)
nRBC: 0 % (ref 0.0–0.2)

## 2022-02-17 LAB — PHOSPHORUS: Phosphorus: 4.8 mg/dL — ABNORMAL HIGH (ref 2.5–4.6)

## 2022-02-17 LAB — GLUCOSE, CAPILLARY
Glucose-Capillary: 116 mg/dL — ABNORMAL HIGH (ref 70–99)
Glucose-Capillary: 118 mg/dL — ABNORMAL HIGH (ref 70–99)
Glucose-Capillary: 139 mg/dL — ABNORMAL HIGH (ref 70–99)
Glucose-Capillary: 103 mg/dL — ABNORMAL HIGH (ref 70–99)
Glucose-Capillary: 136 mg/dL — ABNORMAL HIGH (ref 70–99)
Glucose-Capillary: 96 mg/dL (ref 70–99)

## 2022-02-17 LAB — MAGNESIUM: Magnesium: 1.7 mg/dL (ref 1.7–2.4)

## 2022-02-17 MED ORDER — ORAL CARE MOUTH RINSE: 15.0000 mL | OROMUCOSAL | Status: DC | PRN

## 2022-02-17 MED ORDER — CHLORHEXIDINE GLUCONATE CLOTH 2 % EX PADS
6.0000 | MEDICATED_PAD | Freq: Every day | CUTANEOUS | Status: DC
  Administered 2022-02-16 – 2022-02-21 (×6): 6 via TOPICAL

## 2022-02-17 MED ORDER — MORPHINE SULFATE (PF) 2 MG/ML IV SOLN
1.0000 mg | INTRAVENOUS | Status: DC | PRN
  Administered 2022-02-17: 1 mg via INTRAVENOUS
  Filled 2022-02-17: qty 1

## 2022-02-17 NOTE — H&P (Signed)
NAME:  Cheryl Mora, MRN:  [id number], DOB:  [birth year], LOS: 1 ADMISSION DATE:  02/16/2022, CONSULTATION DATE: 02/16/2022 REFERRING MD: Dr. Rush Landmark, CHIEF COMPLAINT: Shortness of breath  History of Present Illness:  88 y/o F, [facility] resident, admitted 9/9 with reports of N/V/D.    Symptoms started about 10 AM on 9/9, went to the bathroom and had a diarrhea, there was associated vomiting.  Vomited about 4-5 times.  Did have some lightheadedness, developed lower abdominal pain with radiation to the back.  Lives at ALF, walks 5,000 steps per day. Usually healthy with no respiratory complaints Able to exert herself normally without limitations. Has felt short of breath since having the vomiting episodes. Drinks actively 3 oz of gin per day.  Reformed smoker-no history of lung disease  No contact with anybody with a febrile illness Did not have a cough or respiratory complaint prior to onset of vomiting today.  Pertinent  Medical History   Past Medical History:  Diagnosis Date   Abnormal EKG    suggestive of previous anterior wall myocardial infarction   Atrial fibrillation (HCC)    On Eliquis   Diabetes mellitus (HCC)    Hypertension    Hypothyroidism    Significant Hospital Events: Including procedures, antibiotic start and stop dates in addition to other pertinent events   9/9 Admit with N/V/D, acute hypoxic respiratory failure with aspiration. CTA chest negative for dissection, PE but showed L>R lower airspace disease    Interim History / Subjective:  Patient reports feeling better, asking to come off bipap & get out of bed Tmax 99.1  On BiPAP  RN reports pt required two person assist to chair, pt felt weak   Objective   Blood pressure (!) 132/53, pulse 70, temperature 99.1 F (37.3 C), temperature source Axillary, resp. rate 19, height 5\' 2"  (1.575 m), weight 64.8 kg, SpO2 98 %.    FiO2 (%):  [100 %] 100 %   Intake/Output Summary (Last 24 hours) at 02/17/2022  04/19/2022 Last data filed at 02/17/2022 0500 Gross per 24 hour  Intake 0 ml  Output 600 ml  Net -600 ml   Filed Weights   02/16/22 1422 02/16/22 2035 02/17/22 0414  Weight: 61.2 kg 64.8 kg 64.8 kg   Examination: General: well preserved elderly female lying in bed in NAD on BiPAP  HEENT: MM pink/moist, BiPAP mask in place, pupils =/reactive  Neuro: AAOx4, speech clear, MAE  CV: s1s2 RRR, SR on monitor, no m/r/g PULM: non-labored at rest, lungs bilaterally clear anterior, diminished bases GI: soft, bsx4 active  Extremities: warm/dry, no edema  Skin: no rashes or lesions  Resolved Hospital Problem list     Assessment & Plan:   Acute Hypoxemic Respiratory Failure secondary to CAP, suspected Aspiration  CT chest confirms L>R lower airspace disease, concerning for aspiration given N/V.  PE & dissection ruled out. On Eliquis at baseline, no missed doses.  -wean O2 for sats >90% -pulmonary hygiene -IS, mobilize  -BiPAP PRN for increased WOB -follow intermittent CXR  -continue azithro, rocephin for CAP  -send urine strep antigen   History of hypertension -continue home norvasc -BP monitoring   DM II  -SSI, moderate scale   Hypothyroidism -synthroid    Paroxysmal Atrial Fibrillation  -continue home amiodarone, eliquis   Acute Kidney Injury  -hold diuresis with contrast  -encourage PO intake    Best Practice (right click and "Reselect all SmartList Selections" daily)  Diet/type: Regular consistency (see orders) DVT prophylaxis:  DOAC GI prophylaxis: N/A Lines: N/A Foley:  N/A Code Status:  full code Last date of multidisciplinary goals of care discussion: full code confirmed 9/9    Tx to PCU, to [facility] as of 9/11 am.       Canary Brim, MSN, APRN, NP-C, AGACNP-BC [facility] 02/17/2022, 9:21 AM   Please see [email or web address] for pager details.   From 7A-7P if no response, please call [phone] After hours, please call ELink [phone]

## 2022-02-18 ENCOUNTER — Inpatient Hospital Stay (HOSPITAL_COMMUNITY): Payer: Medicare HMO

## 2022-02-18 DIAGNOSIS — J9601 Acute respiratory failure with hypoxia: Secondary | ICD-10-CM | POA: Diagnosis not present

## 2022-02-18 DIAGNOSIS — J690 Pneumonitis due to inhalation of food and vomit: Secondary | ICD-10-CM

## 2022-02-18 LAB — CBC
HCT: 34.3 % — ABNORMAL LOW (ref 36.0–46.0)
Hemoglobin: 10.8 g/dL — ABNORMAL LOW (ref 12.0–15.0)
MCH: 30 pg (ref 26.0–34.0)
MCHC: 31.5 g/dL (ref 30.0–36.0)
MCV: 95.3 fL (ref 80.0–100.0)
Platelets: 142 10*3/uL — ABNORMAL LOW (ref 150–400)
RBC: 3.6 MIL/uL — ABNORMAL LOW (ref 3.87–5.11)
RDW: 15.9 % — ABNORMAL HIGH (ref 11.5–15.5)
WBC: 13.2 10*3/uL — ABNORMAL HIGH (ref 4.0–10.5)
nRBC: 0 % (ref 0.0–0.2)

## 2022-02-18 LAB — BASIC METABOLIC PANEL
Anion gap: 8 (ref 5–15)
BUN: 24 mg/dL — ABNORMAL HIGH (ref 8–23)
CO2: 22 mmol/L (ref 22–32)
Calcium: 8.6 mg/dL — ABNORMAL LOW (ref 8.9–10.3)
Chloride: 109 mmol/L (ref 98–111)
Creatinine, Ser: 1.44 mg/dL — ABNORMAL HIGH (ref 0.44–1.00)
GFR, Estimated: 35 mL/min — ABNORMAL LOW (ref >60)
Glucose, Bld: 107 mg/dL — ABNORMAL HIGH (ref 70–99)
Potassium: 4.6 mmol/L (ref 3.5–5.1)
Sodium: 139 mmol/L (ref 135–145)

## 2022-02-18 LAB — GLUCOSE, CAPILLARY
Glucose-Capillary: 89 mg/dL (ref 70–99)
Glucose-Capillary: 104 mg/dL — ABNORMAL HIGH (ref 70–99)
Glucose-Capillary: 157 mg/dL — ABNORMAL HIGH (ref 70–99)
Glucose-Capillary: 127 mg/dL — ABNORMAL HIGH (ref 70–99)
Glucose-Capillary: 182 mg/dL — ABNORMAL HIGH (ref 70–99)

## 2022-02-18 NOTE — Evaluation (Signed)
Physical Therapy Evaluation Patient Details Name: Cheryl Mora MRN: [id number] DOB: [birth year] Today's Date: 02/18/2022  History of Present Illness  88 yo female admitted 9/9 from [facility]F with N/V/D and PNA (suspect aspiration due to vomiting) PMHx: HTN, DM, paroxysmal Afib, hypothyroidism  Clinical Impression  Pt pleasant and eager to mobilize stating she is normally very independent and walks throughout the day with no history of falls but does state with her N/V/D leading to admission she did experience a fall. Pt with right knee pain, decreased cardiopulmonary function, decreased gait and strength who will benefit from acute therapy to maximize mobility, safety and function.   93-95% on 80% at [facility] with desaturation to 85% with limited gait       Recommendations for follow up therapy are one component of a multi-disciplinary discharge planning process, led by the attending physician.  Recommendations may be updated based on patient status, additional functional criteria and insurance authorization.  Follow Up Recommendations Home health PT      Assistance Recommended at Discharge Intermittent Supervision/Assistance  Patient can return home with the following  A little help with bathing/dressing/bathroom;Assistance with cooking/housework;A little help with walking and/or transfers    Equipment Recommendations Rolling walker (2 wheels)  Recommendations for Other Services       Functional Status Assessment Patient has had a recent decline in their functional status and demonstrates the ability to make significant improvements in function in a reasonable and predictable amount of time.     Precautions / Restrictions Precautions Precautions: Fall;Other (comment) Precaution Comments: watch sats      Mobility  Bed Mobility Overal bed mobility: Modified Independent                  Transfers Overall transfer level: Needs assistance   Transfers: Sit  to/from Stand Sit to Stand: Min guard           General transfer comment: guarding with HHA to rise from bed, BSC and recliner. cues for hand placement. min assist to pivot recliner to and from [facility]    Ambulation/Gait Ambulation/Gait assistance: Min assist Gait Distance (Feet): 7 Feet Assistive device: 1 person hand held assist Gait Pattern/deviations: Step-through pattern, Decreased stride length   Gait velocity interpretation: <1.8 ft/sec, indicate of risk for recurrent falls   General Gait Details: min HHA to walk 7' within length of [facility]NC with drop in sats to 85%, 2 min seated recovery with sats 93% before repeated trial  Stairs            Wheelchair Mobility    Modified Rankin (Stroke Patients Only)       Balance Overall balance assessment: Needs assistance Sitting-balance support: No upper extremity supported, Feet supported Sitting balance-Leahy Scale: Good     Standing balance support: Single extremity supported Standing balance-Leahy Scale: Poor Standing balance comment: unsteady with right knee pain requiring assist for balance                             Pertinent Vitals/Pain Pain Assessment Pain Assessment: No/denies pain    Home Living Family/patient expects to be discharged to:: Private residence Living Arrangements: Alone Available Help at Discharge: Personal care attendant Type of Home: Apartment Home Access: Elevator       Home Layout: One level Home Equipment: Cane - single point      Prior Function Prior Level of Function : Independent/Modified Independent  Mobility Comments: normally walks 5000steps /day without assist ADLs Comments: staff for cooking and cleaning     Hand Dominance        Extremity/Trunk Assessment   Upper Extremity Assessment Upper Extremity Assessment: Overall WFL for tasks assessed    Lower Extremity Assessment Lower Extremity Assessment: Generalized weakness     Cervical / Trunk Assessment Cervical / Trunk Assessment: Normal  Communication   Communication: No difficulties  Cognition Arousal/Alertness: Awake/alert Behavior During Therapy: WFL for tasks assessed/performed Overall Cognitive Status: Within Functional Limits for tasks assessed                                          General Comments      Exercises     Assessment/Plan    PT Assessment Patient needs continued PT services  PT Problem List Decreased strength;Decreased mobility;Decreased activity tolerance;Decreased balance;Decreased knowledge of use of DME       PT Treatment Interventions DME instruction;Gait training;Functional mobility training;Therapeutic activities;Patient/family education;Therapeutic exercise    PT Goals (Current goals can be found in the Care Plan section)  Acute Rehab PT Goals Patient Stated Goal: start swimming, walking PT Goal Formulation: With patient Time For Goal Achievement: 03/04/22 Potential to Achieve Goals: Good    Frequency Min 3X/week     Co-evaluation               AM-PAC PT "6 Clicks" Mobility  Outcome Measure Help needed turning from your back to your side while in a flat bed without using bedrails?: A Little Help needed moving from lying on your back to sitting on the side of a flat bed without using bedrails?: A Little Help needed moving to and from a bed to a chair (including a wheelchair)?: A Little Help needed standing up from a chair using your arms (e.g., wheelchair or bedside chair)?: A Little Help needed to walk in hospital room?: A Lot Help needed climbing 3-5 steps with a railing? : Total 6 Click Score: 15    End of Session Equipment Utilized During Treatment: Oxygen Activity Tolerance: Patient tolerated treatment well Patient left: in chair;with call bell/phone within reach;with chair alarm set Nurse Communication: Mobility status PT Visit Diagnosis: Other abnormalities of gait and  mobility (R26.89);Difficulty in walking, not elsewhere classified (R26.2);Muscle weakness (generalized) (M62.81)    Time: 3299-2426 PT Time Calculation (min) (ACUTE ONLY): 19 min   Charges:   PT Evaluation $PT Eval Moderate Complexity: 1 Mod          Amias Hutchinson P, PT Acute Rehabilitation Services Office: [phone]   Enedina Finner Ellayna Hilligoss 02/18/2022, 1:56 PM

## 2022-02-18 NOTE — Evaluation (Signed)
Clinical/Bedside Swallow Evaluation Patient Details  Name: Cheryl Mora MRN: [id number] Date of Birth: 01-31-1933  Today's Date: 02/18/2022 Time: SLP Start Time (ACUTE ONLY): 1000 SLP Stop Time (ACUTE ONLY): 1019 SLP Time Calculation (min) (ACUTE ONLY): 19 min  Past Medical History:  Past Medical History:  Diagnosis Date   Abnormal EKG    suggestive of previous anterior wall myocardial infarction   Atrial fibrillation (HCC)    On Eliquis   Diabetes mellitus (HCC)    Hypertension    Hypothyroidism    Past Surgical History:  Past Surgical History:  Procedure Laterality Date   ABDOMINAL HYSTERECTOMY     CARDIOVERSION N/A 04/13/2020   Procedure: CARDIOVERSION;  Surgeon: [name], MD;  Location: [facility] ENDOSCOPY;  Service: Cardiovascular;  Laterality: N/A;   CATARACT EXTRACTION     KNEE SURGERY     LEFT HEART CATH AND CORONARY ANGIOGRAPHY N/A 03/06/2020   Procedure: LEFT HEART CATH AND CORONARY ANGIOGRAPHY;  Surgeon: [place], Peter M, MD;  Location: [facility] INVASIVE CV LAB;  Service: Cardiovascular;  Laterality: N/A;   HPI:  Pt is an 88 yo female presenting from ALF with N/V/D followed by shortness of breath. CT Chest shows L > R airspace disease concerning for aspiration. PMH includes: afib, DM, HTN, hypothyroidism    Assessment / Plan / Recommendation  Clinical Impression  Pt has mild hoarseness at baseline that she reports to be new this admission, as well as a mild baseline cough. Coughing is noted intermittently across all PO trials, regardless of consistency, but potentially increasing in frequency from her baseline. Pt however denies any h/o dysphagia and has not had PNA in over 60 years. She says her shortness of breath had started after an episode of vomiting, which she believes to have been instigated by eating some cheese that was no longer safe for consumption. If her PNA is from aspiration, highly suspect that it was from post-prandial aspiration in the setting of emesis.  She does however have some risk factors at the moment, including change in vocal quality, decreased respiratory status with high O2 needs, deconditioning, and decreased mobility. Discussed the option of pursuing instrumental testing (would have to be FEES at this point based on current O2), but pt prefers to hold on this for now in favor of using careful aspiration precautions with current diet ordered by MD. Encouraged her to monitor for coughing or any signs of possible dysphagia closely. SLP will f/u to see if instrumental testing is indicated. SLP Visit Diagnosis: Dysphagia, unspecified (R13.10)    Aspiration Risk  Mild aspiration risk;Moderate aspiration risk    Diet Recommendation Regular;Thin liquid   Liquid Administration via: Cup;Straw Medication Administration: Whole meds with puree Supervision: Patient able to self feed;Intermittent supervision to cue for compensatory strategies Compensations: Slow rate;Small sips/bites Postural Changes: Seated upright at 90 degrees;Remain upright for at least 30 minutes after po intake    Other  Recommendations Oral Care Recommendations: Oral care BID    Recommendations for follow up therapy are one component of a multi-disciplinary discharge planning process, led by the attending physician.  Recommendations may be updated based on patient status, additional functional criteria and insurance authorization.  Follow up Recommendations  (tba)      Assistance Recommended at Discharge PRN  Functional Status Assessment Patient has had a recent decline in their functional status and demonstrates the ability to make significant improvements in function in a reasonable and predictable amount of time.  Frequency and Duration min 2x/week  1 week       Prognosis Prognosis for Safe Diet Advancement: Good      Swallow Study   General HPI: Pt is an 88 yo female presenting from ALF with N/V/D followed by shortness of breath. CT Chest shows L > R  airspace disease concerning for aspiration. PMH includes: afib, DM, HTN, hypothyroidism Type of Study: Bedside Swallow Evaluation Previous Swallow Assessment: none in chart Diet Prior to this Study: Regular;Thin liquids Temperature Spikes Noted: No Respiratory Status: Nasal cannula (80% FiO2 30 L) History of Recent Intubation: No Behavior/Cognition: Alert;Cooperative;Pleasant mood Oral Cavity Assessment: Within Functional Limits Oral Care Completed by SLP: No Oral Cavity - Dentition: Adequate natural dentition Vision: Functional for self-feeding Self-Feeding Abilities: Able to feed self Patient Positioning: Upright in bed Baseline Vocal Quality: Hoarse (mild) Volitional Cough: Strong Volitional Swallow: Able to elicit    Oral/Motor/Sensory Function Overall Oral Motor/Sensory Function: Within functional limits   Ice Chips Ice chips: Not tested   Thin Liquid Thin Liquid: Impaired Presentation: Cup;Self Fed;Straw Pharyngeal  Phase Impairments: Cough - Delayed    Nectar Thick Nectar Thick Liquid: Not tested   Honey Thick Honey Thick Liquid: Not tested   Puree Puree: Impaired Presentation: Self Fed;Spoon Pharyngeal Phase Impairments: Cough - Delayed   Solid     Solid: Impaired Presentation: Self Fed Pharyngeal Phase Impairments: Cough - Delayed      Mahala Menghini., M.A. CCC-SLP Acute Rehabilitation Services Office [phone]  Secure chat preferred  02/18/2022,10:46 AM

## 2022-02-18 NOTE — Progress Notes (Addendum)
PROGRESS NOTE    Cheryl Mora  CHY:[id number] DOB: 06-04-1933 DOA: 02/16/2022 PCP: Eartha Inch, MD   Chief Complaint  Patient presents with   Emesis    Brief Narrative:    88 y/o F, [place] resident, admitted 9/9 with reports of N/V/D.     Symptoms started about 10 AM on 9/9, went to the bathroom and had a diarrhea, there was associated vomiting.  Vomited about 4-5 times.  Did have some lightheadedness, developed lower abdominal pain with radiation to the back.   Lives at ALF, walks 5,000 steps per day. Usually healthy with no respiratory complaints Able to exert herself normally without limitations. Has felt short of breath since having the vomiting episodes. Drinks actively 3 oz of gin per day.  Reformed smoker-no history of lung disease   No contact with anybody with a febrile illness Did not have a cough or respiratory complaint prior to onset of vomiting today.  Assessment & Plan:   Principal Problem:   Acute hypoxemic respiratory failure (HCC)  Acute Hypoxemic Respiratory Failure secondary to CAP, suspected Aspiration  - CT chest confirms extensive bilateral lobe pneumonia L>R lower airspace disease, concerning for aspiration given N/V.   - PE & dissection ruled out. On Eliquis at baseline, no missed doses.  -Continue with IV Rocephin and azithromycin. -She was encouraged to use incentive spirometry and flutter valve today after they will be provided by the staff. -Wean oxygen as tolerated, remains on heated high flow 80% 50 L. -She required BiPAP initially, continue with BiPAP as needed for increased work of breathing -Urine strep pneumonia antigen negative -Patient with pneumonia most likely due to vomiting episode, no recurrence, will tell request SLP to rule out any swallowing issues   History of hypertension -continue home norvasc -BP monitoring    DM II  -SSI, moderate scale    Hypothyroidism -synthroid     Paroxysmal Atrial Fibrillation   -continue home amiodarone, eliquis    Acute Kidney Injury  -hold diuresis with contrast  -encourage PO intake     DVT prophylaxis: Apixaban Code Status: Full Family Communication: none at bedside Disposition:   Status is: Inpatient    Consultants:  PCCM   Subjective:    Objective: Vitals:   02/18/22 0500 02/18/22 0600 02/18/22 0700 02/18/22 0830  BP: 132/62 126/60 (!) 125/53   Pulse: 68 69 69 70  Resp: 19 (!) 26 (!) 23 19  Temp:    98.5 F (36.9 C)  TempSrc:    Oral  SpO2: 94% 92% 95% 94%  Weight:      Height:        Intake/Output Summary (Last 24 hours) at 02/18/2022 0943 Last data filed at 02/18/2022 0500 Gross per 24 hour  Intake 696.08 ml  Output 1075 ml  Net -378.92 ml   Filed Weights   02/16/22 1422 02/16/22 2035 02/17/22 0414  Weight: 61.2 kg 64.8 kg 64.8 kg    Examination:  Awake Alert, Oriented X 3, No new F.N deficits, pale, deconditioned Symmetrical Chest wall movement, managed air entry at the bases with coarse respiratory sounds RRR,No Gallops,Rubs or new Murmurs, No Parasternal Heave +ve B.Sounds, Abd Soft, No tenderness, No rebound - guarding or rigidity. No Cyanosis, Clubbing or edema, No new Rash or bruise      Data Reviewed: I have personally reviewed following labs and imaging studies  CBC: Recent Labs  Lab 02/16/22 1436 02/17/22 0738 02/18/22 0504  WBC 10.0 8.9 13.2*  NEUTROABS 9.2*  --   --  HGB 12.8 11.1* 10.8*  HCT 40.4 35.4* 34.3*  MCV 95.3 96.5 95.3  PLT 194 152 142*    Basic Metabolic Panel: Recent Labs  Lab 02/16/22 1436 02/17/22 0738 02/18/22 0504  NA 140 143 139  K 4.0 3.7 4.6  CL 106 109 109  CO2 21* 19* 22  GLUCOSE 176* 124* 107*  BUN 27* 22 24*  CREATININE 1.33* 1.24* 1.44*  CALCIUM 8.6* 8.1* 8.6*  MG  --  1.7  --   PHOS  --  4.8*  --     GFR: Estimated Creatinine Clearance: 24.3 mL/min (A) (by C-G formula based on SCr of 1.44 mg/dL (H)).  Liver Function Tests: Recent Labs  Lab  02/16/22 1436  AST 43*  ALT 44  ALKPHOS 72  BILITOT 0.9  PROT 6.7  ALBUMIN 3.7    CBG: Recent Labs  Lab 02/17/22 1502 02/17/22 2015 02/17/22 2325 02/18/22 0352 02/18/22 0742  GLUCAP 103* 136* 96 89 104*     Recent Results (from the past 240 hour(s))  Blood culture (routine x 2)     Status: None (Preliminary result)   Collection Time: 02/16/22  2:43 PM   Specimen: BLOOD  Result Value Ref Range Status   Specimen Description BLOOD LEFT ANTECUBITAL  Final   Special Requests   Final    BOTTLES DRAWN AEROBIC AND ANAEROBIC Blood Culture adequate volume   Culture   Final    NO GROWTH 2 DAYS Performed at [facility], [address], [place], [place] [address]    Report Status PENDING  Incomplete  SARS Coronavirus 2 by RT PCR (hospital order, performed in [facility] hospital lab) *cepheid single result test* Anterior Nasal Swab     Status: None   Collection Time: 02/16/22  2:44 PM   Specimen: Anterior Nasal Swab  Result Value Ref Range Status   SARS Coronavirus 2 by RT PCR NEGATIVE NEGATIVE Final    Comment: (NOTE) SARS-CoV-2 target nucleic acids are NOT DETECTED.  The SARS-CoV-2 RNA is generally detectable in upper and lower respiratory specimens during the acute phase of infection. The lowest concentration of SARS-CoV-2 viral copies this assay can detect is 250 copies / mL. A negative result does not preclude SARS-CoV-2 infection and should not be used as the sole basis for treatment or other patient management decisions.  A negative result may occur with improper specimen collection / handling, submission of specimen other than nasopharyngeal swab, presence of viral mutation(s) within the areas targeted by this assay, and inadequate number of viral copies (<250 copies / mL). A negative result must be combined with clinical observations, patient history, and epidemiological information.  Fact Sheet for Patients:    RoadLapTop.co.za  Fact Sheet for Healthcare Providers: http://kim-miller.com/  This test is not yet approved or  cleared by the [place] FDA and has been authorized for detection and/or diagnosis of SARS-CoV-2 by FDA under an Emergency Use Authorization (EUA).  This EUA will remain in effect (meaning this test can be used) for the duration of the COVID-19 declaration under Section 564(b)(1) of the Act, 21 U.S.C. section 360bbb-3(b)(1), unless the authorization is terminated or revoked sooner.  Performed at [facility], [address], [place], [place] [address]   Blood culture (routine x 2)     Status: None (Preliminary result)   Collection Time: 02/16/22  8:42 PM   Specimen: BLOOD LEFT HAND  Result Value Ref Range Status   Specimen Description BLOOD LEFT HAND  Final   Special Requests   Final    BOTTLES DRAWN AEROBIC AND ANAEROBIC Blood Culture results may not be optimal due to an inadequate volume of blood received in culture bottles   Culture   Final    NO GROWTH 2 DAYS Performed at [facility], [address], [place], [place] [address]    Report Status PENDING  Incomplete  MRSA Next Gen by PCR, Nasal     Status: None   Collection Time: 02/16/22  9:07 PM   Specimen: Nasal Mucosa; Nasal Swab  Result Value Ref Range Status   MRSA by PCR Next Gen NOT DETECTED NOT DETECTED Final    Comment: (NOTE) The GeneXpert MRSA Assay (FDA approved for NASAL specimens only), is one component of a comprehensive MRSA colonization surveillance program. It is not intended to diagnose MRSA infection nor to guide or monitor treatment for MRSA infections. Test performance is not FDA approved in patients less than 31 years old. Performed at [facility], [address] [place], [place] [address]          Radiology Studies: DG CHEST PORT 1 VIEW  Result Date: 02/18/2022 CLINICAL DATA:  Acute respiratory failure with  hypoxia. EXAM: PORTABLE CHEST 1 VIEW COMPARISON:  02/16/22 FINDINGS: Stable cardiomediastinal contours. There is been progressive airspace consolidation involving the entire left lower lobe and medial right lung base. Possible small new left pleural effusion. Visualized osseous structures appear intact. IMPRESSION: 1. Progressive airspace consolidation involving the left lower lobe and medial right lung base. 2. Possible small new left pleural effusion. Electronically Signed   By: Signa Kell M.D.   On: 02/18/2022 06:33   CT Angio Chest/Abd/Pel for Dissection W and/or Wo Contrast  Result Date: 02/16/2022 CLINICAL DATA:  Chest and back pain. EXAM: CT ANGIOGRAPHY CHEST, ABDOMEN AND PELVIS TECHNIQUE: Non-contrast CT of the chest was initially obtained. Multidetector CT imaging through the chest, abdomen and pelvis was performed using the standard protocol during bolus administration of intravenous contrast. Multiplanar reconstructed images and MIPs were obtained and reviewed to evaluate the vascular anatomy. RADIATION DOSE REDUCTION: This exam was performed according to the departmental dose-optimization program which includes automated exposure control, adjustment of the mA and/or kV according to patient size and/or use of iterative reconstruction technique. CONTRAST:  39mL OMNIPAQUE IOHEXOL 350 MG/ML SOLN COMPARISON:  None Available. FINDINGS: CTA CHEST FINDINGS Cardiovascular: Preferential opacification of the thoracic aorta. No evidence of thoracic aortic aneurysm or dissection. The heart is mildly enlarged. There is no pericardial effusion. There is no central pulmonary embolism. There are atherosclerotic calcifications of the aorta. Mediastinum/Nodes: Thyroid gland is not visualized. There are no enlarged mediastinal or hilar lymph nodes. Esophagus is unremarkable. Lungs/Pleura: Mild emphysematous changes are present. There is airspace consolidation throughout the left lower lobe. There is also moderate  amount of airspace disease in the right lower lobe and a small amount of airspace disease in the lingula and right middle lobe. No pleural effusion or pneumothorax. Musculoskeletal: No chest wall abnormality. No acute or significant osseous findings. Review of the MIP images confirms the above findings. CTA ABDOMEN AND PELVIS FINDINGS VASCULAR Aorta: Normal caliber aorta without aneurysm, dissection, vasculitis or significant stenosis. There is moderate calcified atherosclerotic disease throughout the aorta. Celiac: Patent without evidence of aneurysm, dissection, vasculitis or significant stenosis. SMA: Patent without evidence of aneurysm, dissection, vasculitis or significant stenosis. Renals: Both renal arteries are patent without evidence of aneurysm, dissection, vasculitis, fibromuscular dysplasia or significant stenosis. IMA:  Patent without evidence of aneurysm, dissection, vasculitis or significant stenosis. Inflow: Patent without evidence of aneurysm, dissection, vasculitis or significant stenosis. Veins: No obvious venous abnormality within the limitations of this arterial phase study. Review of the MIP images confirms the above findings. NON-VASCULAR Hepatobiliary: No focal liver abnormality is seen. No gallstones, gallbladder wall thickening, or biliary dilatation. Pancreas: Pancreatic body and tail are atrophic. Focal calcifications are seen in the mid pancreatic body. The pancreatic head and neck appear within normal limits. Spleen: Normal in size without focal abnormality. Adrenals/Urinary Tract: There is a 3.5 cm cyst in the left kidney. There is a 9 mm cyst in the right kidney. There is no hydronephrosis or perinephric fat stranding. The adrenal glands and bladder are within normal limits. Stomach/Bowel: Stomach is within normal limits. Appendix is not seen. No evidence of bowel wall thickening, distention, or inflammatory changes. There is diffuse colonic diverticulosis. Lymphatic: No enlarged lymph  nodes are identified. Reproductive: Status post hysterectomy. No adnexal masses. Other: No abdominal wall hernia or abnormality. No abdominopelvic ascites. Musculoskeletal: Degenerative changes affect the spine. Review of the MIP images confirms the above findings. IMPRESSION: 1. No evidence for aortic dissection or aneurysm. 2. Multifocal airspace consolidation compatible with pneumonia. This is most significant in the bilateral lower lobes, left greater than right. Recommend follow-up imaging in 4-6 weeks to confirm resolution. 3. Mild cardiomegaly. 4. Chronic appearing atrophy of the pancreatic body and tail. 5. Colonic diverticulosis. 6. Multiple lesions, including 3.5 cm left Bosniak 1 benign simple cyst. No follow-up imaging is recommended. JACR 2018 Feb; 264-273, Management of the Incidental RenalMass on CT, RadioGraphics 2021; 814-848, Bosniak Classification of Cystic Renal Masses, Version 2019. 7. Aortic Atherosclerosis (ICD10-I70.0) and Emphysema (ICD10-J43.9). Electronically Signed   By: Darliss Cheney M.D.   On: 02/16/2022 20:52   DG Chest Portable 1 View  Result Date: 02/16/2022 CLINICAL DATA:  Cough and hypoxia. EXAM: PORTABLE CHEST 1 VIEW COMPARISON:  03/06/2020 radiograph FINDINGS: The cardiomediastinal silhouette is unremarkable. Mild peribronchial thickening and mild bibasilar atelectasis/scarring again noted. There is no evidence of focal airspace disease, pulmonary edema, suspicious pulmonary nodule/mass, pleural effusion, or pneumothorax. No acute bony abnormalities are identified. IMPRESSION: No evidence of acute cardiopulmonary disease. Electronically Signed   By: Harmon Pier M.D.   On: 02/16/2022 15:42        Scheduled Meds:  amiodarone  200 mg Oral Daily   amLODipine  5 mg Oral Daily   apixaban  5 mg Oral BID   ascorbic acid  250 mg Oral BID   calcium citrate  200 mg of elemental calcium Oral Daily   Chlorhexidine Gluconate Cloth  6 each Topical Daily   dorzolamide-timolol  1  drop Left Eye BID   insulin aspart  0-15 Units Subcutaneous Q4H   levothyroxine  75 mcg Oral Q0600   rosuvastatin  20 mg Oral Daily   cyanocobalamin  100 mcg Oral Daily   Continuous Infusions:  azithromycin Stopped (02/17/22 2342)   cefTRIAXone (ROCEPHIN)  IV Stopped (02/17/22 2216)     LOS: 2 days     Huey Bienenstock, MD [facility]   To contact the attending provider between 7A-7P or the covering provider during after hours 7P-7A, please log into the web site [email or web address] and access using universal [place] password for that web site. If you do not have the password, please call the hospital operator.  02/18/2022, 9:43 AM

## 2022-02-19 DIAGNOSIS — I4891 Unspecified atrial fibrillation: Secondary | ICD-10-CM | POA: Diagnosis not present

## 2022-02-19 DIAGNOSIS — I159 Secondary hypertension, unspecified: Secondary | ICD-10-CM | POA: Diagnosis not present

## 2022-02-19 DIAGNOSIS — I4819 Other persistent atrial fibrillation: Secondary | ICD-10-CM

## 2022-02-19 DIAGNOSIS — E119 Type 2 diabetes mellitus without complications: Secondary | ICD-10-CM

## 2022-02-19 DIAGNOSIS — J9601 Acute respiratory failure with hypoxia: Secondary | ICD-10-CM | POA: Diagnosis not present

## 2022-02-19 LAB — BASIC METABOLIC PANEL
Anion gap: 9 (ref 5–15)
BUN: 21 mg/dL (ref 8–23)
CO2: 21 mmol/L — ABNORMAL LOW (ref 22–32)
Calcium: 8.3 mg/dL — ABNORMAL LOW (ref 8.9–10.3)
Chloride: 107 mmol/L (ref 98–111)
Creatinine, Ser: 1.01 mg/dL — ABNORMAL HIGH (ref 0.44–1.00)
GFR, Estimated: 54 mL/min — ABNORMAL LOW (ref >60)
Glucose, Bld: 117 mg/dL — ABNORMAL HIGH (ref 70–99)
Potassium: 3.1 mmol/L — ABNORMAL LOW (ref 3.5–5.1)
Sodium: 137 mmol/L (ref 135–145)

## 2022-02-19 LAB — CBC
HCT: 33.6 % — ABNORMAL LOW (ref 36.0–46.0)
Hemoglobin: 11 g/dL — ABNORMAL LOW (ref 12.0–15.0)
MCH: 30.3 pg (ref 26.0–34.0)
MCHC: 32.7 g/dL (ref 30.0–36.0)
MCV: 92.6 fL (ref 80.0–100.0)
Platelets: 148 10*3/uL — ABNORMAL LOW (ref 150–400)
RBC: 3.63 MIL/uL — ABNORMAL LOW (ref 3.87–5.11)
RDW: 15.7 % — ABNORMAL HIGH (ref 11.5–15.5)
WBC: 12.4 10*3/uL — ABNORMAL HIGH (ref 4.0–10.5)
nRBC: 0 % (ref 0.0–0.2)

## 2022-02-19 LAB — GLUCOSE, CAPILLARY
Glucose-Capillary: 111 mg/dL — ABNORMAL HIGH (ref 70–99)
Glucose-Capillary: 116 mg/dL — ABNORMAL HIGH (ref 70–99)
Glucose-Capillary: 121 mg/dL — ABNORMAL HIGH (ref 70–99)
Glucose-Capillary: 143 mg/dL — ABNORMAL HIGH (ref 70–99)
Glucose-Capillary: 161 mg/dL — ABNORMAL HIGH (ref 70–99)
Glucose-Capillary: 183 mg/dL — ABNORMAL HIGH (ref 70–99)
Glucose-Capillary: 197 mg/dL — ABNORMAL HIGH (ref 70–99)

## 2022-02-19 LAB — MAGNESIUM: Magnesium: 1.8 mg/dL (ref 1.7–2.4)

## 2022-02-19 MED ORDER — GUAIFENESIN ER 600 MG PO TB12
600.0000 mg | ORAL_TABLET | Freq: Two times a day (BID) | ORAL | Status: DC
  Administered 2022-02-19 – 2022-02-25 (×13): 600 mg via ORAL
  Filled 2022-02-19 (×13): qty 1

## 2022-02-19 MED ORDER — POTASSIUM CHLORIDE CRYS ER 20 MEQ PO TBCR: 40.0000 meq | EXTENDED_RELEASE_TABLET | Freq: Once | ORAL | Status: DC

## 2022-02-19 MED ORDER — POTASSIUM CHLORIDE 10 MEQ/100ML IV SOLN
10.0000 meq | INTRAVENOUS | Status: AC
  Administered 2022-02-19 (×3): 10 meq via INTRAVENOUS
  Filled 2022-02-19 (×2): qty 100

## 2022-02-19 MED ORDER — POTASSIUM CHLORIDE 10 MEQ/100ML IV SOLN
10.0000 meq | INTRAVENOUS | Status: DC
  Administered 2022-02-19: 10 meq via INTRAVENOUS
  Filled 2022-02-19 (×2): qty 100

## 2022-02-19 MED ORDER — INSULIN ASPART 100 UNIT/ML IJ SOLN
0.0000 [IU] | Freq: Three times a day (TID) | INTRAMUSCULAR | Status: DC
  Administered 2022-02-19: 2 [IU] via SUBCUTANEOUS
  Administered 2022-02-19 – 2022-02-20 (×4): 3 [IU] via SUBCUTANEOUS
  Administered 2022-02-20: 2 [IU] via SUBCUTANEOUS
  Administered 2022-02-21 (×3): 3 [IU] via SUBCUTANEOUS
  Administered 2022-02-22: 5 [IU] via SUBCUTANEOUS

## 2022-02-19 MED ORDER — SODIUM CHLORIDE 0.9 % IV SOLN: INTRAVENOUS | Status: DC | PRN

## 2022-02-19 MED ORDER — POTASSIUM CHLORIDE CRYS ER 20 MEQ PO TBCR
20.0000 meq | EXTENDED_RELEASE_TABLET | ORAL | Status: AC
  Administered 2022-02-19 (×2): 20 meq via ORAL
  Filled 2022-02-19 (×2): qty 1

## 2022-02-19 NOTE — Progress Notes (Signed)
PROGRESS NOTE    Cheryl Mora  FGH:[id number] DOB: 06-17-1932 DOA: 02/16/2022 PCP: Eartha Inch, MD   Chief Complaint  Patient presents with   Emesis    Brief Narrative:    88 y/o F, [facility] resident, admitted 9/9 with reports of N/V/D.     Symptoms started about 10 AM on 9/9, went to the bathroom and had a diarrhea, there was associated vomiting.  Vomited about 4-5 times.  Did have some lightheadedness, developed lower abdominal pain with radiation to the back.   Lives at ALF, walks 5,000 steps per day. Usually healthy with no respiratory complaints Able to exert herself normally without limitations. Has felt short of breath since having the vomiting episodes. Drinks actively 3 oz of gin per day.  Reformed smoker-no history of lung disease   No contact with anybody with a febrile illness Did not have a cough or respiratory complaint prior to onset of vomiting today.  Assessment & Plan:   Principal Problem:   Acute hypoxemic respiratory failure (HCC) Active Problems:   Hyperlipidemia   Type 2 diabetes mellitus without complication, without long-term current use of insulin (HCC)   HTN (hypertension)   Atrial fibrillation with RVR (HCC)   Persistent atrial fibrillation (HCC)  Acute Hypoxemic Respiratory Failure secondary to CAP, suspected Aspiration  - CT chest confirms extensive bilateral lobe pneumonia L>R lower airspace disease, concerning for aspiration given N/V.   - PE & dissection ruled out. On Eliquis at baseline, no missed doses.  -Continue with IV Rocephin and azithromycin. -She was encouraged to use incentive spirometry and flutter valve today after they will be provided by the staff. -Wean oxygen as tolerated, some improvement of oxygen requirement today, she is down to 80%, 30 L heated high flow nasal cannula this morning . -She required BiPAP initially, continue with BiPAP as needed for increased work of breathing -Urine strep pneumonia antigen  negative -Patient with pneumonia most likely due to vomiting episode, no recurrence. -He was encouraged to use incentive spirometry and flutter valve  Paroxysmal Atrial Fibrillation  -continue home amiodarone, eliquis   Bradycardia/syncope -Cardiac and syncopal event this morning during pain over the IV site from [place]-Cee-L, most likely vasovagal, continue with telemetry monitoring, potassium is low we will correct as well, will check magnesium level and correct if needed -will Check EKG, and continue with amiodarone as long QTc is appropriate  History of hypertension -continue home norvasc -BP monitoring    DM II  -SSI, moderate scale    Hypothyroidism -synthroid      Acute Kidney Injury  -hold diuresis with contrast  -encourage PO intake     DVT prophylaxis: Apixaban Code Status: Full Family Communication: none at bedside Disposition:   Status is: Inpatient    Consultants:  PCCM   Subjective:  Patient reports he is feeling better, dyspnea has improved, she is having phlegm and feeling congestion but not able to produce any yet. -Patient had an episode of bradycardia and presyncope little late in a.m. with heart rate up to 39, she became unresponsive, she was complaining of pain at IV site KCl infusion, back to baseline.  Objective: Vitals:   02/19/22 0600 02/19/22 0700 02/19/22 0729 02/19/22 0746  BP: (!) 129/59 (!) 140/60    Pulse: 63 63  66  Resp: 14 20  (!) 22  Temp:   97.9 F (36.6 C)   TempSrc:   Oral   SpO2: 97% 99%  95%  Weight:  Height:        Intake/Output Summary (Last 24 hours) at 02/19/2022 0944 Last data filed at 02/19/2022 0552 Gross per 24 hour  Intake 333.78 ml  Output 1150 ml  Net -816.22 ml   Filed Weights   02/16/22 2035 02/17/22 0414 02/19/22 0500  Weight: 64.8 kg 64.8 kg 65.1 kg    Examination:  Awake Alert, Oriented X 3, frail Symmetrical Chest wall movement, coarse respiratory sounds on lung bases bilaterally RRR,No  Gallops,Rubs or new Murmurs, No Parasternal Heave +ve B.Sounds, Abd Soft, No tenderness, No rebound - guarding or rigidity. No Cyanosis, Clubbing or edema, No new Rash or bruise         Data Reviewed: I have personally reviewed following labs and imaging studies  CBC: Recent Labs  Lab 02/16/22 1436 02/17/22 0738 02/18/22 0504 02/19/22 0507  WBC 10.0 8.9 13.2* 12.4*  NEUTROABS 9.2*  --   --   --   HGB 12.8 11.1* 10.8* 11.0*  HCT 40.4 35.4* 34.3* 33.6*  MCV 95.3 96.5 95.3 92.6  PLT 194 152 142* 148*    Basic Metabolic Panel: Recent Labs  Lab 02/16/22 1436 02/17/22 0738 02/18/22 0504 02/19/22 0507  NA 140 143 139 137  K 4.0 3.7 4.6 3.1*  CL 106 109 109 107  CO2 21* 19* 22 21*  GLUCOSE 176* 124* 107* 117*  BUN 27* 22 24* 21  CREATININE 1.33* 1.24* 1.44* 1.01*  CALCIUM 8.6* 8.1* 8.6* 8.3*  MG  --  1.7  --   --   PHOS  --  4.8*  --   --     GFR: Estimated Creatinine Clearance: 34.8 mL/min (A) (by C-G formula based on SCr of 1.01 mg/dL (H)).  Liver Function Tests: Recent Labs  Lab 02/16/22 1436  AST 43*  ALT 44  ALKPHOS 72  BILITOT 0.9  PROT 6.7  ALBUMIN 3.7    CBG: Recent Labs  Lab 02/18/22 1932 02/19/22 0027 02/19/22 0412 02/19/22 0727 02/19/22 0928  GLUCAP 182* 143* 116* 111* 197*     Recent Results (from the past 240 hour(s))  Blood culture (routine x 2)     Status: None (Preliminary result)   Collection Time: 02/16/22  2:43 PM   Specimen: BLOOD  Result Value Ref Range Status   Specimen Description BLOOD LEFT ANTECUBITAL  Final   Special Requests   Final    BOTTLES DRAWN AEROBIC AND ANAEROBIC Blood Culture adequate volume   Culture   Final    NO GROWTH 3 DAYS Performed at [facility], [address], [place], [place] [address]    Report Status PENDING  Incomplete  SARS Coronavirus 2 by RT PCR (hospital order, performed in [facility] hospital lab) *cepheid single result test* Anterior Nasal Swab     Status: None   Collection  Time: 02/16/22  2:44 PM   Specimen: Anterior Nasal Swab  Result Value Ref Range Status   SARS Coronavirus 2 by RT PCR NEGATIVE NEGATIVE Final    Comment: (NOTE) SARS-CoV-2 target nucleic acids are NOT DETECTED.  The SARS-CoV-2 RNA is generally detectable in upper and lower respiratory specimens during the acute phase of infection. The lowest concentration of SARS-CoV-2 viral copies this assay can detect is 250 copies / mL. A negative result does not preclude SARS-CoV-2 infection and should not be used as the sole basis for treatment or other patient management decisions.  A negative result may occur with improper specimen collection / handling, submission of specimen  other than nasopharyngeal swab, presence of viral mutation(s) within the areas targeted by this assay, and inadequate number of viral copies (<250 copies / mL). A negative result must be combined with clinical observations, patient history, and epidemiological information.  Fact Sheet for Patients:   https://www.patel.info/  Fact Sheet for Healthcare Providers: https://hall.com/  This test is not yet approved or  cleared by the [place] FDA and has been authorized for detection and/or diagnosis of SARS-CoV-2 by FDA under an Emergency Use Authorization (EUA).  This EUA will remain in effect (meaning this test can be used) for the duration of the COVID-19 declaration under Section 564(b)(1) of the Act, 21 U.S.C. section 360bbb-3(b)(1), unless the authorization is terminated or revoked sooner.  Performed at [facility], [place] [address], [place], [place] [address]   Blood culture (routine x 2)     Status: None (Preliminary result)   Collection Time: 02/16/22  8:42 PM   Specimen: BLOOD LEFT HAND  Result Value Ref Range Status   Specimen Description BLOOD LEFT HAND  Final   Special Requests   Final    BOTTLES DRAWN AEROBIC AND ANAEROBIC Blood Culture results may not  be optimal due to an inadequate volume of blood received in culture bottles   Culture   Final    NO GROWTH 3 DAYS Performed at [facility], [place] [address] [place], [place] [address]    Report Status PENDING  Incomplete  MRSA Next Gen by PCR, Nasal     Status: None   Collection Time: 02/16/22  9:07 PM   Specimen: Nasal Mucosa; Nasal Swab  Result Value Ref Range Status   MRSA by PCR Next Gen NOT DETECTED NOT DETECTED Final    Comment: (NOTE) The GeneXpert MRSA Assay (FDA approved for NASAL specimens only), is one component of a comprehensive MRSA colonization surveillance program. It is not intended to diagnose MRSA infection nor to guide or monitor treatment for MRSA infections. Test performance is not FDA approved in patients less than 63 years old. Performed at [facility], [place] [address], [place], [place] [address]          Radiology Studies: DG CHEST PORT 1 VIEW  Result Date: 02/18/2022 CLINICAL DATA:  Acute respiratory failure with hypoxia. EXAM: PORTABLE CHEST 1 VIEW COMPARISON:  02/16/22 FINDINGS: Stable cardiomediastinal contours. There is been progressive airspace consolidation involving the entire left lower lobe and medial right lung base. Possible small new left pleural effusion. Visualized osseous structures appear intact. IMPRESSION: 1. Progressive airspace consolidation involving the left lower lobe and medial right lung base. 2. Possible small new left pleural effusion. Electronically Signed   By: Kerby Moors M.D.   On: 02/18/2022 06:33        Scheduled Meds:  amiodarone  200 mg Oral Daily   amLODipine  5 mg Oral Daily   apixaban  5 mg Oral BID   ascorbic acid  250 mg Oral BID   calcium citrate  200 mg of elemental calcium Oral Daily   Chlorhexidine Gluconate Cloth  6 each Topical Daily   dorzolamide-timolol  1 drop Left Eye BID   insulin aspart  0-15 Units Subcutaneous Q4H   levothyroxine  75 mcg Oral Q0600   potassium chloride  20 mEq Oral  Q4H   potassium chloride  40 mEq Oral Once   rosuvastatin  20 mg Oral Daily   cyanocobalamin  100 mcg Oral Daily   Continuous Infusions:  azithromycin 250  mL/hr at 02/19/22 0100   cefTRIAXone (ROCEPHIN)  IV Stopped (02/18/22 2239)     LOS: 3 days     Phillips Climes, MD [facility]   To contact the attending provider between 7A-7P or the covering provider during after hours 7P-7A, please log into the web site [email or web address] and access using universal [place] password for that web site. If you do not have the password, please call the hospital operator.  02/19/2022, 9:44 AM

## 2022-02-19 NOTE — Progress Notes (Signed)
Physical Therapy Treatment Patient Details Name: Cheryl Mora MRN: [id number] DOB: December 09, 1932 Today's Date: 02/19/2022   History of Present Illness 88 yo female admitted 9/9 from [facility]F with N/V/D and PNA (suspect aspiration due to vomiting) PMHx: HTN, DM, paroxysmal Afib, hypothyroidism    PT Comments    Patient progressing well towards PT goals. Session focused on progressive ambulation and increased activity tolerance. Requires min A for standing and walking today secondary to weakness and right knee instability. Continues to require standing/seated rest breaks between walking bouts due to drop in Sp02 and SOB.  Sp02 dropped to 84% at the lowest during activity on 30 L HHFNC. Will continue to follow and progress as tolerated.   Recommendations for follow up therapy are one component of a multi-disciplinary discharge planning process, led by the attending physician.  Recommendations may be updated based on patient status, additional functional criteria and insurance authorization.  Follow Up Recommendations  Home health PT     Assistance Recommended at Discharge Intermittent Supervision/Assistance  Patient can return home with the following A little help with bathing/dressing/bathroom;Assistance with cooking/housework;A little help with walking and/or transfers   Equipment Recommendations  Rolling walker (2 wheels)    Recommendations for Other Services       Precautions / Restrictions Precautions Precautions: Fall;Other (comment) Precaution Comments: watch sats Restrictions Weight Bearing Restrictions: No     Mobility  Bed Mobility Overal bed mobility: Modified Independent             General bed mobility comments: increased time.    Transfers Overall transfer level: Needs assistance Equipment used: 1 person hand held assist Transfers: Sit to/from Stand, Bed to chair/wheelchair/BSC Sit to Stand: Min assist Stand pivot transfers: Min guard          General transfer comment: Min A to power to standing and to steady from EOB x3, SPT bed to [facility] Min guard assist and took steps to get to chair with HHA (MinA).    Ambulation/Gait Ambulation/Gait assistance: Min assist Gait Distance (Feet): 28 Feet (+ 14' + 14') Assistive device: 1 person hand held assist Gait Pattern/deviations: Step-through pattern, Decreased stride length   Gait velocity interpretation: <1.8 ft/sec, indicate of risk for recurrent falls   General Gait Details: Slow, unsteady gait with right knee instability, MIn HHA walking forwards and backwards. Standing and seated rest breaks needed 2 mins. Sp02 dropped to 84% at the lowest on first bout but then only dropped to 87% thereafter on 30 L HHFNC.   Stairs             Wheelchair Mobility    Modified Rankin (Stroke Patients Only)       Balance Overall balance assessment: Needs assistance Sitting-balance support: No upper extremity supported, Feet supported Sitting balance-Leahy Scale: Good     Standing balance support: During functional activity, Single extremity supported Standing balance-Leahy Scale: Poor Standing balance comment: unsteady with right knee pain requiring assist for balance                            Cognition Arousal/Alertness: Awake/alert Behavior During Therapy: WFL for tasks assessed/performed Overall Cognitive Status: Within Functional Limits for tasks assessed                                          Exercises  General Comments General comments (skin integrity, edema, etc.): Sp02 dropped to 84% at the lowest during activity on 30 L HHFNC.      Pertinent Vitals/Pain Pain Assessment Pain Assessment: No/denies pain    Home Living                          Prior Function            PT Goals (current goals can now be found in the care plan section) Progress towards PT goals: Progressing toward goals    Frequency    Min  3X/week      PT Plan Current plan remains appropriate    Co-evaluation              AM-PAC PT "6 Clicks" Mobility   Outcome Measure  Help needed turning from your back to your side while in a flat bed without using bedrails?: A Little Help needed moving from lying on your back to sitting on the side of a flat bed without using bedrails?: A Little Help needed moving to and from a bed to a chair (including a wheelchair)?: A Little Help needed standing up from a chair using your arms (e.g., wheelchair or bedside chair)?: A Little Help needed to walk in hospital room?: A Lot Help needed climbing 3-5 steps with a railing? : Total 6 Click Score: 15    End of Session Equipment Utilized During Treatment: Oxygen Activity Tolerance: Patient tolerated treatment well Patient left: in chair;with call bell/phone within reach;with chair alarm set Nurse Communication: Mobility status PT Visit Diagnosis: Other abnormalities of gait and mobility (R26.89);Difficulty in walking, not elsewhere classified (R26.2);Muscle weakness (generalized) (M62.81)     Time: 2202-5427 PT Time Calculation (min) (ACUTE ONLY): 28 min  Charges:  $Therapeutic Exercise: 8-22 mins $Therapeutic Activity: 8-22 mins                     Vale Haven, PT, DPT Acute Rehabilitation Services Secure chat preferred Office [phone]      Blake Divine A Saori Umholtz 02/19/2022, 2:25 PM

## 2022-02-19 NOTE — Progress Notes (Signed)
Patient in chair with RN in the room, patient complained of burning in IV from IV potassium.  RN slowed down rate of IV potassium and was hanging IV fluid to help with patient's discomfort when patient's heart rate dropped down to 39 and patient became unresponsive.  Patient transferred back to bed and sternal rub stimulation performed.  IV potassium paused during bradycardia episode.  Critical Care DO to bedside as DO on unit.  Patient regained alertness, patient never lost pulse.  Patient back to baseline, alert and oriented x4.  Patient stated she recalled feeling "faint" and "dizzy" prior to losing consciousness but was not aware she had lost consciousness.   IV potassium restarted at rate of 76ml/hr with normal saline in line at 50 ml/hr to assist with patient discomfort.  Patient stated that the discomfort from IV potassium was improved.  MD Elgergawy notified of patient episode and new orders received.

## 2022-02-19 NOTE — Progress Notes (Signed)
OT Cancellation Note  Patient Details Name: Cheryl Mora MRN: [id number] DOB: 04-13-1933   Cancelled Treatment:    Reason Eval/Treat Not Completed: Other (comment) (Pt just  returned to bed with nsg. Will attempt later time.)  [facility] 02/19/2022, 2:17 PM Luisa Dago, OT/L   Acute OT Clinical Specialist Acute Rehabilitation Services Pager [phone] Office [phone]

## 2022-02-19 NOTE — Progress Notes (Signed)
[facility] ADULT ICU REPLACEMENT PROTOCOL   The patient does apply for the [facility] Adult ICU Electrolyte Replacment Protocol based on the criteria listed below:   1.Exclusion criteria: TCTS patients, ECMO patients, and Dialysis patients 2. Is GFR >/= 30 ml/min? Yes.    Patient's GFR today is 54 3. Is SCr </= 2? Yes.   Patient's SCr is 1.01 mg/dL 4. Did SCr increase >/= 0.5 in 24 hours? No. 5.Pt's weight >40kg  Yes.   6. Abnormal electrolyte(s): k 3.1  7. Electrolytes replaced per protocol 8.  Call MD STAT for K+ </= 2.5, Phos </= 1, or Mag </= 1 Physician:    Darrick Grinder 02/19/2022 6:32 AM

## 2022-02-20 DIAGNOSIS — E119 Type 2 diabetes mellitus without complications: Secondary | ICD-10-CM | POA: Diagnosis not present

## 2022-02-20 DIAGNOSIS — I4819 Other persistent atrial fibrillation: Secondary | ICD-10-CM | POA: Diagnosis not present

## 2022-02-20 DIAGNOSIS — N179 Acute kidney failure, unspecified: Secondary | ICD-10-CM

## 2022-02-20 DIAGNOSIS — J9601 Acute respiratory failure with hypoxia: Secondary | ICD-10-CM | POA: Diagnosis not present

## 2022-02-20 LAB — CBC
HCT: 32.5 % — ABNORMAL LOW (ref 36.0–46.0)
Hemoglobin: 10.4 g/dL — ABNORMAL LOW (ref 12.0–15.0)
MCH: 29.8 pg (ref 26.0–34.0)
MCHC: 32 g/dL (ref 30.0–36.0)
MCV: 93.1 fL (ref 80.0–100.0)
Platelets: 173 10*3/uL (ref 150–400)
RBC: 3.49 MIL/uL — ABNORMAL LOW (ref 3.87–5.11)
RDW: 15.4 % (ref 11.5–15.5)
WBC: 9.6 10*3/uL (ref 4.0–10.5)
nRBC: 0 % (ref 0.0–0.2)

## 2022-02-20 LAB — BASIC METABOLIC PANEL
Anion gap: 10 (ref 5–15)
BUN: 15 mg/dL (ref 8–23)
CO2: 22 mmol/L (ref 22–32)
Calcium: 8.4 mg/dL — ABNORMAL LOW (ref 8.9–10.3)
Chloride: 107 mmol/L (ref 98–111)
Creatinine, Ser: 0.95 mg/dL (ref 0.44–1.00)
GFR, Estimated: 58 mL/min — ABNORMAL LOW (ref >60)
Glucose, Bld: 122 mg/dL — ABNORMAL HIGH (ref 70–99)
Potassium: 3.8 mmol/L (ref 3.5–5.1)
Sodium: 139 mmol/L (ref 135–145)

## 2022-02-20 LAB — GLUCOSE, CAPILLARY
Glucose-Capillary: 121 mg/dL — ABNORMAL HIGH (ref 70–99)
Glucose-Capillary: 170 mg/dL — ABNORMAL HIGH (ref 70–99)
Glucose-Capillary: 152 mg/dL — ABNORMAL HIGH (ref 70–99)
Glucose-Capillary: 161 mg/dL — ABNORMAL HIGH (ref 70–99)

## 2022-02-20 NOTE — Evaluation (Signed)
Occupational Therapy Evaluation Patient Details Name: Cheryl Mora MRN: [id number] DOB: 1932/07/23 Today's Date: 02/20/2022   History of Present Illness 88 yo female admitted 9/9 from [facility] ALF with N/V/D and PNA (suspect aspiration due to vomiting) PMHx: HTN, DM, paroxysmal Afib, hypothyroidism   Clinical Impression   Pt is independent at her baseline and lives in ILF at [facility]. Pt presents with decreased activity tolerance and desats to 84% on 20L 02 with short distance ambulation with RW. Educated in pursed lip breathing, pacing and importance of use of IS. Pt is needing set up to supervision for ADLs with an extended period of time to perform due to low endurance. Recommending SNF for ST rehab prior to returning home alone.      Recommendations for follow up therapy are one component of a multi-disciplinary discharge planning process, led by the attending physician.  Recommendations may be updated based on patient status, additional functional criteria and insurance authorization.   Follow Up Recommendations  Skilled nursing-short term rehab (<3 hours/day)    Assistance Recommended at Discharge Frequent or constant Supervision/Assistance  Patient can return home with the following A little help with walking and/or transfers;A little help with bathing/dressing/bathroom;Assistance with cooking/housework;Assist for transportation;Help with stairs or ramp for entrance    Functional Status Assessment  Patient has had a recent decline in their functional status and demonstrates the ability to make significant improvements in function in a reasonable and predictable amount of time.  Equipment Recommendations  None recommended by OT    Recommendations for Other Services       Precautions / Restrictions Precautions Precautions: Fall;Other (comment) Precaution Comments: watch sats Restrictions Weight Bearing Restrictions: No      Mobility Bed Mobility Overal bed mobility:  Independent             General bed mobility comments: HOB flat    Transfers Overall transfer level: Needs assistance Equipment used: Rolling walker (2 wheels) Transfers: Sit to/from Stand Sit to Stand: Supervision           General transfer comment: supervision for safety, pt prefers RW      Balance Overall balance assessment: Needs assistance Sitting-balance support: No upper extremity supported, Feet supported Sitting balance-Leahy Scale: Good     Standing balance support: During functional activity, Single extremity supported Standing balance-Leahy Scale: Fair                             ADL either performed or assessed with clinical judgement   ADL Overall ADL's : Needs assistance/impaired Eating/Feeding: Independent   Grooming: Set up;Sitting   Upper Body Bathing: Set up;Sitting   Lower Body Bathing: Supervison/ safety;Sit to/from stand   Upper Body Dressing : Set up;Sitting   Lower Body Dressing: Supervision/safety;Sit to/from stand   Toilet Transfer: Supervision/safety;Ambulation;Rolling walker (2 wheels)   Toileting- Clothing Manipulation and Hygiene: Supervision/safety;Sit to/from stand       Functional mobility during ADLs: Supervision/safety;Rolling walker (2 wheels) General ADL Comments: began educating pt energy conservation and pursed lip breathing     Vision Ability to See in Adequate Light: 0 Adequate Patient Visual Report: No change from baseline       Perception     Praxis      Pertinent Vitals/Pain Pain Assessment Pain Assessment: No/denies pain     Hand Dominance Right   Extremity/Trunk Assessment Upper Extremity Assessment Upper Extremity Assessment: Overall WFL for tasks assessed   Lower Extremity Assessment  Lower Extremity Assessment: Defer to PT evaluation   Cervical / Trunk Assessment Cervical / Trunk Assessment: Normal   Communication Communication Communication: No difficulties   Cognition  Arousal/Alertness: Awake/alert Behavior During Therapy: WFL for tasks assessed/performed Overall Cognitive Status: Within Functional Limits for tasks assessed                                       General Comments       Exercises     Shoulder Instructions      Home Living Family/patient expects to be discharged to:: Private residence Living Arrangements: Alone Available Help at Discharge: Other (Comment) (facility manages cooking and cleaning) Type of Home: Independent living facility Home Access: Elevator     Home Layout: One level     Bathroom Shower/Tub: Chief Strategy Officer: Handicapped height     Home Equipment: Cane - single Librarian, academic (2 wheels)   Additional Comments: pt is from [facility] ILF      Prior Functioning/Environment Prior Level of Function : Independent/Modified Independent;Driving             Mobility Comments: normally walks 5000steps /day without assist ADLs Comments: staff for cooking and cleaning        OT Problem List: Decreased activity tolerance;Impaired balance (sitting and/or standing);Cardiopulmonary status limiting activity      OT Treatment/Interventions: Self-care/ADL training;DME and/or AE instruction;Therapeutic activities;Patient/family education;Balance training    OT Goals(Current goals can be found in the care plan section) Acute Rehab OT Goals OT Goal Formulation: With patient Time For Goal Achievement: 03/06/22 Potential to Achieve Goals: Good ADL Goals Pt Will Perform Grooming: with modified independence;standing Pt Will Transfer to Toilet: with modified independence;ambulating;regular height toilet Pt Will Perform Toileting - Clothing Manipulation and hygiene: with modified independence;sit to/from stand Additional ADL Goal #1: Pt will utilized pursed lip breathing and energy conservation strategies during ADLs and mobility to keep 02 sats >90%.  OT Frequency: Min 2X/week     Co-evaluation              AM-PAC OT "6 Clicks" Daily Activity     Outcome Measure Help from another person eating meals?: None Help from another person taking care of personal grooming?: A Little Help from another person toileting, which includes using toliet, bedpan, or urinal?: A Little Help from another person bathing (including washing, rinsing, drying)?: A Little Help from another person to put on and taking off regular upper body clothing?: None Help from another person to put on and taking off regular lower body clothing?: A Little 6 Click Score: 20   End of Session Equipment Utilized During Treatment: Rolling walker (2 wheels);Oxygen (20L [facility])  Activity Tolerance: Patient tolerated treatment well Patient left: in bed;with call bell/phone within reach  OT Visit Diagnosis: Unsteadiness on feet (R26.81);Muscle weakness (generalized) (M62.81);Other (comment) (decreased activity tolerance)                Time: 1440-1505 OT Time Calculation (min): 25 min Charges:  OT General Charges $OT Visit: 1 Visit OT Evaluation $OT Eval Low Complexity: 1 Low OT Treatments $Self Care/Home Management : 8-22 mins  Berna Spare, OTR/L Acute Rehabilitation Services Office: [phone]   Evern Bio 02/20/2022, 3:36 PM

## 2022-02-20 NOTE — TOC Progression Note (Signed)
Transition of Care [facility]) - Progression Note    Patient Details  Name: Malyna Budney MRN: [id number] Date of Birth: 1932/11/13  Transition of Care [facility]) CM/SW Contact  Beckie Busing, RN Phone Number:[phone]  02/20/2022, 2:01 PM  Clinical Narrative:    Transition of Care [facility]) Screening Note   Patient Details  Name: Krystle Polcyn Date of Birth: 1932-11-02   Transition of Care [facility]) CM/SW Contact:    Beckie Busing, RN Phone Number: 02/20/2022, 2:01 PM    Transition of Care Department [facility]) has reviewed patient and no TOC needs have been identified at this time. We will continue to monitor patient advancement through interdisciplinary progression rounds. If new patient transition needs arise, please place a TOC consult.          Expected Discharge Plan and Services                                                 Social Determinants of Health (SDOH) Interventions    Readmission Risk Interventions     No data to display

## 2022-02-20 NOTE — Progress Notes (Signed)
  Progress Note   Patient: Cheryl Mora EBX:[id number] DOB: 03-06-1933 DOA: 02/16/2022     4 DOS: the patient was seen and examined on 02/20/2022   Brief hospital course:   Assessment and Plan: Acute Hypoxemic Respiratory Failure secondary to CAP, suspected Aspiration  --Continues to require heated high flow oxygen at 25 L/min but appears comfortable on this. --CT chest confirmed extensive bilateral lobe pneumonia L>R lower airspace disease, concerning for aspiration given N/V.  PE & dissection ruled out. On Eliquis at baseline, no missed doses.  -Given clinical improvement, will continue with IV Rocephin and azithromycin without adjustment.  If were to worsen would consider dedicated anaerobic coverage.. --Continue to wean down oxygen.  BiPAP as needed.   Paroxysmal Atrial Fibrillation  -Appears stable, currently in sinus rhythm on telemetry.  Continue home amiodarone, apixaban   Bradycardia/syncope --Syncopal event 9/12 thought to be secondary to potassium administration (vasovagal).  -- Monitor.   Essential hypertension --Appears stable.  Continue apixaban.   DM II appears to be diet controlled --CBG stable.  Continue sliding scale insulin.   Hypothyroidism --Continue levothyroxine.     Acute Kidney Injury superimposed on possible CKD stage IIIa --With baseline around 0.9, now resolved.  HHPT       Subjective:  Feels better, breathing better  Physical Exam: Vitals:   02/20/22 0800 02/20/22 0900 02/20/22 0922 02/20/22 1000  BP: (!) 119/59 (!) 120/54 (!) 120/54 127/64  Pulse: 60 (!) 59  (!) 57  Resp: (!) 23 (!) 24  (!) 24  Temp:      TempSrc:      SpO2: 95% 95%  99%  Weight:      Height:       Physical Exam Vitals reviewed.  Constitutional:      General: She is not in acute distress.    Appearance: She is not ill-appearing or toxic-appearing.  Cardiovascular:     Rate and Rhythm: Normal rate and regular rhythm.     Heart sounds: No murmur  heard. Pulmonary:     Effort: Pulmonary effort is normal. No respiratory distress.     Breath sounds: No wheezing.     Comments: Coarse breath sounds both bases Musculoskeletal:     Right lower leg: No edema.     Left lower leg: No edema.  Neurological:     Mental Status: She is alert.  Psychiatric:        Mood and Affect: Mood normal.        Behavior: Behavior normal.     Data Reviewed:  I/O balanced CBG stable BMP noted Hgb stable 10.4  Family Communication:   Disposition: Status is: Inpatient Remains inpatient appropriate because: high flow [place], hypoxic respiratory failur  Planned Discharge Destination:  TBD    Time spent: 35 minutes  Author: Brendia Sacks, MD 02/20/2022 10:41 AM  For on call review [email or web address].

## 2022-02-20 NOTE — Hospital Course (Signed)
Acute Hypoxemic Respiratory Failure secondary to CAP, suspected Aspiration  - CT chest confirms extensive bilateral lobe pneumonia L>R lower airspace disease, concerning for aspiration given N/V.   - PE & dissection ruled out. On Eliquis at baseline, no missed doses.  -Continue with IV Rocephin and azithromycin. -She was encouraged to use incentive spirometry and flutter valve today after they will be provided by the staff. -Wean oxygen as tolerated, some improvement of oxygen requirement today, she is down to 80%, 30 L heated high flow nasal cannula this morning . -She required BiPAP initially, continue with BiPAP as needed for increased work of breathing -Urine strep pneumonia antigen negative -Patient with pneumonia most likely due to vomiting episode, no recurrence. -He was encouraged to use incentive spirometry and flutter valve   Paroxysmal Atrial Fibrillation  -continue home amiodarone, eliquis    Bradycardia/syncope -Cardiac and syncopal event this morning during pain over the IV site from [place]-L, most likely vasovagal, continue with telemetry monitoring, potassium is low we will correct as well, will check magnesium level and correct if needed -will Check EKG, and continue with amiodarone as long QTc is appropriate   History of hypertension -continue home norvasc -BP monitoring    DM II  -SSI, moderate scale    Hypothyroidism -synthroid       Acute Kidney Injury  -hold diuresis with contrast  -encourage PO intake    HHPT  Patient in chair with RN in the room, patient complained of burning in IV from IV potassium.  RN slowed down rate of IV potassium and was hanging IV fluid to help with patient's discomfort when patient's heart rate dropped down to 39 and patient became unresponsive.  Patient transferred back to bed and sternal rub stimulation performed.  IV potassium paused during bradycardia episode.  Critical Care DO to bedside as DO on unit.  Patient regained  alertness, patient never lost pulse.  Patient back to baseline, alert and oriented x4.  Patient stated she recalled feeling "faint" and "dizzy" prior to losing consciousness but was not aware she had lost consciousness.   IV potassium restarted at rate of 44ml/hr with normal saline in line at 50 ml/hr to assist with patient discomfort.  Patient stated that the discomfort from IV potassium was improved.  MD Elgergawy notified of patient episode and new orders received.

## 2022-02-21 DIAGNOSIS — J690 Pneumonitis due to inhalation of food and vomit: Secondary | ICD-10-CM | POA: Diagnosis not present

## 2022-02-21 DIAGNOSIS — E119 Type 2 diabetes mellitus without complications: Secondary | ICD-10-CM | POA: Diagnosis not present

## 2022-02-21 DIAGNOSIS — I4819 Other persistent atrial fibrillation: Secondary | ICD-10-CM | POA: Diagnosis not present

## 2022-02-21 DIAGNOSIS — J9601 Acute respiratory failure with hypoxia: Secondary | ICD-10-CM | POA: Diagnosis not present

## 2022-02-21 LAB — GLUCOSE, CAPILLARY
Glucose-Capillary: 105 mg/dL — ABNORMAL HIGH (ref 70–99)
Glucose-Capillary: 180 mg/dL — ABNORMAL HIGH (ref 70–99)
Glucose-Capillary: 177 mg/dL — ABNORMAL HIGH (ref 70–99)
Glucose-Capillary: 151 mg/dL — ABNORMAL HIGH (ref 70–99)

## 2022-02-21 LAB — CULTURE, BLOOD (ROUTINE X 2)
Culture: NO GROWTH
Culture: NO GROWTH
Special Requests: ADEQUATE

## 2022-02-21 NOTE — Progress Notes (Signed)
Physical Therapy Treatment Patient Details Name: Cheryl Mora MRN: [id number] DOB: 1932-07-22 Today's Date: 02/21/2022   History of Present Illness 88 yo female admitted 9/9 from [facility] ALF with N/V/D and PNA (suspect aspiration due to vomiting) PMHx: HTN, DM, paroxysmal Afib, hypothyroidism    PT Comments    Making good progress toward goals.  Emphasis on strengthening exercise for U and LE's and progression of gait stability/stamina with RW, min guard and 8L [place].    Recommendations for follow up therapy are one component of a multi-disciplinary discharge planning process, led by the attending physician.  Recommendations may be updated based on patient status, additional functional criteria and insurance authorization.  Follow Up Recommendations  Home health PT     Assistance Recommended at Discharge Intermittent Supervision/Assistance  Patient can return home with the following A little help with bathing/dressing/bathroom;Assistance with cooking/housework;A little help with walking and/or transfers   Equipment Recommendations  Rolling walker (2 wheels)    Recommendations for Other Services       Precautions / Restrictions Precautions Precautions: Fall Precaution Comments: watch sats     Mobility  Bed Mobility Overal bed mobility: Independent             General bed mobility comments: HOB flat    Transfers Overall transfer level: Needs assistance Equipment used: None Transfers: Sit to/from Stand Sit to Stand: Supervision           General transfer comment: cues for hand placement    Ambulation/Gait Ambulation/Gait assistance: Min guard Gait Distance (Feet): 90 Feet (then additional 75 feet after sitting rest) Assistive device: Rolling walker (2 wheels) Gait Pattern/deviations: Step-through pattern   Gait velocity interpretation: <1.8 ft/sec, indicate of risk for recurrent falls   General Gait Details: generally steady, slow cadence, reports  weak knees with mild fatige.  Sats on 8L [place] dipping down to 85-88% (suspect PLETH)with quick rebound at rest.  Pt responding well with good color throughout.   Stairs             Wheelchair Mobility    Modified Rankin (Stroke Patients Only)       Balance     Sitting balance-Leahy Scale: Good       Standing balance-Leahy Scale: Fair                              Cognition Arousal/Alertness: Awake/alert Behavior During Therapy: WFL for tasks assessed/performed Overall Cognitive Status: Within Functional Limits for tasks assessed                                          Exercises General Exercises - Lower Extremity Ankle Circles/Pumps: AROM, 20 reps, Supine Heel Slides: AROM, Strengthening, Both, 10 reps, Supine (graded resistance) Straight Leg Raises: AROM, Strengthening, Both, 10 reps, Supine Other Exercises Other Exercises: bicep/tricep presses bil x 10 reps with graded resistance.    General Comments        Pertinent Vitals/Pain Pain Assessment Pain Assessment: No/denies pain    Home Living                          Prior Function            PT Goals (current goals can now be found in the care plan section) Acute Rehab PT Goals Patient  Stated Goal: start swimming, walking PT Goal Formulation: With patient Time For Goal Achievement: 03/04/22 Potential to Achieve Goals: Good Progress towards PT goals: Progressing toward goals    Frequency    Min 3X/week      PT Plan Current plan remains appropriate    Co-evaluation              AM-PAC PT "6 Clicks" Mobility   Outcome Measure  Help needed turning from your back to your side while in a flat bed without using bedrails?: A Little Help needed moving from lying on your back to sitting on the side of a flat bed without using bedrails?: A Little Help needed moving to and from a bed to a chair (including a wheelchair)?: A Little Help needed standing  up from a chair using your arms (e.g., wheelchair or bedside chair)?: A Little Help needed to walk in hospital room?: A Little Help needed climbing 3-5 steps with a railing? : A Little 6 Click Score: 18    End of Session Equipment Utilized During Treatment: Oxygen Activity Tolerance: Patient tolerated treatment well Patient left: in chair;with call bell/phone within reach;with chair alarm set Nurse Communication: Mobility status PT Visit Diagnosis: Other abnormalities of gait and mobility (R26.89);Difficulty in walking, not elsewhere classified (R26.2);Muscle weakness (generalized) (M62.81)     Time: 1132-1206 PT Time Calculation (min) (ACUTE ONLY): 34 min  Charges:  $Gait Training: 8-22 mins $Therapeutic Exercise: 8-22 mins                     02/21/2022  Jacinto Halim., PT Acute Rehabilitation Services [phone]  (office)   Eliseo Gum Tedra Coppernoll 02/21/2022, 12:22 PM

## 2022-02-21 NOTE — Progress Notes (Signed)
Speech Language Pathology Treatment: Dysphagia  Patient Details Name: Cheryl Mora MRN: [id number] DOB: 1932/07/25 Today's Date: 02/21/2022 Time: 6184-8592 SLP Time Calculation (min) (ACUTE ONLY): 19 min  Assessment / Plan / Recommendation Clinical Impression  Pt was seen for f/u, and both she and RN report no overt difficulties with PO intake. She has been able to wean off [place], currently down to 8L. She thinks her voice is improving but not back to baseline. We discussed strategies for vocal rest and ongoing monitoring. Pt did have coughing in response to what she felt to be a "crumb" from her graham cracker, but with additional trials, she did not have additional immediate coughing or overt signs of dysphagia. Aspiration precautions and swallowing strategies in the setting of respiratory decline also reviewed. Pt verbalized her understanding and does not think she needs any further testing at this point. Will leave on regular solids and thin liquids. SLP to s/o acutely for now.   HPI HPI: Pt is an 88 yo female presenting from ALF with N/V/D followed by shortness of breath. CT Chest shows L > R airspace disease concerning for aspiration. PMH includes: afib, DM, HTN, hypothyroidism      SLP Plan  All goals met      Recommendations for follow up therapy are one component of a multi-disciplinary discharge planning process, led by the attending physician.  Recommendations may be updated based on patient status, additional functional criteria and insurance authorization.    Recommendations  Diet recommendations: Regular;Thin liquid Liquids provided via: Cup;Straw Medication Administration: Whole meds with puree Supervision: Patient able to self feed;Intermittent supervision to cue for compensatory strategies Compensations: Slow rate;Small sips/bites Postural Changes and/or Swallow Maneuvers: Seated upright 90 degrees                Oral Care Recommendations: Oral care BID Follow  Up Recommendations: No SLP follow up Assistance recommended at discharge: PRN SLP Visit Diagnosis: Dysphagia, unspecified (R13.10) Plan: All goals met           Osie Bond., M.A. [place] Office [phone]  Secure chat preferred   02/21/2022, 10:34 AM

## 2022-02-21 NOTE — Progress Notes (Addendum)
  Progress Note   Patient: Cheryl Mora DPO:[id number] DOB: 09/08/1932 DOA: 02/16/2022     5 DOS: the patient was seen and examined on 02/21/2022   Brief hospital course:  Assessment and Plan: Acute hypoxemic respiratory failure secondary to aspiration pneumonia  --Improving, down to 8 L, off heated high flow --CT chest confirmed extensive bilateral lobe pneumonia L>R lower airspace disease, concerning for aspiration given N/V.  PE & dissection ruled out. On Eliquis at baseline, no missed doses.  -Given clinical improvement, will continue with IV Rocephin and azithromycin without adjustment.  If were to worsen would consider dedicated anaerobic coverage.. --Continue to wean down oxygen.  BiPAP as needed.   Paroxysmal Atrial Fibrillation  -Appears stable, in sinus rhythm on telemetry.  Continue home amiodarone, apixaban   Bradycardia/syncope --Syncopal event 9/12 thought to be secondary to potassium administration (vasovagal).  -- No further evaluation suggested discharge.   Essential hypertension --stable.  Continue apixaban.   DM II appears to be diet controlled --CBG stable.  Continue sliding scale insulin.   Hypothyroidism --Continue levothyroxine.    Acute Kidney Injury superimposed on possible CKD stage IIIa --With baseline around 0.9, now resolved.   HHPT  Overall improving.  Continue to wean oxygen.     Subjective:  Feels better Breathing better Eating fine  Physical Exam: Vitals:   02/21/22 0600 02/21/22 0743 02/21/22 0800 02/21/22 0900  BP: (!) 149/65  (!) 145/63 109/83  Pulse: 63 62 61 66  Resp: (!) 21 20 (!) 23 (!) 25  Temp:   97.8 F (36.6 C)   TempSrc:   Oral   SpO2: 94% 98% 100% 92%  Weight:      Height:       Physical Exam Vitals reviewed.  Constitutional:      General: She is not in acute distress.    Appearance: She is not ill-appearing or toxic-appearing.  Cardiovascular:     Rate and Rhythm: Normal rate and regular rhythm.     Heart  sounds: No murmur heard. Pulmonary:     Effort: Pulmonary effort is normal. No respiratory distress.     Breath sounds: No wheezing, rhonchi or rales.  Neurological:     Mental Status: She is alert.  Psychiatric:        Mood and Affect: Mood normal.        Behavior: Behavior normal.     Data Reviewed:  CBG stable  Family Communication:   Disposition: Status is: Inpatient Remains inpatient appropriate because: hypoxic respiratory failure  Planned Discharge Destination: Home with Home Health    Time spent: 20 minutes  Author: Brendia Sacks, MD 02/21/2022 10:45 AM  For on call review [email or web address].

## 2022-02-21 NOTE — TOC Initial Note (Signed)
Transition of Care [facility]) - Initial/Assessment Note    Patient Details  Name: Cheryl Mora MRN: [id number] Date of Birth: 1933/01/31  Transition of Care [facility]) CM/SW Contact:    Baldemar Lenis, LCSW Phone Number: 02/21/2022, 11:50 AM  Clinical Narrative:               Patient from [facility] ID[facility], plans to admit to SNF at discharge. CSW spoke with [facility] to confirm that patient will have SNF bed when stable. CSW to follow.    Expected Discharge Plan: Skilled Nursing Facility Barriers to Discharge: Continued Medical Work up, English as a second language teacher   Patient Goals and CMS Choice Patient states their goals for this hospitalization and ongoing recovery are:: to start swimming again CMS Medicare.gov Compare Post Acute Care list provided to:: Patient Choice offered to / list presented to : Patient  Expected Discharge Plan and Services Expected Discharge Plan: Skilled Nursing Facility     Post Acute Care Choice: Skilled Nursing Facility Living arrangements for the past 2 months: Independent Living Facility                                      Prior Living Arrangements/Services Living arrangements for the past 2 months: Independent Living Facility Lives with:: Facility Resident Patient language and need for interpreter reviewed:: No Do you feel safe going back to the place where you live?: Yes      Need for Family Participation in Patient Care: No (Comment) Care giver support system in place?: Yes (comment)   Criminal Activity/Legal Involvement Pertinent to Current Situation/Hospitalization: No - Comment as needed  Activities of Daily Living      Permission Sought/Granted Permission sought to share information with : Facility Industrial/product designer granted to share information with : Yes, Verbal Permission Granted     Permission granted to share info w AGENCY: [facility]        Emotional Assessment Appearance:: Appears stated  age Attitude/Demeanor/Rapport: Engaged Affect (typically observed): Appropriate Orientation: : Oriented to Self, Oriented to Place, Oriented to  Time, Oriented to Situation Alcohol / Substance Use: Not Applicable Psych Involvement: No (comment)  Admission diagnosis:  Acute respiratory failure with hypoxia (HCC) [J96.01] Acute hypoxemic respiratory failure (HCC) [J96.01] Patient Active Problem List   Diagnosis Date Noted   AKI (acute kidney injury) (HCC) 02/20/2022   Acute hypoxemic respiratory failure (HCC) 02/16/2022   Persistent atrial fibrillation (HCC)    Acute systolic heart failure (HCC) 03/07/2020   Dyspnea on exertion 03/06/2020   Hyperlipidemia 03/06/2020   Type 2 diabetes mellitus without complication, without long-term current use of insulin (HCC) 03/06/2020   Benign essential HTN 03/06/2020   NICM (nonischemic cardiomyopathy) (HCC) 03/06/2020   Atrial fibrillation with RVR (HCC) 03/06/2020   PCP:  Eartha Inch, MD Pharmacy:   [facility] [phone] Ginette Otto,  - [address] [place] [address] Phone: [phone] Fax: [phone]  [facility] - [place], [place] - [address] [place] [address] Phone: [phone] Fax: [phone]     Social Determinants of Health (SDOH) Interventions    Readmission Risk Interventions     No data to display

## 2022-02-22 DIAGNOSIS — J690 Pneumonitis due to inhalation of food and vomit: Secondary | ICD-10-CM

## 2022-02-22 DIAGNOSIS — I480 Paroxysmal atrial fibrillation: Secondary | ICD-10-CM

## 2022-02-22 DIAGNOSIS — E119 Type 2 diabetes mellitus without complications: Secondary | ICD-10-CM | POA: Diagnosis not present

## 2022-02-22 DIAGNOSIS — J9601 Acute respiratory failure with hypoxia: Secondary | ICD-10-CM | POA: Diagnosis not present

## 2022-02-22 LAB — URINALYSIS, ROUTINE W REFLEX MICROSCOPIC
Bilirubin Urine: NEGATIVE
Glucose, UA: NEGATIVE mg/dL
Hgb urine dipstick: NEGATIVE
Ketones, ur: NEGATIVE mg/dL
Leukocytes,Ua: NEGATIVE
Nitrite: NEGATIVE
Protein, ur: NEGATIVE mg/dL
Specific Gravity, Urine: 1.012 (ref 1.005–1.030)
pH: 5 (ref 5.0–8.0)

## 2022-02-22 LAB — GLUCOSE, CAPILLARY
Glucose-Capillary: 120 mg/dL — ABNORMAL HIGH (ref 70–99)
Glucose-Capillary: 202 mg/dL — ABNORMAL HIGH (ref 70–99)
Glucose-Capillary: 103 mg/dL — ABNORMAL HIGH (ref 70–99)
Glucose-Capillary: 197 mg/dL — ABNORMAL HIGH (ref 70–99)

## 2022-02-22 MED ORDER — INSULIN ASPART 100 UNIT/ML IJ SOLN
0.0000 [IU] | Freq: Three times a day (TID) | INTRAMUSCULAR | Status: DC
  Administered 2022-02-23: 2 [IU] via SUBCUTANEOUS
  Administered 2022-02-23 – 2022-02-24 (×2): 3 [IU] via SUBCUTANEOUS
  Administered 2022-02-24 (×2): 2 [IU] via SUBCUTANEOUS
  Administered 2022-02-24: 5 [IU] via SUBCUTANEOUS
  Administered 2022-02-25: 3 [IU] via SUBCUTANEOUS

## 2022-02-22 MED ORDER — SODIUM CHLORIDE 0.9 % IV SOLN: INTRAVENOUS | Status: DC | PRN

## 2022-02-22 NOTE — Plan of Care (Signed)
  Problem: Clinical Measurements: Goal: Respiratory complications will improve Outcome: Progressing   Problem: Activity: Goal: Risk for activity intolerance will decrease Outcome: Progressing   Problem: Safety: Goal: Ability to remain free from injury will improve Outcome: Progressing   

## 2022-02-22 NOTE — Progress Notes (Addendum)
So far, able to wean patient down to 6L HFNC - 97% saturations.

## 2022-02-22 NOTE — Progress Notes (Signed)
  Progress Note   Patient: Cheryl Mora FBP:[id number] DOB: 1933/04/13 DOA: 02/16/2022     6 DOS: the patient was seen and examined on 02/22/2022   Brief hospital course:  Assessment and Plan: Acute hypoxemic respiratory failure secondary to aspiration pneumonia  --CT chest confirmed extensive bilateral lobe pneumonia L>R lower airspace disease, concerning for aspiration given N/V.  PE & dissection ruled out. On Eliquis at baseline, no missed doses.  -Given clinical improvement, will continue with IV Rocephin and azithromycin without adjustment.  If were to worsen would consider dedicated anaerobic coverage.. --Improving, down to 7 L, off heated high flow. Continue to wean down oxygen.  BiPAP as needed.   Paroxysmal Atrial Fibrillation  --Stable, in sinus rhythm on telemetry.  Continue home amiodarone, apixaban   Essential hypertension --stable.  Continue apixaban.   DM II appears to be diet controlled --CBG stable.  Continue sliding scale insulin.   Hypothyroidism --Continue levothyroxine.    Acute Kidney Injury superimposed on possible CKD stage IIIa --With baseline around 0.9, now resolved.   Bradycardia/syncope --Syncopal event 9/12 thought to be secondary to potassium administration (vasovagal).  -- No further evaluation suggested discharge.   Overall improving.  Continue to wean oxygen.      Subjective:  Feels better, breathing better  Physical Exam: Vitals:   02/22/22 0827 02/22/22 1114 02/22/22 1225 02/22/22 1518  BP: (!) 102/53 139/61 (!) 115/53 (!) 135/51  Pulse: 70 63 67 61  Resp: 18 20 18 20   Temp: 98.2 F (36.8 C) 97.8 F (36.6 C) 97.9 F (36.6 C) 98 F (36.7 C)  TempSrc: Oral Oral Oral Oral  SpO2:  97%  98%  Weight:      Height:       Physical Exam Vitals reviewed.  Constitutional:      General: She is not in acute distress.    Appearance: She is not ill-appearing or toxic-appearing.  Cardiovascular:     Rate and Rhythm: Normal rate and  regular rhythm.     Heart sounds: No murmur heard.    Comments: Telemetry SR Pulmonary:     Effort: Pulmonary effort is normal. No respiratory distress.     Breath sounds: No wheezing, rhonchi or rales (bilateral bases posteriorly).  Neurological:     Mental Status: She is alert.  Psychiatric:        Mood and Affect: Mood normal.        Behavior: Behavior normal.     Data Reviewed:  CBG stable  Family Communication: none  Disposition: Status is: Inpatient Remains inpatient appropriate because: acute hypoxia, pneumonia on IV abx  Planned Discharge Destination: Home health PT    Time spent: 20 minutes  Author: , MD 02/22/2022 5:30 PM  For on call review [email or web address].

## 2022-02-23 DIAGNOSIS — I480 Paroxysmal atrial fibrillation: Secondary | ICD-10-CM | POA: Diagnosis not present

## 2022-02-23 DIAGNOSIS — E119 Type 2 diabetes mellitus without complications: Secondary | ICD-10-CM | POA: Diagnosis not present

## 2022-02-23 DIAGNOSIS — J690 Pneumonitis due to inhalation of food and vomit: Secondary | ICD-10-CM | POA: Diagnosis not present

## 2022-02-23 DIAGNOSIS — J9601 Acute respiratory failure with hypoxia: Secondary | ICD-10-CM | POA: Diagnosis not present

## 2022-02-23 LAB — GLUCOSE, CAPILLARY
Glucose-Capillary: 103 mg/dL — ABNORMAL HIGH (ref 70–99)
Glucose-Capillary: 198 mg/dL — ABNORMAL HIGH (ref 70–99)
Glucose-Capillary: 140 mg/dL — ABNORMAL HIGH (ref 70–99)
Glucose-Capillary: 133 mg/dL — ABNORMAL HIGH (ref 70–99)

## 2022-02-23 NOTE — Progress Notes (Signed)
Able to wean down pt down do 3 L HFNC at 95 % saturation

## 2022-02-23 NOTE — Progress Notes (Signed)
VS stable no BIPAP needed at this time.

## 2022-02-23 NOTE — Progress Notes (Signed)
  Progress Note   Patient: Cheryl Mora TLX:[id number] DOB: [birth year] DOA: 02/16/2022     7 DOS: the patient was seen and examined on 02/23/2022   Brief hospital course:  Assessment and Plan: Acute hypoxemic respiratory failure secondary to aspiration pneumonia  --CT chest confirmed extensive bilateral lobe pneumonia L>R lower airspace disease, concerning for aspiration given N/V.  PE & dissection ruled out. On Eliquis at baseline, no missed doses.  -Given clinical improvement, will continue with IV Rocephin and azithromycin without adjustment.  If were to worsen would consider dedicated anaerobic coverage.. --Improving, down to 3 L HF, off heated high flow. Continue to wean down oxygen.     Paroxysmal Atrial Fibrillation  --Stable, in sinus rhythm on telemetry.  Continue home amiodarone, apixaban   Essential hypertension --stable.  Continue apixaban.   DM II appears to be diet controlled --CBG remains stable.  Continue sliding scale insulin.   Hypothyroidism --Continue levothyroxine.    Acute Kidney Injury superimposed on possible CKD stage IIIa --With baseline around 0.9, resolved.   Bradycardia/syncope --Syncopal event 9/12 thought to be secondary to potassium administration (vasovagal).  -- No further evaluation suggested discharge.   Overall improving.  Continue to wean oxygen. SNF Monday.      Subjective:  Feels better Breathing better Ambulating Eating well   Physical Exam: Vitals:   02/22/22 2322 02/23/22 0446 02/23/22 0731 02/23/22 1230  BP:  (!) 113/59 (!) 106/57   Pulse:  61 69   Resp: 16 17 (!) 21   Temp:  (!) 97.4 F (36.3 C) 97.6 F (36.4 C)   TempSrc:  Oral Oral   SpO2: 93%  92% 95%  Weight:  63 kg    Height:       Physical Exam Vitals reviewed.  Constitutional:      General: She is not in acute distress.    Appearance: She is not ill-appearing or toxic-appearing.  Cardiovascular:     Rate and Rhythm: Normal rate and regular rhythm.      Heart sounds: No murmur heard. Pulmonary:     Effort: Pulmonary effort is normal. No respiratory distress.     Breath sounds: No wheezing, rhonchi or rales.  Neurological:     Mental Status: She is alert.  Psychiatric:        Mood and Affect: Mood normal.        Behavior: Behavior normal.     Data Reviewed:  CBG stable  Family Communication: none  Disposition: Status is: Inpatient Remains inpatient appropriate because: hypoxia, plan for SNF  Planned Discharge Destination: Skilled nursing facility    Time spent: 20 minutes  Author: Murray Hodgkins, MD 02/23/2022 2:55 PM  For on call review [email or web address].

## 2022-02-24 DIAGNOSIS — E119 Type 2 diabetes mellitus without complications: Secondary | ICD-10-CM | POA: Diagnosis not present

## 2022-02-24 DIAGNOSIS — J690 Pneumonitis due to inhalation of food and vomit: Secondary | ICD-10-CM | POA: Diagnosis not present

## 2022-02-24 DIAGNOSIS — I480 Paroxysmal atrial fibrillation: Secondary | ICD-10-CM | POA: Diagnosis not present

## 2022-02-24 DIAGNOSIS — J9601 Acute respiratory failure with hypoxia: Secondary | ICD-10-CM | POA: Diagnosis not present

## 2022-02-24 LAB — GLUCOSE, CAPILLARY
Glucose-Capillary: 130 mg/dL — ABNORMAL HIGH (ref 70–99)
Glucose-Capillary: 163 mg/dL — ABNORMAL HIGH (ref 70–99)
Glucose-Capillary: 143 mg/dL — ABNORMAL HIGH (ref 70–99)
Glucose-Capillary: 232 mg/dL — ABNORMAL HIGH (ref 70–99)

## 2022-02-24 NOTE — Progress Notes (Signed)
  Progress Note   Patient: Cheryl Mora [id number]:[id number] DOB: 20-May-1933 DOA: 02/16/2022     8 DOS: the patient was seen and examined on 02/24/2022   Brief hospital course:  Assessment and Plan: Acute hypoxemic respiratory failure secondary to aspiration pneumonia  --CT chest confirmed extensive bilateral lobe pneumonia L>R lower airspace disease, concerning for aspiration given N/V.  PE & dissection ruled out. On Eliquis at baseline, no missed doses.  -Given clinical improvement, will continue with IV Rocephin and azithromycin without adjustment.  If were to worsen would consider dedicated anaerobic coverage.. --Improving, stable on 3 L HF, off heated high flow. Continue to wean down oxygen.     Paroxysmal Atrial Fibrillation  --Stable, in sinus brady on telemetry.  Continue home amiodarone, apixaban. Stop telemetry.   Essential hypertension --remains stable.  Continue apixaban.   DM II appears to be diet controlled --CBG remains stable.  Continue sliding scale insulin.   Hypothyroidism --Continue levothyroxine.    Acute Kidney Injury superimposed on possible CKD stage IIIa --With baseline around 0.9, resolved.   Bradycardia/syncope --Syncopal event 9/12 thought to be secondary to potassium administration (vasovagal).  -- No further evaluation suggested discharge.   Overall improving.  Continue to wean oxygen. SNF Monday.      Subjective:  Feels fine  Physical Exam: Vitals:   02/24/22 0625 02/24/22 0725 02/24/22 1109 02/24/22 1611  BP: (!) 117/56 (!) 112/54 (!) 96/55 (!) 126/54  Pulse:  70 60 62  Resp:  17 (!) 21 18  Temp: 98.2 F (36.8 C) 98 F (36.7 C) 98.2 F (36.8 C)   TempSrc: Oral Oral Oral   SpO2: 95% 92% 93% 96%  Weight: 62.7 kg     Height:       Physical Exam Vitals reviewed.  Constitutional:      General: She is not in acute distress.    Appearance: She is ill-appearing. She is not toxic-appearing.  Cardiovascular:     Rate and Rhythm: Normal rate  and regular rhythm.     Heart sounds: No murmur heard. Pulmonary:     Effort: Pulmonary effort is normal. No respiratory distress.     Breath sounds: No wheezing or rhonchi.     Comments: Mild crackles bilateral posterior bases Neurological:     Mental Status: She is alert.  Psychiatric:        Mood and Affect: Mood normal.        Behavior: Behavior normal.     Data Reviewed:  CBG stable  Family Communication: none  Disposition: Status is: Inpatient Remains inpatient appropriate because: hypoxia, plan for SNF  Planned Discharge Destination: Skilled nursing facility    Time spent: 20 minutes  Author: Murray Hodgkins, MD 02/24/2022 4:53 PM  For on call review [email or web address].

## 2022-02-24 NOTE — Progress Notes (Signed)
SATURATION QUALIFICATIONS: (This note is used to comply with regulatory documentation for home oxygen)  Patient Saturations on Room Air at Rest = 88%  Patient Saturations on Room Air while Ambulating = 80%  Patient Saturations on 3 Liters of oxygen while Ambulating = 89%

## 2022-02-24 NOTE — Progress Notes (Signed)
Mobility Specialist Progress Note    02/24/22 1642  Mobility  Activity Ambulated with assistance in hallway  Level of Assistance Contact guard assist, steadying assist  Assistive Device Front wheel walker  Distance Ambulated (ft) 400 ft  Activity Response Tolerated well  $Mobility charge 1 Mobility   During Mobility: 89% SpO2 on 3LO2 Post-Mobility: 68 HR, 89% SpO2 on 2LO2  Pt received in chair and agreeable. No complaints on walk. SpO2 88-90% on 3LO2 during. Returned to chair with call bell in reach.    Hildred Alamin Mobility Specialist

## 2022-02-25 DIAGNOSIS — J690 Pneumonitis due to inhalation of food and vomit: Secondary | ICD-10-CM | POA: Diagnosis not present

## 2022-02-25 DIAGNOSIS — I480 Paroxysmal atrial fibrillation: Secondary | ICD-10-CM | POA: Diagnosis not present

## 2022-02-25 DIAGNOSIS — J9601 Acute respiratory failure with hypoxia: Secondary | ICD-10-CM | POA: Diagnosis not present

## 2022-02-25 DIAGNOSIS — E119 Type 2 diabetes mellitus without complications: Secondary | ICD-10-CM | POA: Diagnosis not present

## 2022-02-25 LAB — GLUCOSE, CAPILLARY
Glucose-Capillary: 118 mg/dL — ABNORMAL HIGH (ref 70–99)
Glucose-Capillary: 141 mg/dL — ABNORMAL HIGH (ref 70–99)

## 2022-02-25 NOTE — Discharge Summary (Signed)
Physician Discharge Summary   Patient: Cheryl Mora MRN: [id number] DOB: 1932-10-21  Admit date:     02/16/2022  Discharge date: 02/25/22  Discharge Physician: Murray Hodgkins   PCP: [name], MD   Recommendations at discharge:  Acute hypoxemic respiratory failure secondary to aspiration pneumonia  Continues to improve clinically, now down to 2 L nasal cannula.  Wean oxygen as tolerated in the outpatient setting.   Paroxysmal Atrial Fibrillation  --Weight is borderline for apixaban adjustment (62kg vs < 60 + age > 26). Could consider decreasing apixaban to 2.5mg  if fall risk is notable. Will defer to PCP/cardiology.  Discharge Diagnoses: Principal Problem:   Acute hypoxemic respiratory failure (HCC) Active Problems:   Hyperlipidemia   Type 2 diabetes mellitus without complication, without long-term current use of insulin (HCC)   Benign essential HTN   Atrial fibrillation with RVR (HCC)   Persistent atrial fibrillation (HCC)   AKI (acute kidney injury) (HCC)   Aspiration pneumonia (HCC)   PAF (paroxysmal atrial fibrillation) [facility], [place])  Hospital Course: 88 year old woman presented with nausea vomiting and diarrhea, acute hypoxic respiratory failure requiring BiPAP in the emergency department, was admitted by critical care to the ICU for hypoxic respiratory failure with extensive aspiration pneumonia requiring BiPAP support. --9/9 admit to ICU per PCCM for acute hypoxic respiratory failure secondary to aspiration pneumonia. -- 9/10 transferred to hospitalist service, weaned to heated high flow -- 9/11-9/18 gradual clinical improvement, successfully weaned down to 2 L.  Plan for SNF.  Acute hypoxemic respiratory failure secondary to aspiration pneumonia  --CT chest confirmed extensive bilateral lobe pneumonia L>R lower airspace disease, concerning for aspiration given N/V.  PE & dissection ruled out. On Eliquis at baseline, no missed doses.  --Treated with full course of  antibiotics.  Continues to improve clinically, now down to 2 L nasal cannula.  Can transition to the outpatient setting, wean oxygen as tolerated.   Paroxysmal Atrial Fibrillation  --Stable, in sinus brady on telemetry.  Continue home amiodarone, apixaban.  --Weight is borderline for apixaban adjustment (62kg vs < 60 + age > 65). Could consider decreasing apixaban to 2.5mg  if fall risk is notable. Will defer to PCP.  Essential hypertension --remains stable.     DM II appears to be diet controlled --CBG remains stable.  Continue sliding scale insulin.   Hypothyroidism --Continue levothyroxine.    Acute Kidney Injury superimposed on possible CKD stage IIIa --With baseline around 0.9, resolved.   Bradycardia/syncope --Syncopal event 9/12 thought to be secondary to potassium administration (vasovagal).  -- No further evaluation suggested discharge.     Consultants:  Admitted by PCCM  Procedures performed:  None   Disposition: Skilled nursing facility Diet recommendation:  Diet Orders (From admission, onward)     Start     Ordered   02/25/22 0000  Diet - low sodium heart healthy        02/25/22 1124   02/16/22 1843  Diet regular Room service appropriate? Yes; Fluid consistency: Thin  Diet effective now       Question Answer Comment  Room service appropriate? Yes   Fluid consistency: Thin      02/16/22 1844            DISCHARGE MEDICATION: Allergies as of 02/25/2022       Reactions   Amoxicillin-pot Clavulanate Diarrhea, Other (See Comments)   Drowsy    Brimonidine Other (See Comments)   Conj follicles and injection   Lisinopril Swelling, Other (See Comments)  Angioedema welling lips, throat closing    Brimonidine Tartrate-timolol Other (See Comments)   Redness in eye   Zolpidem Swelling   Lips swell        Medication List     STOP taking these medications    diphenhydrAMINE 25 MG tablet Commonly known as: BENADRYL       TAKE these medications     amiodarone 200 MG tablet Commonly known as: PACERONE TAKE 1 TABLET EVERY DAY   amLODipine 5 MG tablet Commonly known as: NORVASC Take 1 tablet (5 mg total) by mouth daily.   apixaban 5 MG Tabs tablet Commonly known as: Eliquis TAKE 1 TABLET (5MG  TOTAL) TWICE DAILY. What changed:  how much to take how to take this when to take this additional instructions   ascorbic acid 250 MG Chew Commonly known as: VITAMIN C Chew 250 mg by mouth 2 (two) times daily.   calcium carbonate 500 MG chewable tablet Commonly known as: TUMS - dosed in mg elemental calcium Chew 1-2 tablets by mouth daily as needed for indigestion or heartburn.   Calcium-Magnesium-Zinc 333-133-5 MG Tabs Take 1 tablet by mouth daily.   cetirizine 10 MG tablet Commonly known as: ZYRTEC Take 10 mg by mouth daily as needed for allergies.   cyanocobalamin 100 MCG tablet Commonly known as: VITAMIN B12 Take 100 mcg by mouth daily.   diphenhydramine-acetaminophen 25-500 MG Tabs tablet Commonly known as: TYLENOL PM Take 1 tablet by mouth at bedtime as needed (sleep).   dorzolamide-timolol 22.3-6.8 MG/ML ophthalmic solution Commonly known as: COSOPT Place 1 drop into the left eye 2 (two) times daily.   fluticasone 50 MCG/ACT nasal spray Commonly known as: FLONASE Place 1 spray into the nose 2 (two) times daily as needed for allergies.   levothyroxine 75 MCG tablet Commonly known as: SYNTHROID Take 75 mcg by mouth daily before breakfast.   melatonin 5 MG Tabs Take 5 mg by mouth at bedtime as needed (sleep).   nitroGLYCERIN 0.4 MG SL tablet Commonly known as: NITROSTAT Place 1 tablet (0.4 mg total) under the tongue every 5 (five) minutes as needed for chest pain.   rosuvastatin 20 MG tablet Commonly known as: CRESTOR TAKE 1 TABLET EVERY DAY What changed: when to take this   SALONPAS PAIN RELIEF PATCH EX Apply 1 application topically daily as needed (pain).   SYSTANE OP Place 1 drop into both eyes  daily as needed (dryness).        Follow-up Information     [name], MD. Go on 03/07/2022.   Specialty: Family Medicine Why: @2 :30pm Contact information: [place] [address] [phone]         [name], MD .   Specialty: Cardiology Contact information: [address] [place] [address] [phone]                Feels better, breathing better  Discharge Exam: Filed Weights   02/23/22 0446 02/24/22 0625 02/25/22 0645  Weight: 63 kg 62.7 kg 62.3 kg   Physical Exam Vitals reviewed.  Constitutional:      General: She is not in acute distress.    Appearance: She is not ill-appearing or toxic-appearing.  Cardiovascular:     Rate and Rhythm: Normal rate and regular rhythm.     Heart sounds: No murmur heard. Pulmonary:     Effort: Pulmonary effort is normal. No respiratory distress.     Breath sounds: Rales (crackles bilateral bases) present. No wheezing  or rhonchi.  Neurological:     Mental Status: She is alert.  Psychiatric:        Mood and Affect: Mood normal.        Behavior: Behavior normal.      Condition at discharge: good  The results of significant diagnostics from this hospitalization (including imaging, microbiology, ancillary and laboratory) are listed below for reference.   Imaging Studies: DG CHEST PORT 1 VIEW  Result Date: 02/18/2022 CLINICAL DATA:  Acute respiratory failure with hypoxia. EXAM: PORTABLE CHEST 1 VIEW COMPARISON:  02/16/22 FINDINGS: Stable cardiomediastinal contours. There is been progressive airspace consolidation involving the entire left lower lobe and medial right lung base. Possible small new left pleural effusion. Visualized osseous structures appear intact. IMPRESSION: 1. Progressive airspace consolidation involving the left lower lobe and medial right lung base. 2. Possible small new left pleural effusion. Electronically Signed   By: Kerby Moors M.D.   On:  02/18/2022 06:33   CT Angio Chest/Abd/Pel for Dissection W and/or Wo Contrast  Result Date: 02/16/2022 CLINICAL DATA:  Chest and back pain. EXAM: CT ANGIOGRAPHY CHEST, ABDOMEN AND PELVIS TECHNIQUE: Non-contrast CT of the chest was initially obtained. Multidetector CT imaging through the chest, abdomen and pelvis was performed using the standard protocol during bolus administration of intravenous contrast. Multiplanar reconstructed images and MIPs were obtained and reviewed to evaluate the vascular anatomy. RADIATION DOSE REDUCTION: This exam was performed according to the departmental dose-optimization program which includes automated exposure control, adjustment of the mA and/or kV according to patient size and/or use of iterative reconstruction technique. CONTRAST:  61mL OMNIPAQUE IOHEXOL 350 MG/ML SOLN COMPARISON:  None Available. FINDINGS: CTA CHEST FINDINGS Cardiovascular: Preferential opacification of the thoracic aorta. No evidence of thoracic aortic aneurysm or dissection. The heart is mildly enlarged. There is no pericardial effusion. There is no central pulmonary embolism. There are atherosclerotic calcifications of the aorta. Mediastinum/Nodes: Thyroid gland is not visualized. There are no enlarged mediastinal or hilar lymph nodes. Esophagus is unremarkable. Lungs/Pleura: Mild emphysematous changes are present. There is airspace consolidation throughout the left lower lobe. There is also moderate amount of airspace disease in the right lower lobe and a small amount of airspace disease in the lingula and right middle lobe. No pleural effusion or pneumothorax. Musculoskeletal: No chest wall abnormality. No acute or significant osseous findings. Review of the MIP images confirms the above findings. CTA ABDOMEN AND PELVIS FINDINGS VASCULAR Aorta: Normal caliber aorta without aneurysm, dissection, vasculitis or significant stenosis. There is moderate calcified atherosclerotic disease throughout the aorta.  Celiac: Patent without evidence of aneurysm, dissection, vasculitis or significant stenosis. SMA: Patent without evidence of aneurysm, dissection, vasculitis or significant stenosis. Renals: Both renal arteries are patent without evidence of aneurysm, dissection, vasculitis, fibromuscular dysplasia or significant stenosis. IMA: Patent without evidence of aneurysm, dissection, vasculitis or significant stenosis. Inflow: Patent without evidence of aneurysm, dissection, vasculitis or significant stenosis. Veins: No obvious venous abnormality within the limitations of this arterial phase study. Review of the MIP images confirms the above findings. NON-VASCULAR Hepatobiliary: No focal liver abnormality is seen. No gallstones, gallbladder wall thickening, or biliary dilatation. Pancreas: Pancreatic body and tail are atrophic. Focal calcifications are seen in the mid pancreatic body. The pancreatic head and neck appear within normal limits. Spleen: Normal in size without focal abnormality. Adrenals/Urinary Tract: There is a 3.5 cm cyst in the left kidney. There is a 9 mm cyst in the right kidney. There is no hydronephrosis or perinephric fat stranding. The adrenal  glands and bladder are within normal limits. Stomach/Bowel: Stomach is within normal limits. Appendix is not seen. No evidence of bowel wall thickening, distention, or inflammatory changes. There is diffuse colonic diverticulosis. Lymphatic: No enlarged lymph nodes are identified. Reproductive: Status post hysterectomy. No adnexal masses. Other: No abdominal wall hernia or abnormality. No abdominopelvic ascites. Musculoskeletal: Degenerative changes affect the spine. Review of the MIP images confirms the above findings. IMPRESSION: 1. No evidence for aortic dissection or aneurysm. 2. Multifocal airspace consolidation compatible with pneumonia. This is most significant in the bilateral lower lobes, left greater than right. Recommend follow-up imaging in 4-6 weeks  to confirm resolution. 3. Mild cardiomegaly. 4. Chronic appearing atrophy of the pancreatic body and tail. 5. Colonic diverticulosis. 6. Multiple lesions, including 3.5 cm left Bosniak 1 benign simple cyst. No follow-up imaging is recommended. JACR 2018 Feb; 264-273, Management of the Incidental RenalMass on CT, [employer] 2021; 814-848, Bosniak Classification of Cystic Renal Masses, Version 2019. 7. Aortic Atherosclerosis (ICD10-I70.0) and Emphysema (ICD10-J43.9). Electronically Signed   By: Ronney Asters M.D.   On: 02/16/2022 20:52   DG Chest Portable 1 View  Result Date: 02/16/2022 CLINICAL DATA:  Cough and hypoxia. EXAM: PORTABLE CHEST 1 VIEW COMPARISON:  03/06/2020 radiograph FINDINGS: The cardiomediastinal silhouette is unremarkable. Mild peribronchial thickening and mild bibasilar atelectasis/scarring again noted. There is no evidence of focal airspace disease, pulmonary edema, suspicious pulmonary nodule/mass, pleural effusion, or pneumothorax. No acute bony abnormalities are identified. IMPRESSION: No evidence of acute cardiopulmonary disease. Electronically Signed   By: Margarette Canada M.D.   On: 02/16/2022 15:42    Microbiology: Results for orders placed or performed during the hospital encounter of 02/16/22  Blood culture (routine x 2)     Status: None   Collection Time: 02/16/22  2:43 PM   Specimen: BLOOD  Result Value Ref Range Status   Specimen Description BLOOD LEFT ANTECUBITAL  Final   Special Requests   Final    BOTTLES DRAWN AEROBIC AND ANAEROBIC Blood Culture adequate volume   Culture   Final    NO GROWTH 5 DAYS Performed at [facility], [place] [address], [place],  [address]    Report Status 02/21/2022 FINAL  Final  SARS Coronavirus 2 by RT PCR (hospital order, performed in [facility] hospital lab) *cepheid single result test* Anterior Nasal Swab     Status: None   Collection Time: 02/16/22  2:44 PM   Specimen: Anterior Nasal Swab  Result Value Ref Range Status    SARS Coronavirus 2 by RT PCR NEGATIVE NEGATIVE Final    Comment: (NOTE) SARS-CoV-2 target nucleic acids are NOT DETECTED.  The SARS-CoV-2 RNA is generally detectable in upper and lower respiratory specimens during the acute phase of infection. The lowest concentration of SARS-CoV-2 viral copies this assay can detect is 250 copies / mL. A negative result does not preclude SARS-CoV-2 infection and should not be used as the sole basis for treatment or other patient management decisions.  A negative result may occur with improper specimen collection / handling, submission of specimen other than nasopharyngeal swab, presence of viral mutation(s) within the areas targeted by this assay, and inadequate number of viral copies (<250 copies / mL). A negative result must be combined with clinical observations, patient history, and epidemiological information.  Fact Sheet for Patients:   https://www.patel.info/  Fact Sheet for Healthcare Providers: https://hall.com/  This test is not yet approved or  cleared by the [place] FDA and has been authorized for  detection and/or diagnosis of SARS-CoV-2 by FDA under an Emergency Use Authorization (EUA).  This EUA will remain in effect (meaning this test can be used) for the duration of the COVID-19 declaration under Section 564(b)(1) of the Act, 21 U.S.C. section 360bbb-3(b)(1), unless the authorization is terminated or revoked sooner.  Performed at [facility], [place] [address] [place], [place] [address]   Blood culture (routine x 2)     Status: None   Collection Time: 02/16/22  8:42 PM   Specimen: BLOOD LEFT HAND  Result Value Ref Range Status   Specimen Description BLOOD LEFT HAND  Final   Special Requests   Final    BOTTLES DRAWN AEROBIC AND ANAEROBIC Blood Culture results may not be optimal due to an inadequate volume of blood received in culture bottles   Culture   Final    NO GROWTH  5 DAYS Performed at [facility], [place] [address], [place], [place] [address]    Report Status 02/21/2022 FINAL  Final  MRSA Next Gen by PCR, Nasal     Status: None   Collection Time: 02/16/22  9:07 PM   Specimen: Nasal Mucosa; Nasal Swab  Result Value Ref Range Status   MRSA by PCR Next Gen NOT DETECTED NOT DETECTED Final    Comment: (NOTE) The GeneXpert MRSA Assay (FDA approved for NASAL specimens only), is one component of a comprehensive MRSA colonization surveillance program. It is not intended to diagnose MRSA infection nor to guide or monitor treatment for MRSA infections. Test performance is not FDA approved in patients less than 66 years old. Performed at [facility], [place] [address] [place], [place] [address]     Labs: CBC: Recent Labs  Lab 02/19/22 0507 02/20/22 0600  WBC 12.4* 9.6  HGB 11.0* 10.4*  HCT 33.6* 32.5*  MCV 92.6 93.1  PLT 148* 675   Basic Metabolic Panel: Recent Labs  Lab 02/19/22 0507 02/20/22 0600  NA 137 139  K 3.1* 3.8  CL 107 107  CO2 21* 22  GLUCOSE 117* 122*  BUN 21 15  CREATININE 1.01* 0.95  CALCIUM 8.3* 8.4*  MG 1.8  --    Liver Function Tests: No results for input(s): "AST", "ALT", "ALKPHOS", "BILITOT", "PROT", "ALBUMIN" in the last 168 hours. CBG: Recent Labs  Lab 02/24/22 0623 02/24/22 1108 02/24/22 1613 02/24/22 2106 02/25/22 0647  GLUCAP 130* 163* 143* 232* 118*    Discharge time spent: greater than 30 minutes.  Signed: Murray Hodgkins, MD [facility] 02/25/2022

## 2022-02-25 NOTE — Care Management Important Message (Signed)
Important Message  Patient Details  Name: Cheryl Mora MRN: [id number] Date of Birth: 1932-10-25   Medicare Important Message Given:  Yes     Shelda Altes 02/25/2022, 8:49 AM

## 2022-02-25 NOTE — TOC Transition Note (Signed)
Transition of Care [facility]) - CM/SW Discharge Note   Patient Details  Name: Marchell Froman MRN: [id number] Date of Birth: 1933-05-01  [facility] CM/SW Contact:  Bethann Berkshire, [place] Phone Number: 02/25/2022, 11:46 AM   Clinical Narrative:     Patient will DC to: [facility] Anticipated DC date: 02/25/22 Family notified: Raelyn Ensign (Daughter)  [phone] (Mobile) Transport by: Corey Harold   Per MD patient ready for DC to [facility] SNF. RN, patient, patient's family, and facility notified of DC. Discharge Summary and FL2 sent to facility. RN to call report prior to discharge ([phone]). DC packet on chart. Ambulance transport requested for patient.   CSW will sign off for now as social work intervention is no longer needed. Please consult [place] again if new needs arise.   Final next level of care: Skilled Nursing Facility Barriers to Discharge: No Barriers Identified   Patient Goals and CMS Choice Patient states their goals for this hospitalization and ongoing recovery are:: to start swimming again CMS Medicare.gov Compare Post Acute Care list provided to:: Patient Choice offered to / list presented to : Patient  Discharge Placement              Patient chooses bed at: [facility] Patient to be transferred to facility by: [facility] Name of family member notified: Raelyn Ensign (Daughter)   [phone] (Mobile) Patient and family notified of of transfer: 02/25/22  Discharge Plan and Services     Post Acute Care Choice: [facility]                               Social Determinants of Health (SDOH) Interventions     Readmission Risk Interventions     No data to display

## 2022-02-25 NOTE — NC FL2 (Signed)
[place] LEVEL OF CARE SCREENING TOOL     IDENTIFICATION  Patient Name: Cheryl Mora Birthdate: Feb 07, 1933 Sex: female Admission Date (Current Location): 02/16/2022  [facility] and [place] Number:  Herbalist and Address:  [facility] [place] [place] [address] [place], [place] [address]      Provider Number: [id number]  Attending Physician Name and Address:  Samuella Cota, MD  Relative Name and Phone Number:  Raelyn Ensign (Daughter)   [phone] [facility])    Current Level of Care: Hospital Recommended Level of Care: [place] Prior Approval Number:    Date Approved/Denied:   PASRR Number: [id number]  Discharge Plan: SNF    Current Diagnoses: Patient Active Problem List   Diagnosis Date Noted   Aspiration pneumonia ([place]) 02/22/2022   PAF (paroxysmal atrial fibrillation) ([place]) 02/22/2022   AKI (acute kidney injury) ([place]) 02/20/2022   Acute hypoxemic respiratory failure ([place]) 02/16/2022   Persistent atrial fibrillation (HCC)    Acute systolic heart failure ([place]) 03/07/2020   Dyspnea on exertion 03/06/2020   Hyperlipidemia 03/06/2020   Type 2 diabetes mellitus without complication, without long-term current use of insulin ([place]) 03/06/2020   Benign essential HTN 03/06/2020   NICM (nonischemic cardiomyopathy) ([place]) 03/06/2020   Atrial fibrillation with RVR ([place]) 03/06/2020    Orientation RESPIRATION BLADDER Height & Weight     Self, Time, Situation, Place  O2 (2L [place]) Continent Weight: 137 lb 4.8 oz (62.3 kg) Height:  5\' 2"  (157.5 cm)  BEHAVIORAL SYMPTOMS/MOOD NEUROLOGICAL BOWEL NUTRITION STATUS      Continent Diet (See DC summary)  AMBULATORY STATUS COMMUNICATION OF NEEDS Skin   Limited Assist Verbally Other (Comment) (Skin tear on elbow)                       Personal Care Assistance Level of Assistance  Bathing, Feeding, Dressing Bathing Assistance: Limited assistance Feeding assistance:  Independent Dressing Assistance: Limited assistance     Functional Limitations Info  Sight, Hearing, Speech Sight Info: Impaired Hearing Info: Adequate Speech Info: Adequate    SPECIAL CARE FACTORS FREQUENCY  PT (By licensed PT), OT (By licensed OT)     PT Frequency: 5x/week OT Frequency: 5x/week            Contractures Contractures Info: Not present    Additional Factors Info  Code Status, Allergies, Insulin Sliding Scale Code Status Info: Full Allergies Info: Amoxicillin-pot Clavulanate, Brimonidine, Lisinopril, Brimonidine Tartrate-timolol, Zolpidem   Insulin Sliding Scale Info: see dc summary       Current Medications (02/25/2022):  This is the current hospital active medication list Current Facility-Administered Medications  Medication Dose Route Frequency Provider Last Rate Last Admin   0.9 %  sodium chloride infusion   Intravenous PRN Samuella Cota, MD 20 mL/hr at 02/22/22 2150 New Bag at 02/22/22 2150   amiodarone (PACERONE) tablet 200 mg  200 mg Oral Daily Olalere, Adewale A, MD   200 mg at 02/25/22 0944   amLODipine (NORVASC) tablet 5 mg  5 mg Oral Daily Olalere, Adewale A, MD   5 mg at 02/25/22 0944   apixaban (ELIQUIS) tablet 5 mg  5 mg Oral BID Olalere, Adewale A, MD   5 mg at 02/25/22 0944   ascorbic acid (VITAMIN C) tablet 250 mg  250 mg Oral BID Olalere, Adewale A, MD   250 mg at 02/25/22 0944   calcium citrate (CALCITRATE - dosed in mg elemental calcium) tablet 200  mg of elemental calcium  200 mg of elemental calcium Oral Daily Leander Rams, RPH   200 mg of elemental calcium at 02/25/22 0944   diphenhydrAMINE (BENADRYL) capsule 25 mg  25 mg Oral Q6H PRN Olalere, Adewale A, MD       docusate sodium (COLACE) capsule 100 mg  100 mg Oral BID PRN Olalere, Adewale A, MD   100 mg at 02/22/22 2107   dorzolamide-timolol (COSOPT) 22.3-6.8 MG/ML ophthalmic solution 1 drop  1 drop Left Eye BID Olalere, Adewale A, MD   1 drop at 02/24/22 2140   fluticasone  (FLONASE) 50 MCG/ACT nasal spray 1 spray  1 spray Each Nare BID PRN Olalere, Adewale A, MD       guaiFENesin (MUCINEX) 12 hr tablet 600 mg  600 mg Oral BID Elgergawy, Leana Roe, MD   600 mg at 02/25/22 0944   insulin aspart (novoLOG) injection 0-15 Units  0-15 Units Subcutaneous TID WC & HS Standley Brooking, MD   5 Units at 02/24/22 2148   levothyroxine (SYNTHROID) tablet 75 mcg  75 mcg Oral Q0600 Olalere, Adewale A, MD   75 mcg at 02/25/22 0700   melatonin tablet 5 mg  5 mg Oral QHS PRN Olalere, Adewale A, MD       nitroGLYCERIN (NITROSTAT) SL tablet 0.4 mg  0.4 mg Sublingual Q5 min PRN Olalere, Adewale A, MD       ondansetron (ZOFRAN) injection 4 mg  4 mg Intravenous Q6H PRN Gaetana Michaelis, MD       Oral care mouth rinse  15 mL Mouth Rinse PRN Ollis, Brandi L, NP       polyethylene glycol (MIRALAX / GLYCOLAX) packet 17 g  17 g Oral Daily PRN Olalere, Adewale A, MD   17 g at 02/20/22 6270   polyvinyl alcohol (LIQUIFILM TEARS) 1.4 % ophthalmic solution 1 drop  1 drop Both Eyes PRN Olalere, Adewale A, MD       rosuvastatin (CRESTOR) tablet 20 mg  20 mg Oral Daily Olalere, Adewale A, MD   20 mg at 02/24/22 2138   vitamin B-12 (CYANOCOBALAMIN) tablet 100 mcg  100 mcg Oral Daily Olalere, Adewale A, MD   100 mcg at 02/25/22 0944     Discharge Medications: Please see discharge summary for a list of discharge medications.  Relevant Imaging Results:  Relevant Lab Results:   Additional Information SSn: [id number]  Doyle Kunath P Christain [place], LCSW

## 2022-04-03 ENCOUNTER — Other Ambulatory Visit: Payer: Self-pay | Admitting: General Practice

## 2022-04-03 DIAGNOSIS — I4891 Unspecified atrial fibrillation: Secondary | ICD-10-CM

## 2022-05-14 ENCOUNTER — Ambulatory Visit (INDEPENDENT_AMBULATORY_CARE_PROVIDER_SITE_OTHER): Payer: Medicare HMO | Admitting: Internal Medicine

## 2022-05-14 ENCOUNTER — Encounter: Payer: Self-pay | Admitting: Internal Medicine

## 2022-05-14 ENCOUNTER — Other Ambulatory Visit: Payer: Self-pay | Admitting: Internal Medicine

## 2022-05-14 VITALS — BP 122/80 | HR 67 | Temp 98.7°F | Resp 18 | Ht 62.0 in | Wt 138.8 lb

## 2022-05-14 DIAGNOSIS — E0822 Diabetes mellitus due to underlying condition with diabetic chronic kidney disease: Secondary | ICD-10-CM | POA: Insufficient documentation

## 2022-05-14 DIAGNOSIS — E785 Hyperlipidemia, unspecified: Secondary | ICD-10-CM | POA: Diagnosis not present

## 2022-05-14 DIAGNOSIS — H6592 Unspecified nonsuppurative otitis media, left ear: Secondary | ICD-10-CM | POA: Diagnosis not present

## 2022-05-14 DIAGNOSIS — N1831 Chronic kidney disease, stage 3a: Secondary | ICD-10-CM

## 2022-05-14 MED ORDER — CEFDINIR 300 MG PO CAPS: 300.0000 mg | ORAL_CAPSULE | Freq: Two times a day (BID) | ORAL | 0 refills | Status: DC

## 2022-05-14 NOTE — Progress Notes (Signed)
   Established Patient Office Visit  Subjective   Patient ID: Cheryl Mora, female    DOB: 1933/02/03  Age: 88 y.o. MRN: [id number]  No chief complaint on file.   HPI 88 years old female is c/o left ear pain. She says she has difficulty hearing from left ear, she wanted her left ear to be checked.  She also has diabetes mellitus and she brought home reading that shows her sugar been between 98 to 147 mg/dl. She also has CKD 3a and is here for repeat labs.   She also has hypertension and brought home readings, her BP is between 109/60 to 150/78 but only once it was high.   She also has dyslipidemia and LDL is target control.   She also has PAF and is on eliquis. She will follow with cardiologist.    Review of Systems  Constitutional: Negative.   HENT:  Positive for ear pain and hearing loss.   Respiratory: Negative.    Cardiovascular: Negative.   Gastrointestinal: Negative.   Neurological: Negative.       Objective:     BP 122/80 (BP Location: Left Arm, Patient Position: Sitting, Cuff Size: Normal)   Pulse 67   Temp 98.7 F (37.1 C) (Temporal)   Resp 18   Ht 5\' 2"  (1.575 m)   Wt 138 lb 12.8 oz (63 kg)   SpO2 96%   BMI 25.39 kg/m    Physical Exam Constitutional:      Appearance: She is obese.  HENT:     Head: Normocephalic and atraumatic.     Right Ear: Tympanic membrane normal.  Cardiovascular:     Rate and Rhythm: Normal rate.     Heart sounds: Normal heart sounds.  Pulmonary:     Effort: Pulmonary effort is normal.     Breath sounds: Normal breath sounds.  Abdominal:     General: Bowel sounds are normal.     Palpations: Abdomen is soft.  Neurological:     General: No focal deficit present.     Mental Status: She is alert and oriented to person, place, and time.  Psychiatric:        Mood and Affect: Mood normal.      No results found for any visits on 05/14/22.    The ASCVD Risk score (Arnett DK, et al., 2019) failed to calculate for the  following reasons:   The 2019 ASCVD risk score is only valid for ages 43 to 32    Assessment & Plan:   Problem List Items Addressed This Visit       Endocrine   Diabetes mellitus due to underlying condition with stage 3a chronic kidney disease (HCC)   Relevant Medications   metFORMIN (GLUCOPHAGE-XR) 500 MG 24 hr tablet     Nervous and Auditory   Left otitis media with effusion - Primary   Relevant Medications   cefdinir (OMNICEF) 300 MG capsule     Other   Dyslipidemia    Return in about 2 weeks (around 05/28/2022).    05/30/2022, MD

## 2022-05-15 LAB — COMPREHENSIVE METABOLIC PANEL
ALT: 79 IU/L — ABNORMAL HIGH (ref 0–32)
AST: 74 IU/L — ABNORMAL HIGH (ref 0–40)
Albumin/Globulin Ratio: 1.8 (ref 1.2–2.2)
Albumin: 4.4 g/dL (ref 3.7–4.7)
Alkaline Phosphatase: 82 IU/L (ref 44–121)
BUN/Creatinine Ratio: 13 (ref 12–28)
BUN: 21 mg/dL (ref 8–27)
Bilirubin Total: 0.3 mg/dL (ref 0.0–1.2)
CO2: 20 mmol/L (ref 20–29)
Calcium: 8.3 mg/dL — ABNORMAL LOW (ref 8.7–10.3)
Chloride: 105 mmol/L (ref 96–106)
Creatinine, Ser: 1.68 mg/dL — ABNORMAL HIGH (ref 0.57–1.00)
Globulin, Total: 2.4 g/dL (ref 1.5–4.5)
Glucose: 173 mg/dL — ABNORMAL HIGH (ref 70–99)
Potassium: 4.6 mmol/L (ref 3.5–5.2)
Sodium: 141 mmol/L (ref 134–144)
Total Protein: 6.8 g/dL (ref 6.0–8.5)
eGFR: 29 mL/min/{1.73_m2} — ABNORMAL LOW (ref >59)

## 2022-05-15 LAB — TSH: TSH: 3.23 u[IU]/mL (ref 0.450–4.500)

## 2022-05-20 NOTE — Progress Notes (Signed)
Cardiology Office Note:    Date:  11/19/2021   ID:  Cheryl Mora, DOB [birth year], MRN [id number]  PCP:  [name], MD  Cardiologist:  [name], MD  Referring MD: [name], MD   CC: follow up  History of Present Illness:    Cheryl Mora is a 88 y.o. female with a hx of type II diabetes, hyperlipidemia, hypertension, hypothyroidism, paroxysmal atrial fibrillation who is seen for follow up.   Cardiac history: I met her 03/02/20 as a new consult for DOE. Very concerning story, set up for outpatient cath. Cath 9/27 with NICM, reduced EF, and new afib RVR. Her medications were adjusted, and she spontaneously converted to sinus rhythm prior to discharge 03/07/20. Since then, she has returned to atrial fibrillation despite amiodarone and reattempt at cardioversion.  Today: She is asking about reducing apixaban dose due to easy bruising. We reviewed that she does not meet criteria yet (meets age, does not meet weight or Cr cutoff--Cr 1.2 in 08/2021). She has not been taking BP as frequently since the move. Prior numbers 113/60-159/81, most average 034K systolic  She has been moving furniture and boxes. She is exhausted from moving, and her has not been sleeping well. The air conditioner is loud and she is unable to sleep when in turns on, but she states that she was having problems sleeping even before the air conditioning.   She experiencing lightheadedness whenever she is bending over and standing upright repeatedly. She had to take a break for at least 3 minutes, and then the lightheadedness resolves.   For activity, she does weight lifting and swimming, as well as walking whenever possible.   The patient denies chest pain, shortness of breath, nocturnal dyspnea, or orthopnea.  There have been no palpitations, lightheadedness or syncope. Complains of bruising easily.    Past Medical History:  Diagnosis Date   Abnormal EKG    suggestive of previous anterior  wall myocardial infarction   Diabetes mellitus ([place])    Hypertension    Hypothyroidism     Past Surgical History:  Procedure Laterality Date   ABDOMINAL HYSTERECTOMY     CARDIOVERSION N/A 04/13/2020   Procedure: CARDIOVERSION;  Surgeon: Elouise Munroe, MD;  Location: [facility] ENDOSCOPY;  Service: Cardiovascular;  Laterality: N/A;   CATARACT EXTRACTION     KNEE SURGERY     LEFT HEART CATH AND CORONARY ANGIOGRAPHY N/A 03/06/2020   Procedure: LEFT HEART CATH AND CORONARY ANGIOGRAPHY;  Surgeon: Martinique, Peter M, MD;  Location: [facility] LAB;  Service: Cardiovascular;  Laterality: N/A;    Current Medications: Current Outpatient Medications on File Prior to Visit  Medication Sig   amiodarone (PACERONE) 200 MG tablet TAKE 1 TABLET EVERY DAY   amLODipine (NORVASC) 5 MG tablet Take 1 tablet (5 mg total) by mouth daily.   apixaban (ELIQUIS) 5 MG TABS tablet TAKE 1 TABLET (5MG TOTAL) TWICE DAILY.   ascorbic acid (VITAMIN C) 250 MG CHEW Chew 250 mg by mouth 2 (two) times daily.   calcium carbonate (TUMS - DOSED IN MG ELEMENTAL CALCIUM) 500 MG chewable tablet Chew 1-2 tablets by mouth daily as needed for indigestion or heartburn.   Calcium-Magnesium-Zinc 333-133-5 MG TABS Take 1 tablet by mouth daily.   cetirizine (ZYRTEC) 10 MG tablet Take 10 mg by mouth daily as needed for allergies.   diphenhydrAMINE (BENADRYL) 25 MG tablet Take 25 mg by mouth every 6 (six) hours as needed for allergies or sleep.  diphenhydramine-acetaminophen (TYLENOL PM) 25-500 MG TABS tablet Take 1 tablet by mouth at bedtime as needed (sleep).   dorzolamide-timolol (COSOPT) 22.3-6.8 MG/ML ophthalmic solution Place 1 drop into the left eye 2 (two) times daily.   fluticasone (FLONASE) 50 MCG/ACT nasal spray Place 1 spray into the nose 2 (two) times daily as needed for allergies.    levothyroxine (SYNTHROID) 75 MCG tablet Take 75 mcg by mouth daily before breakfast.   melatonin 5 MG TABS Take 5 mg by mouth at bedtime as needed  (sleep).   Menthol-Methyl Salicylate (SALONPAS PAIN RELIEF PATCH EX) Apply 1 application topically daily as needed (pain).   nitroGLYCERIN (NITROSTAT) 0.4 MG SL tablet Place 1 tablet (0.4 mg total) under the tongue every 5 (five) minutes as needed for chest pain.   Polyethyl Glycol-Propyl Glycol (SYSTANE OP) Place 1 drop into both eyes daily as needed (dryness).   rosuvastatin (CRESTOR) 20 MG tablet TAKE 1 TABLET EVERY DAY   vitamin B-12 (CYANOCOBALAMIN) 100 MCG tablet Take 100 mcg by mouth daily.   No current facility-administered medications on file prior to visit.     Allergies:   Amoxicillin-pot clavulanate, Brimonidine, Lisinopril, Brimonidine tartrate-timolol, and Zolpidem   Social History   Tobacco Use   Smoking status: Former   Smokeless tobacco: Never  Scientific laboratory technician Use: Never used  Substance Use Topics   Alcohol use: Yes    Comment: several a day   Drug use: Never    Family History: family history includes Cirrhosis in her mother; Heart attack in an other family member; Hypertension in her mother; Stroke in her mother.  ROS:   Please see the history of present illness.   Additional pertinent ROS otherwise unremarkable.   EKGs/Labs/Other Studies Reviewed:    The following studies were reviewed today:  Echo 09/20/2020:  IMPRESSIONS    1. Left ventricular ejection fraction, by estimation, is 55 to 60%. The  left ventricle has normal function. The left ventricle has no regional  wall motion abnormalities. There is mild left ventricular hypertrophy.  Left ventricular diastolic parameters  are indeterminate.   2. Right ventricular systolic function is normal. The right ventricular  size is normal. There is normal pulmonary artery systolic pressure. The  estimated right ventricular systolic pressure is 55.7 mmHg.   3. Left atrial size was moderately dilated.   4. Right atrial size was mildly dilated.   5. The mitral valve is normal in structure. Moderate  mitral valve  regurgitation. No evidence of mitral stenosis.   6. The aortic valve is tricuspid. Aortic valve regurgitation is not  visualized. Mild aortic valve sclerosis is present, with no evidence of  aortic valve stenosis.   7. The inferior vena cava is normal in size with greater than 50%  respiratory variability, suggesting right atrial pressure of 3 mmHg.   8. The patient was in atrial fibrillation.   LHC 03/06/20 Prox RCA lesion is 30% stenosed. There is severe left ventricular systolic dysfunction. LV end diastolic pressure is normal. The left ventricular ejection fraction is 25-35% by visual estimate.   1. Mild nonobstructive CAD 2. Severe LV dysfunction with pattern consistent with Takotsubo's cardiomyopathy. EF 30% 3. Normal LVEDP 4. New onset Atrial fibrillation compared to yesterday.   Plan: admit to telemetry. Will initiate IV heparin. May consider transition to NOAC tomorrow. Hold amlodipine due to hypotension. Switch atenolol to metoprolol. Check Echo.   Echo 03/06/20  1. Left ventricular ejection fraction, by estimation, is 25 to 30%.  The  left ventricle has severely decreased function. The left ventricle  demonstrates global hypokinesis. Left ventricular diastolic function could  not be evaluated.   2. Right ventricular systolic function is normal. The right ventricular  size is normal. There is normal pulmonary artery systolic pressure. The  estimated right ventricular systolic pressure is 33.8 mmHg.   3. Left atrial size was moderately dilated.   4. Right atrial size was mildly dilated.   5. There is no evidence of cardiac tamponade.   6. The mitral valve is normal in structure. Mild mitral valve  regurgitation. No evidence of mitral stenosis. Moderate mitral annular  calcification.   7. The aortic valve is normal in structure. Aortic valve regurgitation is  trivial. Mild aortic valve sclerosis is present, with no evidence of  aortic valve stenosis.   8. The  inferior vena cava is normal in size with greater than 50%  respiratory variability, suggesting right atrial pressure of 3 mmHg.  EKG:  EKG is personally reviewed.  11/19/2021 not ordered today 08/03/2021: SR with first degree AV block at 62 bpm 04/24/2021: NSR at 66 bpm 08/23/2020: atrial fibrillation at 90 bpm  Recent Labs: No results found for requested labs within last 365 days.   Recent Lipid Panel    Component Value Date/Time   CHOL 121 03/07/2020 1150   TRIG 111 03/07/2020 1150   HDL 48 03/07/2020 1150   CHOLHDL 2.5 03/07/2020 1150   VLDL 22 03/07/2020 1150   LDLCALC 51 03/07/2020 1150    Physical Exam:    VS:  BP (!) 182/76 (BP Location: Left Arm, Patient Position: Sitting, Cuff Size: Normal)   Pulse 67   Ht _0  (1.575 m)   Wt 140 lb 3.2 oz (63.6 kg)   SpO2 99%   BMI 25.64 kg/m     Wt Readings from Last 3 Encounters:  11/19/21 140 lb 3.2 oz (63.6 kg)  08/03/21 135 lb 12.8 oz (61.6 kg)  06/01/21 135 lb 14.4 oz (61.6 kg)    GEN: Well nourished, well developed in no acute distress HEENT: Normal, moist mucous membranes NECK: No JVD CARDIAC: regular rhythm, normal S1 and S2, no rubs or gallops. No murmur. VASCULAR: Radial and DP pulses 2+ bilaterally. No carotid bruits RESPIRATORY:  Clear to auscultation without rales, wheezing or rhonchi  ABDOMEN: Soft, non-tender, non-distended MUSCULOSKELETAL:  Ambulates independently SKIN: Warm and dry, trivial bilateral LE edema at the medial aspect of ankle/leg. Diffuse scattered ecchymoses NEUROLOGIC:  Alert and oriented x 3. No focal neuro deficits noted. PSYCHIATRIC:  Normal affect    ASSESSMENT:    1. Labile hypertension   2. Paroxysmal atrial fibrillation (HCC)   3. NICM (nonischemic cardiomyopathy) ([place])   4. Nonocclusive coronary atherosclerosis of native coronary artery   5. Pure hypercholesterolemia   6. Secondary hypercoagulable state ([place])   7. Long term current use of anticoagulant    PLAN:    Labile  hypertension/hypotension Asymptomatic elevated blood pressures today -we reviewed her home logs, and we have validated her home cuff before. She has some lability (both high and low), but her average is at goal. Difficult situation as medications have needed to be cut back in the past due to hypotension. Would use home readings and not office numbers to adjust medications. -tolerating amlodipine 5 mg daily -counseled on red flag warning signs that need immediate medical attention  Atrial fibrillation, paroxysmal -in sinus rhythm, continue amiodarone -CHA2DS2/VAS Stroke Risk Points=  6. -continue apixaban, we reviewed that  she does not currently qualify for reduced dose  Nonischemic cardiomyopathy, with recovered EF -tried jardiance, felt terrible. Refuses to reattempt this or similar medications -did not tolerate losartan or metoprolol well previously. Labile BP, as above  Nonobstructive CAD Hypercholesterolemia -goal LDL <70 -continue rosuvastatin  Type II diabetes: -on metformin -did not tolerate Jardiance, made her feel terrible, refuses to trial similar  Cardiac risk counseling and prevention recommendations: -recommend heart healthy/Mediterranean diet, with whole grains, fruits, vegetable, fish, lean meats, nuts, and olive oil. Limit salt. -recommend moderate walking, 3-5 times/week for 30-50 minutes each session. Aim for at least 150 minutes.week. Goal should be pace of 3 miles/hours, or walking 1.5 miles in 30 minutes -recommend avoidance of tobacco products. Avoid excess alcohol.  Plan for follow up: 6 months or sooner as needed.  [name], MD, PhD, [facility]   Medication Adjustments/Labs and Tests Ordered: Current medicines are reviewed at length with the patient today.  Concerns regarding medicines are outlined above.   No orders of the defined types were placed in this encounter.  No orders of the defined types were placed in this  encounter.  Patient Instructions   Follow-Up: At [facility], you and your health needs are our priority.  As part of our continuing mission to provide you with exceptional heart care, we have created designated Provider Care Teams.  These Care Teams include your primary Cardiologist (physician) and Advanced Practice Providers (APPs -  Physician Assistants and Nurse Practitioners) who all work together to provide you with the care you need, when you need it.  We recommend signing up for the patient portal called "MyChart".  Sign up information is provided on this After Visit Summary.  MyChart is used to connect with patients for Virtual Visits (Telemedicine).  Patients are able to view lab/test results, encounter notes, upcoming appointments, etc.  Non-urgent messages can be sent to your provider as well.   To learn more about what you can do with MyChart, go to [email or web address].    Your next appointment:   6 month(s)  The format for your next appointment:   In Person  Provider:   [name], MD      Important Information About Sugar          I,Tinashe Williams,acting as a scribe for [name], MD.,have documented all relevant documentation on the behalf of [name], MD,as directed by  [name], MD while in the presence of [name], MD.   I, [name], MD, have reviewed all documentation for this visit. The documentation on 11/19/21 for the exam, diagnosis, procedures, and orders are all accurate and complete.

## 2022-05-21 ENCOUNTER — Ambulatory Visit (HOSPITAL_BASED_OUTPATIENT_CLINIC_OR_DEPARTMENT_OTHER): Payer: Medicare HMO | Admitting: Cardiology

## 2022-05-21 ENCOUNTER — Encounter (HOSPITAL_BASED_OUTPATIENT_CLINIC_OR_DEPARTMENT_OTHER): Payer: Self-pay | Admitting: Cardiology

## 2022-05-21 VITALS — BP 115/60 | HR 60 | Ht 62.0 in | Wt 136.0 lb

## 2022-05-21 DIAGNOSIS — Z7901 Long term (current) use of anticoagulants: Secondary | ICD-10-CM

## 2022-05-21 DIAGNOSIS — I2510 Atherosclerotic heart disease of native coronary artery without angina pectoris: Secondary | ICD-10-CM | POA: Diagnosis not present

## 2022-05-21 DIAGNOSIS — I480 Paroxysmal atrial fibrillation: Secondary | ICD-10-CM

## 2022-05-21 DIAGNOSIS — E119 Type 2 diabetes mellitus without complications: Secondary | ICD-10-CM

## 2022-05-21 DIAGNOSIS — E7800 Pure hypercholesterolemia, unspecified: Secondary | ICD-10-CM

## 2022-05-21 DIAGNOSIS — I428 Other cardiomyopathies: Secondary | ICD-10-CM

## 2022-05-21 DIAGNOSIS — D6869 Other thrombophilia: Secondary | ICD-10-CM

## 2022-05-21 DIAGNOSIS — R0989 Other specified symptoms and signs involving the circulatory and respiratory systems: Secondary | ICD-10-CM | POA: Diagnosis not present

## 2022-05-21 NOTE — Patient Instructions (Signed)
Medication Instructions:  Your Physician recommend you continue on your current medication as directed.    *If you need a refill on your cardiac medications before your next appointment, please call your pharmacy*  Follow-Up: At [facility], you and your health needs are our priority.  As part of our continuing mission to provide you with exceptional heart care, we have created designated Provider Care Teams.  These Care Teams include your primary Cardiologist (physician) and Advanced Practice Providers (APPs -  Physician Assistants and Nurse Practitioners) who all work together to provide you with the care you need, when you need it.  We recommend signing up for the patient portal called "MyChart".  Sign up information is provided on this After Visit Summary.  MyChart is used to connect with patients for Virtual Visits (Telemedicine).  Patients are able to view lab/test results, encounter notes, upcoming appointments, etc.  Non-urgent messages can be sent to your provider as well.   To learn more about what you can do with MyChart, go to [email or web address].    Your next appointment:   9 month(s)  The format for your next appointment:   In Person  Provider:   Jodelle Red, MD

## 2022-05-22 ENCOUNTER — Encounter (HOSPITAL_BASED_OUTPATIENT_CLINIC_OR_DEPARTMENT_OTHER): Payer: Self-pay | Admitting: Cardiology

## 2022-05-28 ENCOUNTER — Other Ambulatory Visit: Payer: Self-pay

## 2022-05-28 ENCOUNTER — Other Ambulatory Visit: Payer: Self-pay | Admitting: Internal Medicine

## 2022-05-28 ENCOUNTER — Ambulatory Visit: Payer: Medicare HMO | Admitting: Internal Medicine

## 2022-05-28 ENCOUNTER — Encounter: Payer: Self-pay | Admitting: Internal Medicine

## 2022-05-28 VITALS — BP 122/78 | HR 72 | Temp 97.5°F | Resp 18 | Ht 62.0 in | Wt 134.6 lb

## 2022-05-28 DIAGNOSIS — R945 Abnormal results of liver function studies: Secondary | ICD-10-CM | POA: Diagnosis not present

## 2022-05-28 DIAGNOSIS — I10 Essential (primary) hypertension: Secondary | ICD-10-CM | POA: Diagnosis not present

## 2022-05-28 DIAGNOSIS — N179 Acute kidney failure, unspecified: Secondary | ICD-10-CM | POA: Diagnosis not present

## 2022-05-28 DIAGNOSIS — M542 Cervicalgia: Secondary | ICD-10-CM | POA: Insufficient documentation

## 2022-05-28 MED ORDER — FLUTICASONE PROPIONATE 50 MCG/ACT NA SUSP: 1.0000 | Freq: Two times a day (BID) | NASAL | 6 refills | Status: DC | PRN

## 2022-05-28 MED ORDER — DICLOFENAC SODIUM 1 % EX GEL: 2.0000 g | Freq: Four times a day (QID) | CUTANEOUS | 1 refills | Status: AC

## 2022-05-28 NOTE — Patient Instructions (Signed)
She will apply warm compresses to her neck.

## 2022-05-28 NOTE — Progress Notes (Signed)
   Established Patient Office Visit  Subjective   Patient ID: Cheryl Mora, female    DOB: 07/30/1932  Age: 88 y.o. MRN: [id number]  Chief Complaint  Patient presents with   Follow-up    2 week for continued left ear pain and neck pain Using OTC tylenol, biofreeze and arnica cream for neck pain    HPI 88 years old female is here for follow up. She has finished antibiotic, she is c/o pain in her left ear, rather whole area, she says she has neck pain now it has gone to whole left side posterior face and left side of neck.   She saw cardiologist and I have reviewed their note.   Her kidney function  and liver function was slightly worse with no obvious reason. I have discussed with her about.She has diabetes and hypertension, she has brought home reading for BP and blood sugar with her. Her fasting sugar is between 103 to 125 mg/dl and BO only twice was above 150 but she was not resting at that time.   Review of Systems  Constitutional: Negative.   HENT: Negative.    Gastrointestinal: Negative.   Musculoskeletal:  Positive for neck pain.      Objective:     BP 122/78 (BP Location: Left Arm, Patient Position: Sitting, Cuff Size: Normal)   Pulse 72   Temp (!) 97.5 F (36.4 C) (Temporal)   Resp 18   Ht 5\' 2"  (1.575 m)   Wt 134 lb 9.6 oz (61.1 kg)   SpO2 95%   BMI 24.62 kg/m    Physical Exam Constitutional:      Appearance: She is normal weight.  HENT:     Head: Atraumatic.  Cardiovascular:     Rate and Rhythm: Normal rate and regular rhythm.     Heart sounds: Normal heart sounds.  Pulmonary:     Effort: Pulmonary effort is normal.     Breath sounds: Normal breath sounds.  Musculoskeletal:     Cervical back: Tenderness present.  Neurological:     General: No focal deficit present.     Mental Status: She is oriented to person, place, and time.      No results found for any visits on 05/28/22.    The ASCVD Risk score (Arnett DK, et al., 2019) failed to  calculate for the following reasons:   The 2019 ASCVD risk score is only valid for ages 4 to 59    Assessment & Plan:   Problem List Items Addressed This Visit   None   No follow-ups on file.    76, MD

## 2022-05-29 LAB — COMPREHENSIVE METABOLIC PANEL
ALT: 94 IU/L — ABNORMAL HIGH (ref 0–32)
AST: 92 IU/L — ABNORMAL HIGH (ref 0–40)
Albumin/Globulin Ratio: 1.7 (ref 1.2–2.2)
Albumin: 4.3 g/dL (ref 3.7–4.7)
Alkaline Phosphatase: 85 IU/L (ref 44–121)
BUN/Creatinine Ratio: 13 (ref 12–28)
BUN: 19 mg/dL (ref 8–27)
Bilirubin Total: 0.4 mg/dL (ref 0.0–1.2)
CO2: 21 mmol/L (ref 20–29)
Calcium: 8.7 mg/dL (ref 8.7–10.3)
Chloride: 106 mmol/L (ref 96–106)
Creatinine, Ser: 1.51 mg/dL — ABNORMAL HIGH (ref 0.57–1.00)
Globulin, Total: 2.5 g/dL (ref 1.5–4.5)
Glucose: 106 mg/dL — ABNORMAL HIGH (ref 70–99)
Potassium: 4.3 mmol/L (ref 3.5–5.2)
Sodium: 142 mmol/L (ref 134–144)
Total Protein: 6.8 g/dL (ref 6.0–8.5)
eGFR: 33 mL/min/{1.73_m2} — ABNORMAL LOW (ref >59)

## 2022-06-05 ENCOUNTER — Other Ambulatory Visit: Payer: Self-pay | Admitting: Cardiology

## 2022-06-05 DIAGNOSIS — I4819 Other persistent atrial fibrillation: Secondary | ICD-10-CM

## 2022-07-23 ENCOUNTER — Encounter: Payer: Self-pay | Admitting: Internal Medicine

## 2022-07-23 ENCOUNTER — Ambulatory Visit: Payer: Medicare HMO | Admitting: Internal Medicine

## 2022-07-23 VITALS — BP 110/80 | HR 65 | Temp 98.1°F | Resp 18 | Ht 62.0 in | Wt 133.2 lb

## 2022-07-23 DIAGNOSIS — H6592 Unspecified nonsuppurative otitis media, left ear: Secondary | ICD-10-CM

## 2022-07-23 DIAGNOSIS — M542 Cervicalgia: Secondary | ICD-10-CM | POA: Diagnosis not present

## 2022-07-23 MED ORDER — AMOXICILLIN-POT CLAVULANATE 875-125 MG PO TABS: 1.0000 | ORAL_TABLET | Freq: Two times a day (BID) | ORAL | 0 refills | Status: DC

## 2022-07-23 NOTE — Progress Notes (Signed)
   Acute Office Visit  Subjective:     Patient ID: Cheryl Mora, female    DOB: August 14, 1932, 89 y.o.   MRN: [id number]  Chief Complaint  Patient presents with   Headache    Headaches for a month    Headache  Associated symptoms include coughing and ear pain.   Patient is in today for pain around her left ear, post neck and right ear. She was treated for sinus congestion for 2 week and it is slowly getting better. She is also c/o pain in her neck, she also have cough but unable to bring secretion out.  Review of Systems  Constitutional: Negative.   HENT:  Positive for congestion and ear pain.   Respiratory:  Positive for cough.   Cardiovascular: Negative.   Neurological:  Positive for headaches.        Objective:    BP 110/80 (BP Location: Left Arm, Patient Position: Sitting, Cuff Size: Normal)   Pulse 65   Temp 98.1 F (36.7 C)   Resp 18   Ht 5\' 2"  (1.575 m)   Wt 133 lb 4 oz (60.4 kg)   SpO2 93%   BMI 24.37 kg/m    Physical Exam Constitutional:      Appearance: She is well-developed.  Cardiovascular:     Rate and Rhythm: Normal rate and regular rhythm.  Pulmonary:     Effort: Pulmonary effort is normal.     Breath sounds: Normal breath sounds.  Musculoskeletal:     Cervical back: Neck supple.  Neurological:     Mental Status: She is alert.     No results found for any visits on 07/23/22.      Assessment & Plan:   Problem List Items Addressed This Visit   None   No orders of the defined types were placed in this encounter.   No follow-ups on file.  Garwin Brothers, MD

## 2022-07-23 NOTE — Assessment & Plan Note (Signed)
I will give her NSAID along with tylenol arthritis.

## 2022-07-23 NOTE — Assessment & Plan Note (Signed)
I will give her augmentin 875 mg twice a day for 2 weeks and NSAID

## 2022-08-06 ENCOUNTER — Ambulatory Visit: Payer: Medicare HMO | Admitting: Internal Medicine

## 2022-08-09 ENCOUNTER — Other Ambulatory Visit: Payer: Self-pay

## 2022-08-09 MED ORDER — FLUTICASONE PROPIONATE 50 MCG/ACT NA SUSP: 1.0000 | Freq: Two times a day (BID) | NASAL | 6 refills | Status: DC | PRN

## 2022-08-09 MED ORDER — DORZOLAMIDE HCL-TIMOLOL MAL 2-0.5 % OP SOLN: 1.0000 [drp] | Freq: Two times a day (BID) | OPHTHALMIC | 2 refills | Status: DC

## 2022-08-13 ENCOUNTER — Encounter: Payer: Self-pay | Admitting: Internal Medicine

## 2022-08-13 ENCOUNTER — Ambulatory Visit: Payer: Medicare HMO | Admitting: Internal Medicine

## 2022-08-13 VITALS — BP 122/78 | HR 66 | Temp 97.0°F | Resp 18 | Ht 62.0 in | Wt 133.1 lb

## 2022-08-13 DIAGNOSIS — E0822 Diabetes mellitus due to underlying condition with diabetic chronic kidney disease: Secondary | ICD-10-CM | POA: Diagnosis not present

## 2022-08-13 DIAGNOSIS — R945 Abnormal results of liver function studies: Secondary | ICD-10-CM | POA: Diagnosis not present

## 2022-08-13 DIAGNOSIS — M542 Cervicalgia: Secondary | ICD-10-CM | POA: Diagnosis not present

## 2022-08-13 DIAGNOSIS — I10 Essential (primary) hypertension: Secondary | ICD-10-CM | POA: Diagnosis not present

## 2022-08-13 DIAGNOSIS — I480 Paroxysmal atrial fibrillation: Secondary | ICD-10-CM

## 2022-08-13 DIAGNOSIS — E785 Hyperlipidemia, unspecified: Secondary | ICD-10-CM | POA: Diagnosis not present

## 2022-08-13 DIAGNOSIS — N1831 Diabetes mellitus due to underlying condition with diabetic chronic kidney disease: Secondary | ICD-10-CM

## 2022-08-13 NOTE — Assessment & Plan Note (Signed)
I will repeat liver function today.

## 2022-08-13 NOTE — Assessment & Plan Note (Signed)
She will use volatren gel every 3-4 hours and use Tins pads.

## 2022-08-13 NOTE — Progress Notes (Signed)
Office Visit  Subjective   Patient ID: Cheryl Mora   DOB: [birth year]   Age: 89 y.o.   MRN: [id number]   Chief Complaint Chief Complaint  Patient presents with   Follow-up    3 month follow up     History of Present Illness 89 years old female is here for follow up. She says her cough is 90% better, she does not get SOB unless she walk quickly. She has chronic CHF with reduced EF and her symptoms are stable. She follow with cardiology every 6 months now.  On her last blood drawn her liver function was abnormal X 2. She has CKD and need to be monitored.     Her kidney function  and liver function was slightly worse with no obvious reason. I have discussed with her about.She has diabetes and hypertension, she check her blood pressure and sugar at home. She is due for HbA1c. She also see eye doctor every 3 months.. She also take statin and need repeat lipid panel today.    Past Medical History Past Medical History:  Diagnosis Date   Abnormal EKG    suggestive of previous anterior wall myocardial infarction   Atrial fibrillation (HCC)    On Eliquis   Diabetes mellitus (HCC)    Hypertension    Hypothyroidism      Allergies Allergies  Allergen Reactions   Amoxicillin-Pot Clavulanate Diarrhea and Other (See Comments)    Drowsy     Brimonidine Other (See Comments)    Conj follicles and injection    Lisinopril Swelling and Other (See Comments)    Angioedema welling lips, throat closing     Brimonidine Tartrate-Timolol Other (See Comments)    Redness in eye   Zolpidem Swelling    Lips swell     Review of Systems Review of Systems  Constitutional: Negative.   HENT: Negative.    Respiratory: Negative.    Cardiovascular: Negative.   Gastrointestinal: Negative.   Musculoskeletal:  Positive for neck pain.  Neurological: Negative.        Objective:    Vitals BP 122/78 (BP Location: Left Arm, Patient Position: Sitting, Cuff Size: Normal)   Pulse 66   Temp (!) 97  F (36.1 C)   Resp 18   Ht '5\' 2"'$  (1.575 m)   Wt 133 lb 2 oz (60.4 kg)   SpO2 97%   BMI 24.35 kg/m    Physical Examination Physical Exam Constitutional:      Appearance: Normal appearance.  HENT:     Head: Normocephalic and atraumatic.  Eyes:     Extraocular Movements: Extraocular movements intact.     Pupils: Pupils are equal, round, and reactive to light.  Cardiovascular:     Rate and Rhythm: Normal rate and regular rhythm.     Heart sounds: Normal heart sounds.  Pulmonary:     Effort: Pulmonary effort is normal.     Breath sounds: Normal breath sounds.  Abdominal:     General: Bowel sounds are normal.     Palpations: Abdomen is soft.  Neurological:     General: No focal deficit present.     Mental Status: She is alert and oriented to person, place, and time.        Assessment & Plan:   Diabetes mellitus due to underlying condition with stage 3a chronic kidney disease (HCC) Will do CMP, HBA1c, and lipid panel  Dyslipidemia Will do lipid panel  Abnormal liver function I will repeat liver  function today.    Return in about 3 months (around 11/13/2022).   Garwin Brothers, MD

## 2022-08-13 NOTE — Assessment & Plan Note (Signed)
Will do CMP, HBA1c, and lipid panel

## 2022-08-13 NOTE — Assessment & Plan Note (Signed)
Will do lipid panel

## 2022-09-03 ENCOUNTER — Ambulatory Visit: Payer: Medicare HMO | Admitting: Internal Medicine

## 2022-09-19 ENCOUNTER — Other Ambulatory Visit: Payer: Self-pay | Admitting: Cardiology

## 2022-09-19 DIAGNOSIS — I4891 Unspecified atrial fibrillation: Secondary | ICD-10-CM

## 2022-09-19 NOTE — Telephone Encounter (Signed)
Eliquis 5mg  refill request received. Patient is 89 years old, weight-60.4kg, Crea-1.51 on 05/28/22, Diagnosis-Afib, and last seen by Dr. Cristal Deer on 05/21/22. Dose is inappropriate based on dosing criteria. Will ask PharmD about dose reduction.

## 2022-09-19 NOTE — Telephone Encounter (Signed)
Please review for refill. Thank you! 

## 2022-09-20 NOTE — Telephone Encounter (Signed)
Per PharmD Thayer Ohm, refill eliquis 5mg  bid x 90 days (refer to 09/20/10 refill note).   Eliquis 5mg  refill request received. Patient is 89 years old, weight-60.4kg, Crea-1.51 on 05/28/22, Diagnosis-Afib, and last seen by Dr. Cristal Deer on 05/21/22.

## 2022-09-20 NOTE — Telephone Encounter (Signed)
Recommend keeping patient on 5mg  BID. However only fill for 90 days. Weight is also borderline.

## 2022-10-01 ENCOUNTER — Other Ambulatory Visit: Payer: Self-pay | Admitting: Internal Medicine

## 2022-10-01 ENCOUNTER — Ambulatory Visit: Payer: Medicare HMO | Admitting: Internal Medicine

## 2022-10-01 DIAGNOSIS — I10 Essential (primary) hypertension: Secondary | ICD-10-CM | POA: Diagnosis not present

## 2022-10-02 LAB — CBC WITH DIFFERENTIAL/PLATELET
Basophils Absolute: 0.1 10*3/uL (ref 0.0–0.2)
Basos: 1 %
EOS (ABSOLUTE): 0 10*3/uL (ref 0.0–0.4)
Eos: 0 %
Hematocrit: 38.4 % (ref 34.0–46.6)
Hemoglobin: 11.8 g/dL (ref 11.1–15.9)
Immature Grans (Abs): 0 10*3/uL (ref 0.0–0.1)
Immature Granulocytes: 0 %
Lymphocytes Absolute: 1.4 10*3/uL (ref 0.7–3.1)
Lymphs: 20 %
MCH: 29.1 pg (ref 26.6–33.0)
MCHC: 30.7 g/dL — ABNORMAL LOW (ref 31.5–35.7)
MCV: 95 fL (ref 79–97)
Monocytes Absolute: 0.5 10*3/uL (ref 0.1–0.9)
Monocytes: 8 %
Neutrophils Absolute: 4.8 10*3/uL (ref 1.4–7.0)
Neutrophils: 71 %
Platelets: 222 10*3/uL (ref 150–450)
RBC: 4.05 x10E6/uL (ref 3.77–5.28)
RDW: 14.1 % (ref 11.7–15.4)
WBC: 6.8 10*3/uL (ref 3.4–10.8)

## 2022-10-02 LAB — COMPREHENSIVE METABOLIC PANEL
ALT: 85 IU/L — ABNORMAL HIGH (ref 0–32)
AST: 62 IU/L — ABNORMAL HIGH (ref 0–40)
Albumin/Globulin Ratio: 1.7 (ref 1.2–2.2)
Albumin: 4.3 g/dL (ref 3.7–4.7)
Alkaline Phosphatase: 89 IU/L (ref 44–121)
BUN/Creatinine Ratio: 12 (ref 12–28)
BUN: 18 mg/dL (ref 8–27)
Bilirubin Total: 0.4 mg/dL (ref 0.0–1.2)
CO2: 15 mmol/L — ABNORMAL LOW (ref 20–29)
Calcium: 8.7 mg/dL (ref 8.7–10.3)
Chloride: 104 mmol/L (ref 96–106)
Creatinine, Ser: 1.5 mg/dL — ABNORMAL HIGH (ref 0.57–1.00)
Globulin, Total: 2.6 g/dL (ref 1.5–4.5)
Glucose: 188 mg/dL — ABNORMAL HIGH (ref 70–99)
Potassium: 5.3 mmol/L — ABNORMAL HIGH (ref 3.5–5.2)
Sodium: 140 mmol/L (ref 134–144)
Total Protein: 6.9 g/dL (ref 6.0–8.5)
eGFR: 34 mL/min/{1.73_m2} — ABNORMAL LOW (ref >59)

## 2022-10-25 NOTE — Progress Notes (Unsigned)
Labs

## 2022-11-05 ENCOUNTER — Ambulatory Visit: Payer: Medicare HMO | Admitting: Internal Medicine

## 2022-11-05 ENCOUNTER — Encounter: Payer: Self-pay | Admitting: Internal Medicine

## 2022-11-05 ENCOUNTER — Other Ambulatory Visit (HOSPITAL_BASED_OUTPATIENT_CLINIC_OR_DEPARTMENT_OTHER): Payer: Self-pay | Admitting: Cardiology

## 2022-11-05 VITALS — BP 142/82 | HR 64 | Temp 97.9°F | Resp 18 | Ht 62.0 in | Wt 133.0 lb

## 2022-11-05 DIAGNOSIS — R519 Headache, unspecified: Secondary | ICD-10-CM | POA: Insufficient documentation

## 2022-11-05 NOTE — Progress Notes (Signed)
   Acute Office Visit  Subjective:     Patient ID: Cheryl Mora, female    DOB: 1932-11-29, 89 y.o.   MRN: [id number]  Chief Complaint  Patient presents with   office visit     Severe headaches at night     HPI Patient is in today for severe headache and woke up with it. She lately has increase headache back of her head and has been using tylenol without any relief. She says that she never woke up with severe headache that has happened today. No nausea, she still has headache but not that severe. No photophobia. No nausea or vomiting.   Review of Systems  Constitutional: Negative.   HENT: Negative.    Cardiovascular: Negative.   Neurological:  Positive for headaches.        Objective:    BP (!) 142/82 (BP Location: Left Arm, Patient Position: Sitting, Cuff Size: Normal)   Pulse 64   Temp 97.9 F (36.6 C)   Resp 18   Ht 5\' 2"  (1.575 m)   Wt 133 lb (60.3 kg)   SpO2 99%   BMI 24.33 kg/m    Physical Exam Constitutional:      Appearance: Normal appearance.  HENT:     Head: Normocephalic and atraumatic.  Cardiovascular:     Rate and Rhythm: Normal rate and regular rhythm.     Heart sounds: Normal heart sounds.  Pulmonary:     Effort: Pulmonary effort is normal.     Breath sounds: Normal breath sounds.  Neurological:     General: No focal deficit present.     Mental Status: She is alert.     No results found for any visits on 11/05/22.      Assessment & Plan:   Problem List Items Addressed This Visit       Other   Headache - Primary    As she is on eliquis 5 mg twice a day for PAF, with severe headache I will do stat CT head without contrast. I have advised her to hold eliquis until CT scan result.       No orders of the defined types were placed in this encounter.   No follow-ups on file.  Eloisa Northern, MD

## 2022-11-05 NOTE — Telephone Encounter (Signed)
Rx request sent to pharmacy.  

## 2022-11-05 NOTE — Assessment & Plan Note (Signed)
As she is on eliquis 5 mg twice a day for PAF, with severe headache I will do stat CT head without contrast. I have advised her to hold eliquis until CT scan result.

## 2022-11-06 ENCOUNTER — Emergency Department (HOSPITAL_COMMUNITY): Payer: Medicare HMO

## 2022-11-06 ENCOUNTER — Ambulatory Visit (HOSPITAL_BASED_OUTPATIENT_CLINIC_OR_DEPARTMENT_OTHER): Admission: RE | Admit: 2022-11-06 | Payer: Medicare HMO | Source: Ambulatory Visit

## 2022-11-06 ENCOUNTER — Other Ambulatory Visit (HOSPITAL_BASED_OUTPATIENT_CLINIC_OR_DEPARTMENT_OTHER): Payer: Self-pay

## 2022-11-06 ENCOUNTER — Encounter (HOSPITAL_BASED_OUTPATIENT_CLINIC_OR_DEPARTMENT_OTHER): Payer: Self-pay

## 2022-11-06 ENCOUNTER — Other Ambulatory Visit: Payer: Self-pay

## 2022-11-06 ENCOUNTER — Emergency Department (HOSPITAL_BASED_OUTPATIENT_CLINIC_OR_DEPARTMENT_OTHER): Payer: Medicare HMO

## 2022-11-06 ENCOUNTER — Encounter (HOSPITAL_BASED_OUTPATIENT_CLINIC_OR_DEPARTMENT_OTHER): Payer: Self-pay | Admitting: Emergency Medicine

## 2022-11-06 ENCOUNTER — Emergency Department (HOSPITAL_BASED_OUTPATIENT_CLINIC_OR_DEPARTMENT_OTHER)
Admission: EM | Admit: 2022-11-06 | Discharge: 2022-11-06 | Disposition: A | Discharge status: Home / Self Care | Payer: Medicare HMO | Attending: Emergency Medicine | Admitting: Emergency Medicine

## 2022-11-06 ENCOUNTER — Emergency Department (HOSPITAL_BASED_OUTPATIENT_CLINIC_OR_DEPARTMENT_OTHER): Admission: RE | Admit: 2022-11-06 | Payer: Medicare HMO | Source: Ambulatory Visit

## 2022-11-06 DIAGNOSIS — R42 Dizziness and giddiness: Secondary | ICD-10-CM | POA: Diagnosis not present

## 2022-11-06 DIAGNOSIS — D329 Benign neoplasm of meninges, unspecified: Secondary | ICD-10-CM | POA: Insufficient documentation

## 2022-11-06 DIAGNOSIS — E1122 Type 2 diabetes mellitus with diabetic chronic kidney disease: Secondary | ICD-10-CM | POA: Insufficient documentation

## 2022-11-06 DIAGNOSIS — Z7984 Long term (current) use of oral hypoglycemic drugs: Secondary | ICD-10-CM | POA: Diagnosis not present

## 2022-11-06 DIAGNOSIS — Z79899 Other long term (current) drug therapy: Secondary | ICD-10-CM | POA: Insufficient documentation

## 2022-11-06 DIAGNOSIS — I480 Paroxysmal atrial fibrillation: Secondary | ICD-10-CM | POA: Insufficient documentation

## 2022-11-06 DIAGNOSIS — I6789 Other cerebrovascular disease: Secondary | ICD-10-CM | POA: Diagnosis not present

## 2022-11-06 DIAGNOSIS — I10 Essential (primary) hypertension: Secondary | ICD-10-CM | POA: Diagnosis not present

## 2022-11-06 DIAGNOSIS — R519 Headache, unspecified: Secondary | ICD-10-CM | POA: Insufficient documentation

## 2022-11-06 DIAGNOSIS — Z7901 Long term (current) use of anticoagulants: Secondary | ICD-10-CM | POA: Diagnosis not present

## 2022-11-06 DIAGNOSIS — I5021 Acute systolic (congestive) heart failure: Secondary | ICD-10-CM | POA: Diagnosis not present

## 2022-11-06 DIAGNOSIS — I130 Hypertensive heart and chronic kidney disease with heart failure and stage 1 through stage 4 chronic kidney disease, or unspecified chronic kidney disease: Secondary | ICD-10-CM | POA: Insufficient documentation

## 2022-11-06 DIAGNOSIS — G9389 Other specified disorders of brain: Secondary | ICD-10-CM | POA: Diagnosis not present

## 2022-11-06 DIAGNOSIS — E039 Hypothyroidism, unspecified: Secondary | ICD-10-CM | POA: Diagnosis not present

## 2022-11-06 DIAGNOSIS — N1831 Chronic kidney disease, stage 3a: Secondary | ICD-10-CM | POA: Insufficient documentation

## 2022-11-06 LAB — COMPREHENSIVE METABOLIC PANEL
ALT: 57 U/L — ABNORMAL HIGH (ref 0–44)
AST: 40 U/L (ref 15–41)
Albumin: 4.3 g/dL (ref 3.5–5.0)
Alkaline Phosphatase: 68 U/L (ref 38–126)
Anion gap: 13 (ref 5–15)
BUN: 15 mg/dL (ref 8–23)
CO2: 23 mmol/L (ref 22–32)
Calcium: 8.6 mg/dL — ABNORMAL LOW (ref 8.9–10.3)
Chloride: 104 mmol/L (ref 98–111)
Creatinine, Ser: 1.45 mg/dL — ABNORMAL HIGH (ref 0.44–1.00)
GFR, Estimated: 35 mL/min — ABNORMAL LOW (ref >60)
Glucose, Bld: 122 mg/dL — ABNORMAL HIGH (ref 70–99)
Potassium: 3.5 mmol/L (ref 3.5–5.1)
Sodium: 140 mmol/L (ref 135–145)
Total Bilirubin: 0.7 mg/dL (ref 0.3–1.2)
Total Protein: 7.2 g/dL (ref 6.5–8.1)

## 2022-11-06 LAB — CBC WITH DIFFERENTIAL/PLATELET
Abs Immature Granulocytes: 0.02 10*3/uL (ref 0.00–0.07)
Basophils Absolute: 0 10*3/uL (ref 0.0–0.1)
Basophils Relative: 0 %
Eosinophils Absolute: 0 10*3/uL (ref 0.0–0.5)
Eosinophils Relative: 0 %
HCT: 37.8 % (ref 36.0–46.0)
Hemoglobin: 11.8 g/dL — ABNORMAL LOW (ref 12.0–15.0)
Immature Granulocytes: 0 %
Lymphocytes Relative: 17 %
Lymphs Abs: 1.1 10*3/uL (ref 0.7–4.0)
MCH: 28.6 pg (ref 26.0–34.0)
MCHC: 31.2 g/dL (ref 30.0–36.0)
MCV: 91.5 fL (ref 80.0–100.0)
Monocytes Absolute: 0.5 10*3/uL (ref 0.1–1.0)
Monocytes Relative: 7 %
Neutro Abs: 5.1 10*3/uL (ref 1.7–7.7)
Neutrophils Relative %: 76 %
Platelets: 215 10*3/uL (ref 150–400)
RBC: 4.13 MIL/uL (ref 3.87–5.11)
RDW: 15.3 % (ref 11.5–15.5)
WBC: 6.8 10*3/uL (ref 4.0–10.5)
nRBC: 0 % (ref 0.0–0.2)

## 2022-11-06 LAB — PROTIME-INR
INR: 1.2 (ref 0.8–1.2)
Prothrombin Time: 15 seconds (ref 11.4–15.2)

## 2022-11-06 LAB — C-REACTIVE PROTEIN: CRP: <0.5 mg/dL (ref <1.0)

## 2022-11-06 LAB — SEDIMENTATION RATE: Sed Rate: 28 mm/hr — ABNORMAL HIGH (ref 0–22)

## 2022-11-06 MED ORDER — HYDRALAZINE HCL 25 MG PO TABS
50.0000 mg | ORAL_TABLET | Freq: Once | ORAL | Status: AC
  Administered 2022-11-06: 50 mg via ORAL
  Filled 2022-11-06: qty 2

## 2022-11-06 MED ORDER — GADOBUTROL 1 MMOL/ML IV SOLN
6.0000 mL | Freq: Once | INTRAVENOUS | Status: AC | PRN
  Administered 2022-11-06: 6 mL via INTRAVENOUS

## 2022-11-06 MED ORDER — ACETAMINOPHEN 500 MG PO TABS
1000.0000 mg | ORAL_TABLET | Freq: Once | ORAL | Status: AC
  Administered 2022-11-06: 1000 mg via ORAL
  Filled 2022-11-06: qty 2

## 2022-11-06 NOTE — ED Triage Notes (Signed)
Pt caox4, ambulatory, NAD. Pt c/o headache described as pressure x3 wks that severely worsened over the past 2 days with dizziness. Pt denies any recent injury or hitting her head. Pt takes eliquis and was told by PCP to stop taking d/t s/s so last dose was yesterday morning.

## 2022-11-06 NOTE — Addendum Note (Signed)
Addended by: Priscille Heidelberg on: 11/06/2022 09:44 AM   Modules accepted: Orders

## 2022-11-06 NOTE — ED Provider Notes (Signed)
Pt transferred from [facility] for MRIs.  1. Two small dural-based enhancing masses, consistent with  meningiomas, without evidence of significant mass effect on the  underlying parenchyma.  2. No acute intracranial process. No evidence of acute or subacute  infarct.  3. No intracranial large vessel occlusion or significant stenosis.  4. Irregularity of the distal extracranial segments of the bilateral  internal carotid arteries, which could be artifactual but could also  represent beading, as can be seen with fibromuscular dysplasia. If  there is clinical concern, a CTA neck could be obtained.  5. No evidence of venous sinus or cortical vein stenosis or  thrombosis.   Pt is very anxious to go home.  She is stable for d/c.  Return if worse.  F/u with pcp.     Cheryl Lefevre, MD 11/06/22 2118

## 2022-11-06 NOTE — Plan of Care (Addendum)
  These are curbside recommendations based upon the information readily available in the chart on brief review as well as history and examination information provided to me by requesting provider and do not replace a full detailed consult  89 year old with a past medical history significant for type 2 diabetes, hypertension (labile, possibly most recently on 5 mg amlodipine per cardiology note, but possibly increased to 10 mg recently based on EMR), hyperlipidemia, hypothyroidism, paroxysmal atrial fibrillation on Eliquis (stopped 5/28 evening by her PCP due to headache pending head CT)  Her headache has been gradual in onset but worsening the past couple of days, possibly most noticeable in the morning when she wakes and improving over the course of the day based on chart review.  Per ED provider no other focal neurological signs or symptoms and neurological examination is benign  Head CT with 2 likely meningiomas but otherwise unremarkable, personally reviewed, agree with radiology, but there is some microvascular changes that can mask acute findings  Given her age, positional headache, and comorbidities I would get MRI brain with and without contrast, MRA head without contrast, MRV; though this may be secondary to hypertensive urgency/emergency here today, she was notably not very hypertensive with her PCP yesterday.  If imaging is reassuring she can follow-up outpatient with return precautions.  If there are significant findings on imaging please reach out to neurology for further recommendations   Current vital signs: BP (!) 194/75   Pulse 64   Temp 97.8 F (36.6 C) (Oral)   Resp 18   Ht 5\' 2"  (1.575 m)   Wt 60.3 kg   SpO2 98%   BMI 24.33 kg/m  Vital signs in last 24 hours: Temp:  [97.8 F (36.6 C)-97.9 F (36.6 C)] 97.8 F (36.6 C) (05/29 1021) Pulse Rate:  [59-64] 64 (05/29 1330) Resp:  [16-23] 18 (05/29 1330) BP: (142-223)/(69-88) 194/75 (05/29 1330) SpO2:  [97 %-100 %] 98 %  (05/29 1330) Weight:  [60.3 kg] 60.3 kg (05/29 1022)   Basic Metabolic Panel: Recent Labs  Lab 11/06/22 1158  NA 140  K 3.5  CL 104  CO2 23  GLUCOSE 122*  BUN 15  CREATININE 1.45*  CALCIUM 8.6*  GFR > 30 (Cr at her baseline)  CBC: Recent Labs  Lab 11/06/22 1158  WBC 6.8  NEUTROABS 5.1  HGB 11.8*  HCT 37.8  MCV 91.5  PLT 215    Coagulation Studies: Recent Labs    11/06/22 1158  LABPROT 15.0  INR 1.2    Approximately 15 minutes of time were spent reviewing the case and discussion with Dr. Suezanne Jacquet, > 50% of which was spent in discussion.

## 2022-11-06 NOTE — ED Notes (Signed)
Pt. In MRI. Unable to recollect vitals

## 2022-11-06 NOTE — ED Notes (Signed)
Patient transported to CT 

## 2022-11-06 NOTE — ED Provider Notes (Signed)
[place] EMERGENCY DEPARTMENT AT [facility] Provider Note  CSN: [id number] Arrival date & time: 11/06/22 1010  Chief Complaint(s) Headache  HPI Cheryl Mora is a 89 y.o. female with history of A-fib, diabetes, hypertension, presenting to the emergency department with headache.  Patient reports headache for the past 3 to weeks to few months, which has been worsening.  She reports over the past 2 days it has been much worse.  No head injury.  No nausea or vomiting.  No fevers or chills.  No chest pain or shortness of breath.  No numbness or tingling, weakness.  No vision changes.  No trouble swallowing, trouble speaking.  Symptoms are constant.  She reports the headache is primarily over the left side of her head   Past Medical History Past Medical History:  Diagnosis Date   Abnormal EKG    suggestive of previous anterior wall myocardial infarction   Atrial fibrillation (HCC)    On Eliquis   Diabetes mellitus (HCC)    Hypertension    Hypothyroidism    Patient Active Problem List   Diagnosis Date Noted   Headache 11/05/2022   Neck pain on left side 05/28/2022   Abnormal liver function 05/28/2022   Left otitis media with effusion 05/14/2022   Diabetes mellitus due to underlying condition with stage 3a chronic kidney disease (HCC) 05/14/2022   Aspiration pneumonia (HCC) 02/22/2022   PAF (paroxysmal atrial fibrillation) (HCC) 02/22/2022   AKI (acute kidney injury) (HCC) 02/20/2022   Acute hypoxemic respiratory failure (HCC) 02/16/2022   Persistent atrial fibrillation (HCC)    Acute systolic heart failure (HCC) 03/07/2020   Dyspnea on exertion 03/06/2020   Dyslipidemia 03/06/2020   Type 2 diabetes mellitus without complication, without long-term current use of insulin (HCC) 03/06/2020   Benign essential HTN 03/06/2020   NICM (nonischemic cardiomyopathy) (HCC) 03/06/2020   Atrial fibrillation with RVR (HCC) 03/06/2020   Home Medication(s) Prior to Admission  medications   Medication Sig Start Date End Date Taking? Authorizing Provider  amiodarone (PACERONE) 200 MG tablet TAKE 1 TABLET EVERY DAY 06/06/22   Jodelle Red, MD  amoxicillin-clavulanate (AUGMENTIN) 875-125 MG tablet Take 1 tablet by mouth 2 (two) times daily. 07/23/22   Eloisa Northern, MD  apixaban (ELIQUIS) 5 MG TABS tablet TAKE 1 TABLET TWICE DAILY 09/20/22   Jodelle Red, MD  ascorbic acid (VITAMIN C) 250 MG CHEW Chew 250 mg by mouth 2 (two) times daily.    [provider]  calcium carbonate (TUMS - DOSED IN MG ELEMENTAL CALCIUM) 500 MG chewable tablet Chew 1-2 tablets by mouth daily as needed for indigestion or heartburn.    [provider]  Calcium-Magnesium-Zinc [phone] MG TABS Take 1 tablet by mouth daily.    [provider]  cetirizine (ZYRTEC) 10 MG tablet Take 10 mg by mouth daily as needed for allergies.    [provider]  diclofenac Sodium (VOLTAREN) 1 % GEL Apply 2 g topically 4 (four) times daily. 05/28/22   Eloisa Northern, MD  diphenhydramine-acetaminophen (TYLENOL PM) 25-500 MG TABS tablet Take 1 tablet by mouth at bedtime as needed (sleep).    [provider]  dorzolamide-timolol (COSOPT) 2-0.5 % ophthalmic solution Place 1 drop into the left eye 2 (two) times daily. 08/09/22   Eloisa Northern, MD  fluticasone (FLONASE) 50 MCG/ACT nasal spray Place 1 spray into both nostrils 2 (two) times daily as needed for allergies. 08/09/22   Eloisa Northern, MD  levothyroxine (SYNTHROID) 75 MCG tablet Take 75 mcg  by mouth daily before breakfast. 01/24/21   [provider]  melatonin 5 MG TABS Take 5 mg by mouth at bedtime as needed (sleep).    [provider]  Menthol-Methyl Salicylate (SALONPAS PAIN RELIEF PATCH EX) Apply 1 application topically daily as needed (pain).    [provider]  metFORMIN (GLUCOPHAGE-XR) 500 MG 24 hr tablet Take 500 mg by mouth 2 (two) times daily. 04/12/22   [provider]   nitroGLYCERIN (NITROSTAT) 0.4 MG SL tablet Place 1 tablet (0.4 mg total) under the tongue every 5 (five) minutes as needed for chest pain. 03/02/20   Jodelle Red, MD  Polyethyl Glycol-Propyl Glycol (SYSTANE OP) Place 1 drop into both eyes daily as needed (dryness).    [provider]  rosuvastatin (CRESTOR) 20 MG tablet Take 1 tablet (20 mg total) by mouth daily. 11/05/22   Jodelle Red, MD  vitamin B-12 (CYANOCOBALAMIN) 100 MCG tablet Take 100 mcg by mouth daily.    [provider]                                                                                                                                    Past Surgical History Past Surgical History:  Procedure Laterality Date   ABDOMINAL HYSTERECTOMY     CARDIOVERSION N/A 04/13/2020   Procedure: CARDIOVERSION;  Surgeon: [name], MD;  Location: [facility] ENDOSCOPY;  Service: Cardiovascular;  Laterality: N/A;   CATARACT EXTRACTION     KNEE SURGERY     LEFT HEART CATH AND CORONARY ANGIOGRAPHY N/A 03/06/2020   Procedure: LEFT HEART CATH AND CORONARY ANGIOGRAPHY;  Surgeon: [place], Peter M, MD;  Location: [facility] INVASIVE CV LAB;  Service: Cardiovascular;  Laterality: N/A;   Family History Family History  Problem Relation Age of Onset   Hypertension Mother    Stroke Mother    Cirrhosis Mother    Heart attack Other     Social History Social History   Tobacco Use   Smoking status: Never   Smokeless tobacco: Never  Vaping Use   Vaping Use: Never used  Substance Use Topics   Alcohol use: Yes    Alcohol/week: 4.0 standard drinks of alcohol    Types: 2 Glasses of wine, 2 Shots of liquor per week    Comment: several a day   Drug use: Never   Allergies Amoxicillin-pot clavulanate, Brimonidine, Lisinopril, Brimonidine tartrate-timolol, and Zolpidem  Review of Systems Review of Systems  All other systems reviewed and are negative.   Physical Exam Vital Signs  I have reviewed the triage vital  signs BP (!) 155/82 (BP Location: Right Arm)   Pulse 88   Temp (!) 96.8 F (36 C) (Axillary)   Resp 18   Ht 5\' 2"  (1.575 m)   Wt 60.3 kg   SpO2 98%   BMI 24.33 kg/m  Physical Exam Vitals and nursing note reviewed.  Constitutional:  General: She is not in acute distress.    Appearance: She is well-developed.  HENT:     Head: Normocephalic and atraumatic.     Comments: No temporal artery tenderness    Mouth/Throat:     Mouth: Mucous membranes are moist.  Eyes:     Pupils: Pupils are equal, round, and reactive to light.  Cardiovascular:     Rate and Rhythm: Normal rate and regular rhythm.     Heart sounds: No murmur heard. Pulmonary:     Effort: Pulmonary effort is normal. No respiratory distress.     Breath sounds: Normal breath sounds.  Abdominal:     General: Abdomen is flat.     Palpations: Abdomen is soft.     Tenderness: There is no abdominal tenderness.  Musculoskeletal:        General: No tenderness.     Cervical back: Normal range of motion and neck supple.     Right lower leg: No edema.     Left lower leg: No edema.  Skin:    General: Skin is warm and dry.  Neurological:     General: No focal deficit present.     Mental Status: She is alert. Mental status is at baseline.     Comments: Cranial nerves II through XII intact, strength 5 out of 5 in the bilateral upper and lower extremities, no sensory deficit to light touch, no dysmetria on finger-nose-finger testing  Psychiatric:        Mood and Affect: Mood normal.        Behavior: Behavior normal.     ED Results and Treatments Labs (all labs ordered are listed, but only abnormal results are displayed) Labs Reviewed  COMPREHENSIVE METABOLIC PANEL - Abnormal; Notable for the following components:      Result Value   Glucose, Bld 122 (*)    Creatinine, Ser 1.45 (*)    Calcium 8.6 (*)    ALT 57 (*)    GFR, Estimated 35 (*)    All other components within normal limits  CBC WITH DIFFERENTIAL/PLATELET  - Abnormal; Notable for the following components:   Hemoglobin 11.8 (*)    All other components within normal limits  SEDIMENTATION RATE - Abnormal; Notable for the following components:   Sed Rate 28 (*)    All other components within normal limits  PROTIME-INR  C-REACTIVE PROTEIN                                                                                                                          Radiology CT Head Wo Contrast  Result Date: 11/06/2022 CLINICAL DATA:  89 year old female with headache and dizziness. On Eliquis. Progressed symptoms over the past 2 days, denies injury. EXAM: CT HEAD WITHOUT CONTRAST TECHNIQUE: Contiguous axial images were obtained from the base of the skull through the vertex without intravenous contrast. RADIATION DOSE REDUCTION: This exam was performed according to the departmental dose-optimization program which includes automated exposure control, adjustment  of the mA and/or kV according to patient size and/or use of iterative reconstruction technique. COMPARISON:  None Available. FINDINGS: Brain: Calcified (200 Hounsfield unit) dural based or peripheral 10 mm oval and rounded hyperdense area at the left occipital pole on series 2, image 15 is nonspecific but probably a small meningioma. There is a 2nd small area of peripheral or dural based calcification measuring 6 mm at the contralateral right parietal convexity (series 2, image 19). No cerebral edema or significant mass effect at either area. No other intracranial mass lesion. No midline shift. No convincing intracranial hemorrhage; small bilateral basal ganglia calcifications also noted. No ventriculomegaly. Normal basilar cisterns. Patchy and confluent bilateral white matter hypodensity is mild to moderate for age. No cortically based acute infarct identified. No cortical encephalomalacia identified. Vascular: No suspicious intracranial vascular hyperdensity. Calcified atherosclerosis at the skull base. Skull:  Negative. Sinuses/Orbits: Largely clear visible paranasal sinuses, tympanic cavities and mastoids. Mild posterior left ethmoid opacification. No sinus fluid levels. Other: Postoperative changes to both globes. No acute orbit or scalp soft tissue finding. IMPRESSION: 1. Evidence of a small 10 mm partially calcified Meningioma at the left occipital pole. Possible 2nd small calcified meningioma along the right lateral convexity (vs. dural calcification). No associated cerebral edema or mass effect. 2. No superimposed acute intracranial abnormality; mild to moderate for age cerebral white matter changes, most commonly due to chronic small vessel disease. Electronically Signed   By: Odessa Fleming M.D.   On: 11/06/2022 11:25    Pertinent labs & imaging results that were available during my care of the patient were reviewed by me and considered in my medical decision making (see MDM for details).  Medications Ordered in ED Medications  acetaminophen (TYLENOL) tablet 1,000 mg (1,000 mg Oral Given 11/06/22 1145)  hydrALAZINE (APRESOLINE) tablet 50 mg (50 mg Oral Given 11/06/22 1241)                                                                                                                                     Procedures Procedures  (including critical care time)  Medical Decision Making / ED Course   MDM:  89 year old female presenting to the emergency department headache.  Patient has normal neurologic exam.  Vital signs notable for hypertension.  Differential includes intracranial bleeding, mass occupying lesion.  Less likely subarachnoid hemorrhage as the patient has had worsening headache for some time.  Doubt meningitis without fevers or chills and length of symptoms.  Doubt venous sinus thrombosis as patient is compliant with Eliquis.  Given age differential obtain imaging as well as labs.  Less likely temporal arteritis without vision changes or associated symptoms, but patient does localize her  pain over the temporal area, although she has no temporal artery tenderness.  Will check ESR.  Clinical Course as of 11/06/22 1507  Wed Nov 06, 2022  1504 CT head unremarkable.  Her headache resolved after Tylenol.  She does have a mildly elevated ESR but I think her symptoms are less likely due to temporal arteritis.  Discussed with neurology given age and meningiomas, given that her headache was a lot worse over the past couple days they would recommend obtaining MRIs including MRI brain, MRA w/o, MRV brain. Dr. Durwin Nora will accept ER to ER transfer. Given resolution of headache, reassuring labs and improving blood pressure patient will go via [facility] for [facility]. [WS]    Clinical Course User Index [WS] Suezanne Jacquet, Jerilee Field, MD     Additional history obtained: -Additional history obtained from family -External records from outside source obtained and reviewed including: Chart review including previous notes, labs, imaging, consultation notes including PMD notes   Lab Tests: -I ordered, reviewed, and interpreted labs.   The pertinent results include:   Labs Reviewed  COMPREHENSIVE METABOLIC PANEL - Abnormal; Notable for the following components:      Result Value   Glucose, Bld 122 (*)    Creatinine, Ser 1.45 (*)    Calcium 8.6 (*)    ALT 57 (*)    GFR, Estimated 35 (*)    All other components within normal limits  CBC WITH DIFFERENTIAL/PLATELET - Abnormal; Notable for the following components:   Hemoglobin 11.8 (*)    All other components within normal limits  SEDIMENTATION RATE - Abnormal; Notable for the following components:   Sed Rate 28 (*)    All other components within normal limits  PROTIME-INR  C-REACTIVE PROTEIN    Notable for CKD, very mild anemia  EKG   EKG Interpretation  Date/Time:  Wednesday Nov 06 2022 10:22:45 EDT Ventricular Rate:  62 PR Interval:  212 QRS Duration: 103 QT Interval:  478 QTC Calculation: 486 R Axis:   -68 Text Interpretation: Sinus  rhythm Left anterior fascicular block Anteroseptal infarct, age indeterminate Confirmed by Alvino Blood ([id number]) on 11/06/2022 10:48:09 AM         Imaging Studies ordered: I ordered imaging studies including CT head On my interpretation imaging demonstrates no acute process, few meningiomas  I independently visualized and interpreted imaging. I agree with the radiologist interpretation   Medicines ordered and prescription drug management: Meds ordered this encounter  Medications   acetaminophen (TYLENOL) tablet 1,000 mg   hydrALAZINE (APRESOLINE) tablet 50 mg    -I have reviewed the patients home medicines and have made adjustments as needed   Consultations Obtained: I requested consultation with the neurologist,  and discussed lab and imaging findings as well as pertinent plan - they recommend: obtain MRI brain   Reevaluation: After the interventions noted above, I reevaluated the patient and found that their symptoms have resolved  Co morbidities that complicate the patient evaluation  Past Medical History:  Diagnosis Date   Abnormal EKG    suggestive of previous anterior wall myocardial infarction   Atrial fibrillation (HCC)    On Eliquis   Diabetes mellitus (HCC)    Hypertension    Hypothyroidism       Dispostion: Disposition decision including need for hospitalization was considered, and patient transferred.    Final Clinical Impression(s) / ED Diagnoses Final diagnoses:  Bad headache     This chart was dictated using voice recognition software.  Despite best efforts to proofread,  errors can occur which can change the documentation meaning.    Lonell Grandchild, MD 11/06/22 [phone]

## 2022-11-12 ENCOUNTER — Encounter: Payer: Self-pay | Admitting: Internal Medicine

## 2022-11-12 ENCOUNTER — Ambulatory Visit: Payer: Medicare HMO | Admitting: Internal Medicine

## 2022-11-12 VITALS — BP 148/76 | HR 65 | Temp 97.7°F | Resp 18 | Ht 62.0 in | Wt 134.2 lb

## 2022-11-12 DIAGNOSIS — F419 Anxiety disorder, unspecified: Secondary | ICD-10-CM

## 2022-11-12 DIAGNOSIS — E0822 Diabetes mellitus due to underlying condition with diabetic chronic kidney disease: Secondary | ICD-10-CM | POA: Diagnosis not present

## 2022-11-12 DIAGNOSIS — I10 Essential (primary) hypertension: Secondary | ICD-10-CM

## 2022-11-12 DIAGNOSIS — M542 Cervicalgia: Secondary | ICD-10-CM

## 2022-11-12 DIAGNOSIS — I480 Paroxysmal atrial fibrillation: Secondary | ICD-10-CM | POA: Diagnosis not present

## 2022-11-12 DIAGNOSIS — N1831 Chronic kidney disease, stage 3a: Secondary | ICD-10-CM | POA: Diagnosis not present

## 2022-11-12 MED ORDER — LORAZEPAM 0.5 MG PO TABS: 0.5000 mg | ORAL_TABLET | Freq: Once | ORAL | 0 refills | Status: AC

## 2022-11-12 NOTE — Progress Notes (Signed)
Office Visit  Subjective   Patient ID: Kameryn Hertzler   DOB: 17-Feb-1933   Age: 89 y.o.   MRN: [id number]   Chief Complaint Chief Complaint  Patient presents with   office visit    Discuss results     History of Present Illness 89 years old female is for follow up from ED visit. She has headache and was sent c/o left ear pain. She says she has difficulty hearing from left ear, she wanted her left ear to be checked.   She also has diabetes mellitus and she brought home reading that shows her sugar been between 98 to 147 mg/dl. She also has CKD 3a and is here for repeat labs.    She also has hypertension and brought home readings, her BP is between 109/60 to 150/78 but only once it was high.    She also has dyslipidemia and LDL is target control.    She also has PAF and is on eliquis. She will follow with cardiologist.   Past Medical History Past Medical History:  Diagnosis Date   Abnormal EKG    suggestive of previous anterior wall myocardial infarction   Atrial fibrillation (HCC)    On Eliquis   Diabetes mellitus (HCC)    Hypertension    Hypothyroidism      Allergies Allergies  Allergen Reactions   Amoxicillin-Pot Clavulanate Diarrhea and Other (See Comments)    Drowsy     Brimonidine Other (See Comments)    Conj follicles and injection    Lisinopril Swelling and Other (See Comments)    Angioedema welling lips, throat closing     Brimonidine Tartrate-Timolol Other (See Comments)    Redness in eye   Zolpidem Swelling    Lips swell     Review of Systems Review of Systems  Constitutional: Negative.   Respiratory: Negative.    Cardiovascular: Negative.   Gastrointestinal: Negative.   Neurological:  Positive for headaches.       Objective:    Vitals BP (!) 148/76 (BP Location: Left Arm, Patient Position: Sitting, Cuff Size: Normal)   Pulse 65   Temp 97.7 F (36.5 C)   Resp 18   Ht 5\' 2"  (1.575 m)   Wt 134 lb 4 oz (60.9 kg)   SpO2 97%   BMI 24.55  kg/m    Physical Examination Physical Exam Constitutional:      Appearance: Normal appearance.  HENT:     Head: Normocephalic and atraumatic.  Cardiovascular:     Rate and Rhythm: Normal rate and regular rhythm.     Heart sounds: Normal heart sounds.  Pulmonary:     Effort: Pulmonary effort is normal.     Breath sounds: Normal breath sounds.  Abdominal:     General: Bowel sounds are normal.     Palpations: Abdomen is soft.  Neurological:     General: No focal deficit present.     Mental Status: She is alert and oriented to person, place, and time.        Assessment & Plan:   Benign essential HTN Slightly high today but she is in distress as well  PAF (paroxysmal atrial fibrillation) (HCC) Her rate is controlled and she is on eliquis  Diabetes mellitus due to underlying condition with stage 3a chronic kidney disease (HCC) She is monitoring her sugar at it has been controlled, will continue to monitor renal function  Neck pain on left side She has neck and left sided pain and I  have reviewed ED notes. I could not see ear drum, she need to see ENT and I may need neck imaging.  Anxiety Will control her pain then re-evaluate.    No follow-ups on file.   Eloisa Northern, MD

## 2022-11-19 ENCOUNTER — Ambulatory Visit: Payer: Medicare HMO | Admitting: Internal Medicine

## 2022-12-25 ENCOUNTER — Other Ambulatory Visit: Payer: Self-pay | Admitting: Cardiology

## 2022-12-25 DIAGNOSIS — I4891 Unspecified atrial fibrillation: Secondary | ICD-10-CM

## 2022-12-25 NOTE — Telephone Encounter (Signed)
Prescription refill request for Eliquis received. Indication: Afib  Last office visit: 05/21/22 Cristal Deer)  Scr: 1.45(11/06/22)  Age: 89 Weight: 60.9kg  Appropriate dose. Refill sent.

## 2023-02-11 ENCOUNTER — Ambulatory Visit: Payer: Medicare HMO | Admitting: Internal Medicine

## 2023-02-11 ENCOUNTER — Other Ambulatory Visit: Payer: Self-pay | Admitting: Internal Medicine

## 2023-02-11 ENCOUNTER — Encounter: Payer: Self-pay | Admitting: Internal Medicine

## 2023-02-11 VITALS — BP 122/80 | HR 62 | Temp 98.0°F | Resp 18 | Ht 62.0 in | Wt 131.0 lb

## 2023-02-11 DIAGNOSIS — I480 Paroxysmal atrial fibrillation: Secondary | ICD-10-CM

## 2023-02-11 DIAGNOSIS — E785 Hyperlipidemia, unspecified: Secondary | ICD-10-CM | POA: Diagnosis not present

## 2023-02-11 DIAGNOSIS — E039 Hypothyroidism, unspecified: Secondary | ICD-10-CM | POA: Diagnosis not present

## 2023-02-11 DIAGNOSIS — N1831 Chronic kidney disease, stage 3a: Secondary | ICD-10-CM | POA: Diagnosis not present

## 2023-02-11 DIAGNOSIS — I10 Essential (primary) hypertension: Secondary | ICD-10-CM

## 2023-02-11 DIAGNOSIS — E0822 Diabetes mellitus due to underlying condition with diabetic chronic kidney disease: Secondary | ICD-10-CM

## 2023-02-11 NOTE — Assessment & Plan Note (Signed)
controlled 

## 2023-02-11 NOTE — Progress Notes (Signed)
Office Visit  Subjective   Patient ID: Cheryl Mora   DOB: [birth year]   Age: 89 y.o.   MRN: [id number]   Chief Complaint Chief Complaint  Patient presents with   Follow-up    3 month follow up     History of Present Illness 89 years old female is for follow up. She has left sided headache and someone here gave her message and her pain is gone. She could not see ENT as they have cancelled appointment last movement.   She also has diabetes mellitus and she brought home reading that shows her sugar been between 120 to 130 mg/dl. She has seen eye doctor thi year for diabetic eye examination.   She also has CKD 3a and is here for repeat labs.    She also has hypertension and her blood pressure is well controlled.    She also has dyslipidemia and LDL is target control.    She also has PAF and is on eliquis. She will follow with cardiologist.   Past Medical History Past Medical History:  Diagnosis Date   Abnormal EKG    suggestive of previous anterior wall myocardial infarction   Atrial fibrillation (HCC)    On Eliquis   Diabetes mellitus (HCC)    Hypertension    Hypothyroidism      Allergies Allergies  Allergen Reactions   Amoxicillin-Pot Clavulanate Diarrhea and Other (See Comments)    Drowsy     Brimonidine Other (See Comments)    Conj follicles and injection    Lisinopril Swelling and Other (See Comments)    Angioedema welling lips, throat closing     Brimonidine Tartrate-Timolol Other (See Comments)    Redness in eye   Zolpidem Swelling    Lips swell     Review of Systems Review of Systems  Constitutional: Negative.   HENT: Negative.    Respiratory: Negative.    Cardiovascular: Negative.   Gastrointestinal: Negative.   Neurological: Negative.        Objective:    Vitals BP 122/80 (BP Location: Left Arm, Patient Position: Sitting, Cuff Size: Normal)   Pulse 62   Temp 98 F (36.7 C)   Resp 18   Ht 5\' 2"  (1.575 m)   Wt 131 lb (59.4 kg)   SpO2  97%   BMI 23.96 kg/m    Physical Examination Physical Exam Constitutional:      Appearance: Normal appearance.  HENT:     Head: Normocephalic and atraumatic.  Cardiovascular:     Rate and Rhythm: Normal rate and regular rhythm.     Heart sounds: Normal heart sounds.  Pulmonary:     Effort: Pulmonary effort is normal.     Breath sounds: Normal breath sounds.  Abdominal:     General: Bowel sounds are normal.     Palpations: Abdomen is soft.  Neurological:     General: No focal deficit present.     Mental Status: She is alert and oriented to person, place, and time.        Assessment & Plan:   Dyslipidemia She is on rosuvastatin 20 mg daily and will do lipid panel today  Benign essential HTN controlled  Diabetes mellitus due to underlying condition with stage 3a chronic kidney disease (HCC) I will do HbA1c and kidney function repeat today  PAF (paroxysmal atrial fibrillation) (HCC) She is on amiodarone and eliquis, she will see cardiologist as follow up.     Return in about 3 months (around  05/13/2023).   Eloisa Northern, MD

## 2023-02-11 NOTE — Assessment & Plan Note (Signed)
She is on amiodarone and eliquis, she will see cardiologist as follow up.

## 2023-02-11 NOTE — Assessment & Plan Note (Signed)
Stable

## 2023-02-11 NOTE — Assessment & Plan Note (Addendum)
She is on rosuvastatin 20 mg daily and will do lipid panel today

## 2023-02-11 NOTE — Assessment & Plan Note (Signed)
I will do HbA1c and kidney function repeat today

## 2023-02-12 ENCOUNTER — Telehealth: Payer: Self-pay | Admitting: Internal Medicine

## 2023-02-12 ENCOUNTER — Other Ambulatory Visit: Payer: Self-pay | Admitting: Internal Medicine

## 2023-02-12 LAB — URINALYSIS, ROUTINE W REFLEX MICROSCOPIC
Bilirubin, UA: NEGATIVE
Glucose, UA: NEGATIVE
Ketones, UA: NEGATIVE
Nitrite, UA: NEGATIVE
Specific Gravity, UA: 1.008 (ref 1.005–1.030)
Urobilinogen, Ur: 0.2 mg/dL (ref 0.2–1.0)
pH, UA: 6 (ref 5.0–7.5)

## 2023-02-12 LAB — COMPREHENSIVE METABOLIC PANEL
ALT: 96 IU/L — ABNORMAL HIGH (ref 0–32)
AST: 80 IU/L — ABNORMAL HIGH (ref 0–40)
Albumin: 4.1 g/dL (ref 3.7–4.7)
Alkaline Phosphatase: 87 IU/L (ref 44–121)
BUN/Creatinine Ratio: 15 (ref 12–28)
BUN: 30 mg/dL — ABNORMAL HIGH (ref 8–27)
Bilirubin Total: 0.5 mg/dL (ref 0.0–1.2)
CO2: 18 mmol/L — ABNORMAL LOW (ref 20–29)
Calcium: 8.4 mg/dL — ABNORMAL LOW (ref 8.7–10.3)
Chloride: 105 mmol/L (ref 96–106)
Creatinine, Ser: 2.05 mg/dL — ABNORMAL HIGH (ref 0.57–1.00)
Globulin, Total: 2.5 g/dL (ref 1.5–4.5)
Glucose: 122 mg/dL — ABNORMAL HIGH (ref 70–99)
Potassium: 4.2 mmol/L (ref 3.5–5.2)
Sodium: 139 mmol/L (ref 134–144)
Total Protein: 6.6 g/dL (ref 6.0–8.5)
eGFR: 23 mL/min/{1.73_m2} — ABNORMAL LOW (ref >59)

## 2023-02-12 LAB — LIPID PANEL W/O CHOL/HDL RATIO
Cholesterol, Total: 127 mg/dL (ref 100–199)
HDL: 62 mg/dL (ref >39)
LDL Chol Calc (NIH): 49 mg/dL (ref 0–99)
Triglycerides: 83 mg/dL (ref 0–149)
VLDL Cholesterol Cal: 16 mg/dL (ref 5–40)

## 2023-02-12 LAB — MICROSCOPIC EXAMINATION: Casts: NONE SEEN /LPF

## 2023-02-12 LAB — TSH: TSH: 0.957 u[IU]/mL (ref 0.450–4.500)

## 2023-02-12 LAB — HGB A1C W/O EAG: Hgb A1c MFr Bld: 6.6 % — ABNORMAL HIGH (ref 4.8–5.6)

## 2023-02-12 MED ORDER — DAPAGLIFLOZIN PROPANEDIOL 5 MG PO TABS
5.0000 mg | ORAL_TABLET | Freq: Every day | ORAL | 4 refills | Status: DC
Start: 2023-02-12 — End: 2023-03-18

## 2023-02-12 NOTE — Assessment & Plan Note (Signed)
She is monitoring her sugar at it has been controlled, will continue to monitor renal function

## 2023-02-12 NOTE — Assessment & Plan Note (Signed)
She has neck and left sided pain and I have reviewed ED notes. I could not see ear drum, she need to see ENT and I may need neck imaging.

## 2023-02-12 NOTE — Assessment & Plan Note (Signed)
Will control her pain then re-evaluate.

## 2023-02-12 NOTE — Assessment & Plan Note (Signed)
Slightly high today but she is in distress as well

## 2023-02-12 NOTE — Assessment & Plan Note (Signed)
Her rate is controlled and she is on eliquis.

## 2023-02-12 NOTE — Telephone Encounter (Signed)
I have spoken with er about her labs and worsening renal function. She also has proteinuria and I will start her farxiga 5 mg daily and see her in 1 month.

## 2023-02-27 ENCOUNTER — Ambulatory Visit: Payer: Medicare HMO | Admitting: Internal Medicine

## 2023-02-27 ENCOUNTER — Encounter: Payer: Self-pay | Admitting: Internal Medicine

## 2023-02-27 VITALS — BP 118/70 | HR 71 | Temp 97.9°F | Resp 18 | Ht 62.0 in | Wt 123.5 lb

## 2023-02-27 DIAGNOSIS — R197 Diarrhea, unspecified: Secondary | ICD-10-CM | POA: Diagnosis not present

## 2023-02-27 LAB — POC COVID19 BINAXNOW: SARS Coronavirus 2 Ag: NEGATIVE

## 2023-02-27 MED ORDER — DIPHENOXYLATE-ATROPINE 2.5-0.025 MG PO TABS: 1.0000 | ORAL_TABLET | Freq: Four times a day (QID) | ORAL | 0 refills | Status: AC | PRN

## 2023-02-27 MED ORDER — ONDANSETRON HCL 4 MG PO TABS: 4.0000 mg | ORAL_TABLET | Freq: Three times a day (TID) | ORAL | 0 refills | Status: DC | PRN

## 2023-02-27 NOTE — Assessment & Plan Note (Signed)
She is not dehydrated on my exam today.  We will give her zofran as needed for n/v and she can continue the peptol bismol as needed and we will give her some lomotil.  If she worsens, she can RTC or go to urgent care.

## 2023-02-27 NOTE — Progress Notes (Signed)
Office Visit  Subjective   Patient ID: Zyiah Gelpi   DOB: 1932/11/06   Age: 89 y.o.   MRN: [id number]   Chief Complaint Chief Complaint  Patient presents with   Abdominal Pain    Office visit Loose stools for 3 days.     History of Present Illness Mrs. Bussa is a 89 yo female who comes in today for an acute visit for complaints of nausea, vomiting and diarrhea.  She states this started acutely about 2 days ago where it started with nausea and vomiting.  She then developed diarrhea where she is having 5-10 liquid bowel movements per day.  There is no blood or mucus and she states she has some minimal abdominal soreness.  There is no fevers, chills, headaches, SOB, myalgias, rash or other problems.  The patient began immodium scheduled about 2 days ago but this did not help.  She then tried pepto bismol pills which she states helped overnight and this morning but then her diarrhea came back today.  The patient denies any recent antibiotics.     Past Medical History Past Medical History:  Diagnosis Date   Abnormal EKG    suggestive of previous anterior wall myocardial infarction   Atrial fibrillation (HCC)    On Eliquis   Diabetes mellitus (HCC)    Hypertension    Hypothyroidism      Allergies Allergies  Allergen Reactions   Amoxicillin-Pot Clavulanate Diarrhea and Other (See Comments)    Drowsy     Brimonidine Other (See Comments)    Conj follicles and injection    Lisinopril Swelling and Other (See Comments)    Angioedema welling lips, throat closing     Brimonidine Tartrate-Timolol Other (See Comments)    Redness in eye   Zolpidem Swelling    Lips swell     Medications  Current Outpatient Medications:    amiodarone (PACERONE) 200 MG tablet, TAKE 1 TABLET EVERY DAY, Disp: 90 tablet, Rfl: 3   apixaban (ELIQUIS) 5 MG TABS tablet, TAKE 1 TABLET TWICE DAILY, Disp: 180 tablet, Rfl: 1   ascorbic acid (VITAMIN C) 250 MG CHEW, Chew 250 mg by mouth 2 (two) times  daily., Disp: , Rfl:    calcium carbonate (TUMS - DOSED IN MG ELEMENTAL CALCIUM) 500 MG chewable tablet, Chew 1-2 tablets by mouth daily as needed for indigestion or heartburn., Disp: , Rfl:    Calcium-Magnesium-Zinc 333-133-5 MG TABS, Take 1 tablet by mouth daily., Disp: , Rfl:    cetirizine (ZYRTEC) 10 MG tablet, Take 10 mg by mouth daily as needed for allergies., Disp: , Rfl:    dapagliflozin propanediol (FARXIGA) 5 MG TABS tablet, Take 1 tablet (5 mg total) by mouth daily before breakfast., Disp: 30 tablet, Rfl: 4   diclofenac Sodium (VOLTAREN) 1 % GEL, Apply 2 g topically 4 (four) times daily., Disp: 60 g, Rfl: 1   diphenhydramine-acetaminophen (TYLENOL PM) 25-500 MG TABS tablet, Take 1 tablet by mouth at bedtime as needed (sleep)., Disp: , Rfl:    dorzolamide-timolol (COSOPT) 2-0.5 % ophthalmic solution, Place 1 drop into the left eye 2 (two) times daily., Disp: 10 mL, Rfl: 2   fluticasone (FLONASE) 50 MCG/ACT nasal spray, Place 1 spray into both nostrils 2 (two) times daily as needed for allergies., Disp: 60 mL, Rfl: 6   levothyroxine (SYNTHROID) 75 MCG tablet, Take 75 mcg by mouth daily before breakfast., Disp: , Rfl:    melatonin 5 MG TABS, Take 5 mg by mouth at  bedtime as needed (sleep)., Disp: , Rfl:    Menthol-Methyl Salicylate (SALONPAS PAIN RELIEF PATCH EX), Apply 1 application topically daily as needed (pain)., Disp: , Rfl:    metFORMIN (GLUCOPHAGE-XR) 500 MG 24 hr tablet, Take 500 mg by mouth 2 (two) times daily., Disp: , Rfl:    nitroGLYCERIN (NITROSTAT) 0.4 MG SL tablet, Place 1 tablet (0.4 mg total) under the tongue every 5 (five) minutes as needed for chest pain., Disp: 90 tablet, Rfl: 3   Polyethyl Glycol-Propyl Glycol (SYSTANE OP), Place 1 drop into both eyes daily as needed (dryness)., Disp: , Rfl:    rosuvastatin (CRESTOR) 20 MG tablet, Take 1 tablet (20 mg total) by mouth daily., Disp: 90 tablet, Rfl: 1   vitamin B-12 (CYANOCOBALAMIN) 100 MCG tablet, Take 100 mcg by mouth  daily., Disp: , Rfl:    Review of Systems Review of Systems  Constitutional:  Negative for chills and fever.  Respiratory:  Negative for shortness of breath.   Cardiovascular:  Negative for chest pain and palpitations.  Gastrointestinal:  Positive for diarrhea, nausea and vomiting. Negative for abdominal pain, blood in stool, constipation and melena.  Genitourinary:  Negative for frequency.  Musculoskeletal:  Negative for myalgias.  Neurological:  Positive for weakness. Negative for dizziness and headaches.       Objective:    Vitals BP 118/70 (BP Location: Left Arm, Patient Position: Sitting, Cuff Size: Normal)   Pulse 71   Temp 97.9 F (36.6 C)   Resp 18   Ht 5\' 2"  (1.575 m)   Wt 123 lb 8 oz (56 kg)   SpO2 95%   BMI 22.59 kg/m    Physical Examination Physical Exam Constitutional:      Appearance: Normal appearance. She is not ill-appearing.  Cardiovascular:     Rate and Rhythm: Normal rate and regular rhythm.     Pulses: Normal pulses.     Heart sounds: No murmur heard.    No friction rub. No gallop.  Pulmonary:     Effort: Pulmonary effort is normal. No respiratory distress.     Breath sounds: No wheezing, rhonchi or rales.  Abdominal:     General: Bowel sounds are normal. There is no distension.     Palpations: Abdomen is soft.     Tenderness: There is no abdominal tenderness.  Musculoskeletal:     Right lower leg: No edema.     Left lower leg: No edema.  Skin:    General: Skin is warm and dry.     Findings: No rash.  Neurological:     Mental Status: She is alert.        Assessment & Plan:   Diarrhea She is not dehydrated on my exam today.  We will give her zofran as needed for n/v and she can continue the peptol bismol as needed and we will give her some lomotil.  If she worsens, she can RTC or go to urgent care.    No follow-ups on file.   Crist Fat, MD

## 2023-03-18 ENCOUNTER — Other Ambulatory Visit: Payer: Self-pay | Admitting: Internal Medicine

## 2023-03-18 ENCOUNTER — Ambulatory Visit: Payer: Medicare HMO | Admitting: Internal Medicine

## 2023-03-18 ENCOUNTER — Encounter: Payer: Self-pay | Admitting: Internal Medicine

## 2023-03-18 VITALS — BP 126/78 | HR 86 | Temp 97.2°F | Resp 18 | Ht 62.0 in | Wt 127.5 lb

## 2023-03-18 DIAGNOSIS — R197 Diarrhea, unspecified: Secondary | ICD-10-CM

## 2023-03-18 DIAGNOSIS — N1831 Chronic kidney disease, stage 3a: Secondary | ICD-10-CM

## 2023-03-18 DIAGNOSIS — E0822 Diabetes mellitus due to underlying condition with diabetic chronic kidney disease: Secondary | ICD-10-CM | POA: Diagnosis not present

## 2023-03-18 MED ORDER — LOPERAMIDE HCL 2 MG PO TABS: 4.0000 mg | ORAL_TABLET | Freq: Four times a day (QID) | ORAL | 0 refills | Status: AC | PRN

## 2023-03-18 NOTE — Progress Notes (Signed)
Office Visit  Subjective   Patient ID: Cheryl Mora   DOB: 25-May-1933   Age: 89 y.o.   MRN: [id number]   Chief Complaint Chief Complaint  Patient presents with   office visit    Patient here for medication change     History of Present Illness 89 years old female who is here c/ feeling weak, diarrhea and stuffy nose. I have suggested her to stop metformin on last visit because of GFR 23 but she still is taking 500 mg twice a day. Her HbA1c was 6.6% on 02/11/23.   She has diarrhea for 2 weeks. She has 1-2 loose stool per day.She says that when she has to go she go, she was seen Dr. Leonia Reader on 9/19 and she was given lomotil and she only have 4 tablets left. She denies any abdomen cramps.   She has diabetes mellitus and CKD but last month her GFR was decreased to 23. I have suggested to stop metformin and she also admit that she has been eating a lot of cheese lately.    Past Medical History Past Medical History:  Diagnosis Date   Abnormal EKG    suggestive of previous anterior wall myocardial infarction   Atrial fibrillation (HCC)    On Eliquis   Diabetes mellitus (HCC)    Hypertension    Hypothyroidism      Allergies Allergies  Allergen Reactions   Amoxicillin-Pot Clavulanate Diarrhea and Other (See Comments)    Drowsy     Brimonidine Other (See Comments)    Conj follicles and injection    Lisinopril Swelling and Other (See Comments)    Angioedema welling lips, throat closing     Brimonidine Tartrate-Timolol Other (See Comments)    Redness in eye   Zolpidem Swelling    Lips swell     Review of Systems Review of Systems  Constitutional: Negative.   Respiratory: Negative.    Cardiovascular: Negative.   Gastrointestinal:  Positive for diarrhea.       Objective:    Vitals BP 126/78 (BP Location: Left Arm, Patient Position: Sitting, Cuff Size: Normal)   Pulse 86   Temp (!) 97.2 F (36.2 C)   Resp 18   Ht 5\' 2"  (1.575 m)   Wt 127 lb 8 oz (57.8 kg)    SpO2 99%   BMI 23.32 kg/m    Physical Examination Physical Exam Constitutional:      Appearance: Normal appearance.  Cardiovascular:     Rate and Rhythm: Normal rate and regular rhythm.     Heart sounds: Normal heart sounds.  Pulmonary:     Effort: Pulmonary effort is normal.     Breath sounds: Normal breath sounds.  Abdominal:     General: Bowel sounds are normal.     Palpations: Abdomen is soft.  Neurological:     General: No focal deficit present.     Mental Status: She is alert and oriented to person, place, and time.        Assessment & Plan:   Diabetes mellitus due to underlying condition with stage 3a chronic kidney disease (HCC) Her GFR was 23 last month, I have stopped her metformin and farxiga. Her sugar is well controlled. She check her sugar at home. So will monitor.  Diarrhea She will take immodium 4 mg every 8 hours as needed basis. She will stop farxiga and metformin and she will cut down cheese as well. If diarrhea is not better then will re-evaluate.  No follow-ups on file.   Eloisa Northern, MD

## 2023-03-18 NOTE — Assessment & Plan Note (Signed)
She will take immodium 4 mg every 8 hours as needed basis. She will stop farxiga and metformin and she will cut down cheese as well. If diarrhea is not better then will re-evaluate.

## 2023-03-18 NOTE — Assessment & Plan Note (Signed)
Her GFR was 23 last month, I have stopped her metformin and farxiga. Her sugar is well controlled. She check her sugar at home. So will monitor.

## 2023-03-24 ENCOUNTER — Encounter (HOSPITAL_BASED_OUTPATIENT_CLINIC_OR_DEPARTMENT_OTHER): Payer: Self-pay | Admitting: Family

## 2023-03-24 ENCOUNTER — Ambulatory Visit (HOSPITAL_BASED_OUTPATIENT_CLINIC_OR_DEPARTMENT_OTHER): Payer: Medicare HMO | Admitting: Family

## 2023-03-24 ENCOUNTER — Other Ambulatory Visit (HOSPITAL_BASED_OUTPATIENT_CLINIC_OR_DEPARTMENT_OTHER): Payer: Self-pay

## 2023-03-24 VITALS — BP 134/76 | HR 63 | Ht 62.0 in | Wt 125.8 lb

## 2023-03-24 DIAGNOSIS — R0989 Other specified symptoms and signs involving the circulatory and respiratory systems: Secondary | ICD-10-CM | POA: Diagnosis not present

## 2023-03-24 DIAGNOSIS — I480 Paroxysmal atrial fibrillation: Secondary | ICD-10-CM

## 2023-03-24 DIAGNOSIS — I25118 Atherosclerotic heart disease of native coronary artery with other forms of angina pectoris: Secondary | ICD-10-CM | POA: Diagnosis not present

## 2023-03-24 DIAGNOSIS — N1832 Chronic kidney disease, stage 3b: Secondary | ICD-10-CM

## 2023-03-24 DIAGNOSIS — D6859 Other primary thrombophilia: Secondary | ICD-10-CM | POA: Diagnosis not present

## 2023-03-24 DIAGNOSIS — E785 Hyperlipidemia, unspecified: Secondary | ICD-10-CM

## 2023-03-24 LAB — COMPREHENSIVE METABOLIC PANEL
ALT: 68 [IU]/L — ABNORMAL HIGH (ref 0–32)
AST: 59 [IU]/L — ABNORMAL HIGH (ref 0–40)
Albumin: 3.7 g/dL (ref 3.7–4.7)
Alkaline Phosphatase: 96 [IU]/L (ref 44–121)
BUN/Creatinine Ratio: 12 (ref 12–28)
BUN: 28 mg/dL — ABNORMAL HIGH (ref 8–27)
Bilirubin Total: 0.3 mg/dL (ref 0.0–1.2)
CO2: 17 mmol/L — ABNORMAL LOW (ref 20–29)
Calcium: 8.1 mg/dL — ABNORMAL LOW (ref 8.7–10.3)
Chloride: 113 mmol/L — ABNORMAL HIGH (ref 96–106)
Creatinine, Ser: 2.39 mg/dL — ABNORMAL HIGH (ref 0.57–1.00)
Globulin, Total: 2.5 g/dL (ref 1.5–4.5)
Glucose: 160 mg/dL — ABNORMAL HIGH (ref 70–99)
Potassium: 4.9 mmol/L (ref 3.5–5.2)
Sodium: 146 mmol/L — ABNORMAL HIGH (ref 134–144)
Total Protein: 6.2 g/dL (ref 6.0–8.5)
eGFR: 19 mL/min/{1.73_m2} — ABNORMAL LOW (ref >59)

## 2023-03-24 LAB — SPECIMEN STATUS REPORT

## 2023-03-24 MED ORDER — APIXABAN 2.5 MG PO TABS: 2.5000 mg | ORAL_TABLET | Freq: Two times a day (BID) | ORAL | Status: DC

## 2023-03-24 MED ORDER — APIXABAN 2.5 MG PO TABS: 2.5000 mg | ORAL_TABLET | Freq: Two times a day (BID) | ORAL | 3 refills | Status: DC

## 2023-03-24 NOTE — Progress Notes (Unsigned)
Cardiology Office Note:  .   Date:  03/26/2023  ID:  Cheryl Mora, DOB July 16, 1932, MRN [id number] PCP: Eloisa Northern, MD  [facility] Providers Cardiologist:  Jodelle Red, MD    History of Present Illness: Cheryl Mora is a 89 y.o. female DM2, HLD, HTN, hypothyroidism, PAF.   Seen 03/02/20 as new consult for DOE. LHC 03/07/23 with NICM, reduced EF, new afib RVR, nonobstructive coronary disease. Admitted 02/2022 with PNA requiring BIPAP.   Presents today for follow up with her daughter. Doing well from cardiac perspective. Having difficulties with diarrhea, saw PCP 03/18/23 and recommended to use Immodium but not yet picked up from pharmacy. Started probiotic yesterday. Since last seen has stopped drinking 3-4 martinis every night with improvement in liver enzymes. Her kidney function has shown sharp decline recently 02/2022 creatinine 0.95 ? 11/06/22 creatinine 1.45 ? 02/11/23 creatinine 2.05 ? 03/18/23 creatinine 2.39.  ROS: Please see the history of present illness.    All other systems reviewed and are negative.   Studies Reviewed: .        Cardiac Studies & Procedures   CARDIAC CATHETERIZATION  CARDIAC CATHETERIZATION 03/06/2020  Narrative  Prox RCA lesion is 30% stenosed.  There is severe left ventricular systolic dysfunction.  LV end diastolic pressure is normal.  The left ventricular ejection fraction is 25-35% by visual estimate.  1. Mild nonobstructive CAD 2. Severe LV dysfunction with pattern consistent with Takotsubo's cardiomyopathy. EF 30% 3. Normal LVEDP 4. New onset Atrial fibrillation compared to yesterday.  Plan: admit to telemetry. Will initiate IV heparin. May consider transition to NOAC tomorrow. Hold amlodipine due to hypotension. Switch atenolol to metoprolol. Check Echo.  Findings Coronary Findings Diagnostic  Dominance: Right  Left Main Vessel was injected. Vessel is normal in caliber. Vessel is angiographically normal.  Left  Anterior Descending Vessel was injected. Vessel is large. The vessel exhibits minimal luminal irregularities.  Ramus Intermedius Vessel was injected. Vessel is large.  Left Circumflex Vessel was injected. Vessel is small. The vessel exhibits minimal luminal irregularities.  Right Coronary Artery Prox RCA lesion is 30% stenosed.  Intervention  No interventions have been documented.     ECHOCARDIOGRAM  ECHOCARDIOGRAM COMPLETE 09/20/2020  Narrative ECHOCARDIOGRAM REPORT    Patient Name:   Cheryl Mora [facility] Date of Exam: 09/20/2020 Medical Rec #:  [id number]        Height:       63.0 in Accession #:    [id number]       Weight:       141.8 lb Date of Birth:  Nov 02, 1932         BSA:          1.671 m Patient Age:    87 years         BP:           158/78 mmHg Patient Gender: F                HR:           82 bpm. Exam Location:  [address]  Procedure: 2D Echo, Cardiac Doppler and Color Doppler  Indications:    I48.2 Non ischemic cardiomyopathy  History:        Patient has prior history of Echocardiogram examinations, most recent 03/06/2020. Non-ischemic cadiomyopathy, 9/21 LHC mild non obstruction CAD. Severe LV dysfunction with pattern consisting with Tako-tsubo cardiomyopathy with EF 30%, Arrythmias:Atrial Fibrillation, Signs/Symptoms:Dyspnea on exertion; Risk Factors:Former Smoker, Diabetes, Hypertension and Dyslipidemia.  Previous echo revealed LVEF 30% with moderate MAC, mild MR and PAP 29.8 mmHg.  Sonographer:    Chan[facility], RDCS Referring Phys: [phone] BRIDGETTE CHRISTOPHER  IMPRESSIONS   1. Left ventricular ejection fraction, by estimation, is 55 to 60%. The left ventricle has normal function. The left ventricle has no regional wall motion abnormalities. There is mild left ventricular hypertrophy. Left ventricular diastolic parameters are indeterminate. 2. Right ventricular systolic function is normal. The right ventricular size is normal. There is normal  pulmonary artery systolic pressure. The estimated right ventricular systolic pressure is 35.5 mmHg. 3. Left atrial size was moderately dilated. 4. Right atrial size was mildly dilated. 5. The mitral valve is normal in structure. Moderate mitral valve regurgitation. No evidence of mitral stenosis. 6. The aortic valve is tricuspid. Aortic valve regurgitation is not visualized. Mild aortic valve sclerosis is present, with no evidence of aortic valve stenosis. 7. The inferior vena cava is normal in size with greater than 50% respiratory variability, suggesting right atrial pressure of 3 mmHg. 8. The patient was in atrial fibrillation.  FINDINGS Left Ventricle: Left ventricular ejection fraction, by estimation, is 55 to 60%. The left ventricle has normal function. The left ventricle has no regional wall motion abnormalities. The left ventricular internal cavity size was normal in size. There is mild left ventricular hypertrophy. Left ventricular diastolic parameters are indeterminate.  Right Ventricle: The right ventricular size is normal. No increase in right ventricular wall thickness. Right ventricular systolic function is normal. There is normal pulmonary artery systolic pressure. The tricuspid regurgitant velocity is 2.85 m/s, and with an assumed right atrial pressure of 3 mmHg, the estimated right ventricular systolic pressure is 35.5 mmHg.  Left Atrium: Left atrial size was moderately dilated.  Right Atrium: Right atrial size was mildly dilated.  Pericardium: There is no evidence of pericardial effusion.  Mitral Valve: The mitral valve is normal in structure. There is moderate calcification of the mitral valve leaflet(s). Moderate mitral valve regurgitation. No evidence of mitral valve stenosis.  Tricuspid Valve: The tricuspid valve is normal in structure. Tricuspid valve regurgitation is trivial.  Aortic Valve: The aortic valve is tricuspid. Aortic valve regurgitation is not visualized.  Mild aortic valve sclerosis is present, with no evidence of aortic valve stenosis.  Pulmonic Valve: The pulmonic valve was normal in structure. Pulmonic valve regurgitation is not visualized.  Aorta: The aortic root is normal in size and structure.  Venous: The inferior vena cava is normal in size with greater than 50% respiratory variability, suggesting right atrial pressure of 3 mmHg.  IAS/Shunts: No atrial level shunt detected by color flow Doppler.   LEFT VENTRICLE PLAX 2D LVIDd:         4.00 cm  Diastology LVIDs:         2.10 cm  LV e' lateral:   8.09 cm/s LV PW:         1.00 cm  LV E/e' lateral: 15.6 LV IVS:        1.10 cm LVOT diam:     2.20 cm LV SV:         61 LV SV Index:   36 LVOT Area:     3.80 cm   RIGHT VENTRICLE            IVC RV Basal diam:  2.90 cm    IVC diam: 1.00 cm RV Mid diam:    2.20 cm RV S prime:     9.87 cm/s TAPSE (M-mode): 1.8 cm  LEFT ATRIUM             Index       RIGHT ATRIUM           Index LA diam:        4.10 cm 2.45 cm/m  RA Area:     16.30 cm LA Vol (A2C):   52.9 ml 31.66 ml/m RA Volume:   40.30 ml  24.12 ml/m LA Vol (A4C):   56.3 ml 33.70 ml/m LA Biplane Vol: 63.1 ml 37.77 ml/m AORTIC VALVE LVOT Vmax:   77.47 cm/s LVOT Vmean:  54.567 cm/s LVOT VTI:    0.159 m  AORTA Ao Root diam: 2.90 cm Ao Asc diam:  3.30 cm  MITRAL VALVE                TRICUSPID VALVE MV Area (PHT): 6.37 cm     TR Peak grad:   32.5 mmHg MV Decel Time: 119 msec     TR Vmax:        285.00 cm/s MV E velocity: 126.50 cm/s MV A velocity: 41.00 cm/s   SHUNTS MV E/A ratio:  3.09         Systemic VTI:  0.16 m Systemic Diam: 2.20 cm  Marca Ancona MD Electronically signed by Marca Ancona MD Signature Date/Time: 09/20/2020/4:24:54 PM    Final             Risk Assessment/Calculations:    CHA2DS2-VASc Score = 6   This indicates a 9.7% annual risk of stroke. The patient's score is based upon: CHF History: 1 HTN History: 1 Diabetes History: 0 Stroke  History: 0 Vascular Disease History: 1 Age Score: 2 Gender Score: 1           Physical Exam:   VS:  BP 134/76 (BP Location: Left Arm, Patient Position: Sitting, Cuff Size: Normal)   Pulse 63   Ht 5\' 2"  (1.575 m)   Wt 125 lb 12.8 oz (57.1 kg)   SpO2 95%   BMI 23.01 kg/m    Wt Readings from Last 3 Encounters:  03/24/23 125 lb 12.8 oz (57.1 kg)  03/18/23 127 lb 8 oz (57.8 kg)  02/27/23 123 lb 8 oz (56 kg)    GEN: Well nourished, well developed in no acute distress NECK: No JVD; No carotid bruits CARDIAC: RRR, no murmurs, rubs, gallops RESPIRATORY:  Clear to auscultation without rales, wheezing or rhonchi  ABDOMEN: Soft, non-tender, non-distended EXTREMITIES:  No edema; No deformity   ASSESSMENT AND PLAN: .    Labile hypertension - BP relatively controlled today in clinic. Continue Amlodipine 10mg  daily.   CKD - Concern for progression of kidney disease. .02/2022 creatinine 0.95 ? 11/06/22 creatinine 1.45 ? 02/11/23 creatinine 2.05 ? 03/18/23 creatinine 2.39. Has recently been experiencing diarrhea and dehydration may contribute. PCP recently stopped Metformin. Encouraged to consider referral to nephrology with PCP. Will additionally send note to PCP.   PAF / Hypercoagulable state / On Amiodarone therapy - RRR on auscultation. No palpitations. Not requiring AV nodal blocking agent. Given age >39, weight, 60kg, and creatinine >1.5 reduce Eliquis to 2.5mg  BID. Samples provided. CHA2DS2-VASc Score = 6 [CHF History: 1, HTN History: 1, Diabetes History: 0, Stroke History: 0, Vascular Disease History: 1, Age Score: 2, Gender Score: 1].  Therefore, the patient's annual risk of stroke is 9.7 %.     Amiodarone monitoring: 03/18/23 AST 59, ALT 68 (this was improved since stopping evening martini). 02/2023 TSH 0.957  NICM with recovered  LVEF - Euvolemic and well compensated on exam. No indication for diuretic.   Nonobstructive CAD / HLD - Stable with no anginal symptoms. No indication for ischemic  evaluation.  GDMT Aspirin, Rosuvastatin. Recommend aiming for 150 minutes of moderate intensity activity per week and following a heart healthy diet.         Dispo: follow up in 4-6 mos  Signed, Alver Sorrow, NP

## 2023-03-24 NOTE — Patient Instructions (Addendum)
Medication Instructions:  REDUCE Eliquis 2.5mg  twice daily  Recommend using Immodium (loperamide) over the counter  Remain off Metformin and Farxiga   *If you need a refill on your cardiac medications before your next appointment, please call your pharmacy*  Follow-Up: At [facility], you and your health needs are our priority.  As part of our continuing mission to provide you with exceptional heart care, we have created designated Provider Care Teams.  These Care Teams include your primary Cardiologist (physician) and Advanced Practice Providers (APPs -  Physician Assistants and Nurse Practitioners) who all work together to provide you with the care you need, when you need it.  We recommend signing up for the patient portal called "MyChart".  Sign up information is provided on this After Visit Summary.  MyChart is used to connect with patients for Virtual Visits (Telemedicine).  Patients are able to view lab/test results, encounter notes, upcoming appointments, etc.  Non-urgent messages can be sent to your provider as well.   To learn more about what you can do with MyChart, go to [email or web address].    Your next appointment:   4-6 months with Dr. Cristal Deer

## 2023-03-26 ENCOUNTER — Encounter (HOSPITAL_BASED_OUTPATIENT_CLINIC_OR_DEPARTMENT_OTHER): Payer: Self-pay | Admitting: Family

## 2023-04-03 ENCOUNTER — Other Ambulatory Visit (HOSPITAL_BASED_OUTPATIENT_CLINIC_OR_DEPARTMENT_OTHER): Payer: Self-pay | Admitting: Cardiology

## 2023-04-15 ENCOUNTER — Other Ambulatory Visit: Payer: Self-pay

## 2023-04-15 MED ORDER — LEVOTHYROXINE SODIUM 75 MCG PO TABS: 75.0000 ug | ORAL_TABLET | Freq: Every day | ORAL | 1 refills | Status: DC

## 2023-04-17 ENCOUNTER — Other Ambulatory Visit: Payer: Self-pay | Admitting: Internal Medicine

## 2023-04-17 ENCOUNTER — Ambulatory Visit: Payer: Medicare HMO | Admitting: Internal Medicine

## 2023-04-17 DIAGNOSIS — R945 Abnormal results of liver function studies: Secondary | ICD-10-CM

## 2023-04-17 NOTE — Progress Notes (Signed)
Nurse visit

## 2023-04-18 LAB — COMPREHENSIVE METABOLIC PANEL
ALT: 84 [IU]/L — ABNORMAL HIGH (ref 0–32)
AST: 114 [IU]/L — ABNORMAL HIGH (ref 0–40)
Albumin: 3.9 g/dL (ref 3.7–4.7)
Alkaline Phosphatase: 100 [IU]/L (ref 44–121)
BUN/Creatinine Ratio: 13 (ref 12–28)
BUN: 23 mg/dL (ref 8–27)
Bilirubin Total: 0.5 mg/dL (ref 0.0–1.2)
CO2: 19 mmol/L — ABNORMAL LOW (ref 20–29)
Calcium: 8.4 mg/dL — ABNORMAL LOW (ref 8.7–10.3)
Chloride: 106 mmol/L (ref 96–106)
Creatinine, Ser: 1.77 mg/dL — ABNORMAL HIGH (ref 0.57–1.00)
Globulin, Total: 2.8 g/dL (ref 1.5–4.5)
Glucose: 141 mg/dL — ABNORMAL HIGH (ref 70–99)
Potassium: 4.4 mmol/L (ref 3.5–5.2)
Sodium: 143 mmol/L (ref 134–144)
Total Protein: 6.7 g/dL (ref 6.0–8.5)
eGFR: 27 mL/min/{1.73_m2} — ABNORMAL LOW (ref >59)

## 2023-04-29 DIAGNOSIS — Z1231 Encounter for screening mammogram for malignant neoplasm of breast: Secondary | ICD-10-CM | POA: Diagnosis not present

## 2023-05-05 ENCOUNTER — Encounter: Payer: Self-pay | Admitting: Internal Medicine

## 2023-05-13 ENCOUNTER — Encounter: Payer: Self-pay | Admitting: Internal Medicine

## 2023-05-13 ENCOUNTER — Ambulatory Visit: Payer: Medicare HMO | Admitting: Internal Medicine

## 2023-05-13 ENCOUNTER — Ambulatory Visit: Payer: Medicare HMO

## 2023-05-13 VITALS — BP 124/60 | HR 74 | Temp 97.8°F | Resp 18 | Ht 62.0 in | Wt 126.0 lb

## 2023-05-13 DIAGNOSIS — E785 Hyperlipidemia, unspecified: Secondary | ICD-10-CM | POA: Diagnosis not present

## 2023-05-13 DIAGNOSIS — R945 Abnormal results of liver function studies: Secondary | ICD-10-CM

## 2023-05-13 DIAGNOSIS — Z0000 Encounter for general adult medical examination without abnormal findings: Secondary | ICD-10-CM

## 2023-05-13 DIAGNOSIS — N1831 Diabetes mellitus due to underlying condition with diabetic chronic kidney disease: Secondary | ICD-10-CM

## 2023-05-13 DIAGNOSIS — E0822 Diabetes mellitus due to underlying condition with diabetic chronic kidney disease: Secondary | ICD-10-CM | POA: Diagnosis not present

## 2023-05-13 DIAGNOSIS — I10 Essential (primary) hypertension: Secondary | ICD-10-CM | POA: Diagnosis not present

## 2023-05-13 DIAGNOSIS — I480 Paroxysmal atrial fibrillation: Secondary | ICD-10-CM | POA: Diagnosis not present

## 2023-05-13 DIAGNOSIS — Z6823 Body mass index (BMI) 23.0-23.9, adult: Secondary | ICD-10-CM

## 2023-05-13 DIAGNOSIS — E039 Hypothyroidism, unspecified: Secondary | ICD-10-CM

## 2023-05-13 DIAGNOSIS — R5383 Other fatigue: Secondary | ICD-10-CM

## 2023-05-13 NOTE — Progress Notes (Signed)
Office Visit  Subjective   Patient ID: Arvie Chana   DOB: 1932/07/27   Age: 89 y.o.   MRN: [id number]   Chief Complaint Chief Complaint  Patient presents with   Annual Exam    Medicare annual exam     History of Present Illness 89 years old female who is here for annual wellness examination.   She feel tired all the time. She has abnormal liver function on last blood test. She also says she does not sleep good at night.   She live alone in her apartment, she is independent in all ADL. She drive and says that she has not accident.   She score 30/30 on MMSE today.  She has no fall.   She has flu shot this yea. She has COVID booster as well.    metformin on last visit because of GFR 23 but she still is taking 500 mg twice a day. Her HbA1c was 6.6% on 02/11/23.    She has diarrhea for 2 weeks. She has 1-2 loose stool per day.She says that when she has to go she go, she was seen Dr. Leonia Reader on 9/19 and she was given lomotil and she only have 4 tablets left. She denies any abdomen cramps.    She has diabetes mellitus and CKD but last month her GFR was decreased to 23. I have suggested to stop metformin and she also admit that she has been eating a lot of cheese lately.   Past Medical History Past Medical History:  Diagnosis Date   Abnormal EKG    suggestive of previous anterior wall myocardial infarction   Atrial fibrillation (HCC)    On Eliquis   Diabetes mellitus (HCC)    Hypertension    Hypothyroidism      Allergies Allergies  Allergen Reactions   Amoxicillin-Pot Clavulanate Diarrhea and Other (See Comments)    Drowsy     Brimonidine Other (See Comments)    Conj follicles and injection    Lisinopril Swelling and Other (See Comments)    Angioedema welling lips, throat closing     Brimonidine Tartrate-Timolol Other (See Comments)    Redness in eye   Zolpidem Swelling    Lips swell     Review of Systems Review of Systems  Constitutional:  Positive for  malaise/fatigue.  HENT: Negative.    Respiratory: Negative.    Cardiovascular: Negative.   Gastrointestinal: Negative.   Neurological: Negative.   Psychiatric/Behavioral:  The patient has insomnia.        Objective:    Vitals BP 124/60 (BP Location: Left Arm, Patient Position: Sitting, Cuff Size: Normal)   Pulse 74   Temp 97.8 F (36.6 C)   Resp 18   Ht 5\' 2"  (1.575 m)   Wt 126 lb (57.2 kg)   SpO2 98%   BMI 23.05 kg/m    Physical Examination Physical Exam Constitutional:      Appearance: Normal appearance.  HENT:     Head: Normocephalic and atraumatic.  Cardiovascular:     Rate and Rhythm: Normal rate and regular rhythm.     Heart sounds: Normal heart sounds.  Pulmonary:     Effort: Pulmonary effort is normal.     Breath sounds: Normal breath sounds.  Abdominal:     General: Bowel sounds are normal.     Palpations: Abdomen is soft.  Neurological:     General: No focal deficit present.     Mental Status: She is alert and  oriented to person, place, and time.        Assessment & Plan:   Fatigue I will do CBC for anemia evaluation as a cause for tiredness.  I will also treat her for insomnia if her tiredness persist then we will reevaluate.  Benign essential HTN Blood pressure is controlled.  PAF (paroxysmal atrial fibrillation) (HCC) She is in normal sinus rhythm.  She is on amiodarone and on Eliquis 2.5 mg for anticoagulation.  Her heart rate is controlled.  Diabetes mellitus due to underlying condition with stage 3a chronic kidney disease (HCC) Her blood sugar is controlled with hemoglobin A1c of 6.6% in September.  She has albuminuria and GFR was 23 that technically make it CKD 4.  I have stopped her metformin.  Will continue to monitor her blood sugar.  Hypothyroidism Her TSH was 0.9 and I will continue with current dose of levothyroxine 75 mcg daily.  Abnormal liver function I will liver function and then re-evaluate.  Dyslipidemia Her LDL was  target controlled on September 3 lipid panel.  She has rosuvastatin.  Will continue with current treatment.    Return in about 1 month (around 06/13/2023).   Eloisa Northern, MD

## 2023-05-15 ENCOUNTER — Ambulatory Visit: Payer: Medicare HMO

## 2023-05-19 ENCOUNTER — Other Ambulatory Visit (HOSPITAL_BASED_OUTPATIENT_CLINIC_OR_DEPARTMENT_OTHER): Payer: Self-pay | Admitting: Cardiology

## 2023-05-19 DIAGNOSIS — I4819 Other persistent atrial fibrillation: Secondary | ICD-10-CM

## 2023-05-26 MED ORDER — MIRTAZAPINE 7.5 MG PO TABS: 7.5000 mg | ORAL_TABLET | Freq: Every day | ORAL | 4 refills | Status: DC

## 2023-05-26 NOTE — Assessment & Plan Note (Signed)
Her TSH was 0.9 and I will continue with current dose of levothyroxine 75 mcg daily.

## 2023-05-26 NOTE — Assessment & Plan Note (Signed)
Her blood sugar is controlled with hemoglobin A1c of 6.6% in September.  She has albuminuria and GFR was 23 that technically make it CKD 4.  I have stopped her metformin.  Will continue to monitor her blood sugar.

## 2023-05-26 NOTE — Assessment & Plan Note (Signed)
Blood pressure is controlled

## 2023-05-26 NOTE — Assessment & Plan Note (Signed)
She is in normal sinus rhythm.  She is on amiodarone and on Eliquis 2.5 mg for anticoagulation.  Her heart rate is controlled.

## 2023-05-26 NOTE — Assessment & Plan Note (Signed)
Her LDL was target controlled on September 3 lipid panel.  She has rosuvastatin.  Will continue with current treatment.

## 2023-05-26 NOTE — Assessment & Plan Note (Signed)
I will do CBC for anemia evaluation as a cause for tiredness.  I will also treat her for insomnia if her tiredness persist then we will reevaluate.

## 2023-05-26 NOTE — Assessment & Plan Note (Signed)
I will liver function and then re-evaluate.

## 2023-05-27 ENCOUNTER — Other Ambulatory Visit: Payer: Self-pay | Admitting: Internal Medicine

## 2023-05-27 MED ORDER — PAXLOVID (300/100) 20 X 150 MG & 10 X 100MG PO TBPK: 3.0000 | ORAL_TABLET | Freq: Two times a day (BID) | ORAL | 0 refills | Status: AC

## 2023-06-16 ENCOUNTER — Other Ambulatory Visit: Payer: Self-pay | Admitting: Internal Medicine

## 2023-06-17 ENCOUNTER — Other Ambulatory Visit: Payer: Self-pay | Admitting: Internal Medicine

## 2023-06-17 ENCOUNTER — Ambulatory Visit: Payer: Medicare HMO | Admitting: Internal Medicine

## 2023-06-17 VITALS — BP 140/80 | HR 66 | Temp 97.9°F | Resp 18 | Wt 125.0 lb

## 2023-06-17 DIAGNOSIS — E0822 Diabetes mellitus due to underlying condition with diabetic chronic kidney disease: Secondary | ICD-10-CM | POA: Diagnosis not present

## 2023-06-17 DIAGNOSIS — E119 Type 2 diabetes mellitus without complications: Secondary | ICD-10-CM | POA: Diagnosis not present

## 2023-06-17 DIAGNOSIS — N1831 Chronic kidney disease, stage 3a: Secondary | ICD-10-CM | POA: Diagnosis not present

## 2023-06-17 DIAGNOSIS — E785 Hyperlipidemia, unspecified: Secondary | ICD-10-CM

## 2023-06-17 DIAGNOSIS — R945 Abnormal results of liver function studies: Secondary | ICD-10-CM

## 2023-06-17 DIAGNOSIS — I10 Essential (primary) hypertension: Secondary | ICD-10-CM | POA: Diagnosis not present

## 2023-06-17 DIAGNOSIS — E039 Hypothyroidism, unspecified: Secondary | ICD-10-CM

## 2023-06-17 NOTE — Assessment & Plan Note (Signed)
I will do TSH level today

## 2023-06-17 NOTE — Assessment & Plan Note (Signed)
Her LDL is target controlled 

## 2023-06-17 NOTE — Progress Notes (Addendum)
   Office Visit  Subjective   Patient ID: Cheryl Mora   DOB: 1933-03-20   Age: 89 y.o.   MRN: [id number]   Chief Complaint Chief Complaint  Patient presents with   Follow-up    1 month follow up     History of Present Illness 89 years old female who is here says her fatigue is little better. I gave her remeron  but that made her feel funny so she has stopped that.   She also has abnormal liver function test. She is here for repeat liver function today.   She has diabetes and her last HbA1c was 6.6%. No hypoglycemia noted. She has CKD 4 and she is here for repeat kidney function.   She also has hypothyroidism and take levothyroxine  75 mcg daily. She is due for TSH today.  Has a history of meningioma noted on MRI done in May 2024 and this need to be followed by repeat MRI this May/June.  Past Medical History Past Medical History:  Diagnosis Date   Abnormal EKG    suggestive of previous anterior wall myocardial infarction   Atrial fibrillation (HCC)    On Eliquis    Diabetes mellitus (HCC)    Hypertension    Hypothyroidism      Allergies Allergies  Allergen Reactions   Amoxicillin -Pot Clavulanate Diarrhea and Other (See Comments)    Drowsy     Brimonidine Other (See Comments)    Conj follicles and injection    Lisinopril Swelling and Other (See Comments)    Angioedema welling lips, throat closing     Brimonidine Tartrate-Timolol  Other (See Comments)    Redness in eye   Zolpidem Swelling    Lips swell     Review of Systems Review of Systems  Constitutional: Negative.   HENT: Negative.    Respiratory: Negative.    Cardiovascular: Negative.   Gastrointestinal: Negative.   Neurological: Negative.        Objective:    Vitals BP (!) 140/80 (BP Location: Left Arm, Patient Position: Sitting, Cuff Size: Normal)   Pulse 66   Temp 97.9 F (36.6 C)   Resp 18   Wt 125 lb (56.7 kg)   SpO2 99%   BMI 22.86 kg/m    Physical Examination Physical  Exam Constitutional:      Appearance: Normal appearance.  HENT:     Head: Normocephalic and atraumatic.  Cardiovascular:     Rate and Rhythm: Normal rate and regular rhythm.     Heart sounds: Normal heart sounds.  Pulmonary:     Effort: Pulmonary effort is normal.     Breath sounds: Normal breath sounds.  Abdominal:     General: Bowel sounds are normal.     Palpations: Abdomen is soft.  Neurological:     General: No focal deficit present.     Mental Status: She is alert and oriented to person, place, and time.        Assessment & Plan:   Hypothyroidism I will do TSH level today  Abnormal liver function I will repeat liver function today and schedule ultrasound liver  Dyslipidemia Her LDL is target controlled.  Diabetes mellitus due to underlying condition with stage 3a chronic kidney disease (HCC) Her sugar is well controlled and will repeat her renal function.  Benign essential HTN Controlled.    Return in about 3 months (around 09/15/2023).   Roetta Dare, MD

## 2023-06-17 NOTE — Assessment & Plan Note (Signed)
 Her sugar is well controlled and will repeat her renal function.

## 2023-06-17 NOTE — Assessment & Plan Note (Signed)
 I will repeat liver function today and schedule ultrasound liver

## 2023-06-17 NOTE — Assessment & Plan Note (Signed)
 Controlled.

## 2023-06-23 LAB — COMPREHENSIVE METABOLIC PANEL
ALT: 25 [IU]/L (ref 0–32)
AST: 31 [IU]/L (ref 0–40)
Albumin: 3.9 g/dL (ref 3.7–4.7)
Alkaline Phosphatase: 99 [IU]/L (ref 44–121)
BUN/Creatinine Ratio: 14 (ref 12–28)
BUN: 28 mg/dL — ABNORMAL HIGH (ref 8–27)
Bilirubin Total: 0.3 mg/dL (ref 0.0–1.2)
CO2: 21 mmol/L (ref 20–29)
Calcium: 8.5 mg/dL — ABNORMAL LOW (ref 8.7–10.3)
Chloride: 104 mmol/L (ref 96–106)
Creatinine, Ser: 2.04 mg/dL — ABNORMAL HIGH (ref 0.57–1.00)
Globulin, Total: 2.7 g/dL (ref 1.5–4.5)
Glucose: 180 mg/dL — ABNORMAL HIGH (ref 70–99)
Potassium: 4.9 mmol/L (ref 3.5–5.2)
Sodium: 139 mmol/L (ref 134–144)
Total Protein: 6.6 g/dL (ref 6.0–8.5)
eGFR: 23 mL/min/{1.73_m2} — ABNORMAL LOW (ref >59)

## 2023-06-23 LAB — TSH: TSH: 2.93 u[IU]/mL (ref 0.450–4.500)

## 2023-06-24 ENCOUNTER — Other Ambulatory Visit: Payer: Self-pay | Admitting: Internal Medicine

## 2023-06-24 MED ORDER — DAPAGLIFLOZIN PROPANEDIOL 10 MG PO TABS: 10.0000 mg | ORAL_TABLET | Freq: Every day | ORAL | 4 refills | Status: DC

## 2023-06-24 NOTE — Progress Notes (Signed)
 I have spoken with Cheryl Mora about worsening renal function. I will start on farxiga 10 mg daily and if renal function not better then will consider referring her to see nephrologist.

## 2023-07-03 ENCOUNTER — Other Ambulatory Visit: Payer: Self-pay | Admitting: Internal Medicine

## 2023-07-03 MED ORDER — DAPAGLIFLOZIN PROPANEDIOL 10 MG PO TABS: 10.0000 mg | ORAL_TABLET | Freq: Every day | ORAL | 4 refills | Status: DC

## 2023-07-29 ENCOUNTER — Ambulatory Visit: Payer: Medicare HMO | Admitting: Internal Medicine

## 2023-07-30 ENCOUNTER — Ambulatory Visit (HOSPITAL_BASED_OUTPATIENT_CLINIC_OR_DEPARTMENT_OTHER): Payer: Medicare HMO | Admitting: Cardiology

## 2023-08-05 ENCOUNTER — Ambulatory Visit: Payer: Medicare HMO | Admitting: Internal Medicine

## 2023-08-05 VITALS — BP 122/78 | HR 66 | Temp 98.0°F | Resp 18 | Wt 125.0 lb

## 2023-08-05 DIAGNOSIS — M546 Pain in thoracic spine: Secondary | ICD-10-CM | POA: Insufficient documentation

## 2023-08-05 NOTE — Assessment & Plan Note (Signed)
 She will put warm compresses to right shoulder blade. Few times a day

## 2023-08-05 NOTE — Progress Notes (Unsigned)
   Acute Office Visit  Subjective:     Patient ID: Cheryl Mora, female    DOB: Feb 07, 1933, 89 y.o.   MRN: [id number]  Chief Complaint  Patient presents with   office visit    Low right side pain    HPI Patient is in today for right shoulder blade pain for 2 weeks. She says she started doing strengthening exercises and this probably triggered that pain, No cough, SOB.  She also is concerned about small area of her scalp that irritate her particularly when she comb her hair.   She also says that farxiga cost her 2000$ and she wanted to know if she can stop it. Her blood sugar been stable.   Review of Systems  Constitutional: Negative.   Musculoskeletal:  Positive for back pain.  Skin:        Thick spot on her scalp  Neurological: Negative.         Objective:    BP 122/78 (BP Location: Left Arm, Patient Position: Sitting, Cuff Size: Normal)   Pulse 66   Temp 98 F (36.7 C)   Resp 18   Wt 125 lb (56.7 kg)   SpO2 96%   BMI 22.86 kg/m    Physical Exam Constitutional:      Appearance: Normal appearance.  HENT:     Head: Normocephalic and atraumatic.  Cardiovascular:     Rate and Rhythm: Normal rate and regular rhythm.     Heart sounds: Normal heart sounds.  Pulmonary:     Effort: Pulmonary effort is normal.     Breath sounds: Normal breath sounds.  Skin:    Comments: She has thick nodule on her scalp  Neurological:     Mental Status: She is alert.     No results found for any visits on 08/05/23.      Assessment & Plan:   Problem List Items Addressed This Visit       Other   Acute right-sided thoracic back pain - Primary   She will put warm compresses to right shoulder blade. Few times a day     I have removed thick scalp build up from scalp. Area was cleaned, and bleeding was stopped with pressure and local antibiotic was applied and she will continue to apply antibiotic twice a day for 5 days. If any sign of infection, then she will call.  No  orders of the defined types were placed in this encounter.   No follow-ups on file.  Eloisa Northern, MD

## 2023-08-26 ENCOUNTER — Other Ambulatory Visit: Payer: Self-pay | Admitting: Internal Medicine

## 2023-08-26 ENCOUNTER — Encounter: Payer: Self-pay | Admitting: Internal Medicine

## 2023-08-26 ENCOUNTER — Ambulatory Visit: Admitting: Internal Medicine

## 2023-08-26 VITALS — Temp 98.3°F | Resp 18 | Ht 62.0 in | Wt 127.0 lb

## 2023-08-26 DIAGNOSIS — N184 Chronic kidney disease, stage 4 (severe): Secondary | ICD-10-CM | POA: Diagnosis not present

## 2023-08-26 DIAGNOSIS — R55 Syncope and collapse: Secondary | ICD-10-CM | POA: Diagnosis not present

## 2023-08-26 DIAGNOSIS — G4700 Insomnia, unspecified: Secondary | ICD-10-CM | POA: Diagnosis not present

## 2023-08-26 DIAGNOSIS — R5383 Other fatigue: Secondary | ICD-10-CM

## 2023-08-26 NOTE — Assessment & Plan Note (Signed)
 She got dizzy at nighttime probably due to diphenhydramine that she take an Tylenol PM at nighttime.  I have discussed with her to stop this medicine.

## 2023-08-26 NOTE — Assessment & Plan Note (Signed)
 I will repeat renal function and CBC.  I will refer her to see [facility] for stage IV chronic kidney disease.

## 2023-08-26 NOTE — Progress Notes (Signed)
   Office Visit  Subjective   Patient ID: Cheryl Mora   DOB: 1932-08-22   Age: 89 y.o.   MRN: [id number]   Chief Complaint Chief Complaint  Patient presents with   office visit    Patient fell at home felt very dizzy , no passing out      History of Present Illness 89 years old female who went to bathroom last night. She felt dizzy going to bathroom and dizziness was worse when she was coming to her bed and she fell before she reach reach her bed. She press call button and security people came, they helped her back to bed. No syncope. She does not get dizzy. She denies any injury from fall. She says lately she felt tired easily. She made an appointment with cardiologist in September 15, 2023. She took tylenol PM the night before. She is feeling fine now except she as seasonal allergies.  She takes Zyrtec and Flonase at nighttime that does help.  She has a history of atrial fibrillation and is on amiodarone and Eliquis, she also has a chronic kidney disease stage IV.  GFR 2 months ago was 23.   Past Medical History Past Medical History:  Diagnosis Date   Abnormal EKG    suggestive of previous anterior wall myocardial infarction   Atrial fibrillation (HCC)    On Eliquis   Diabetes mellitus (HCC)    Hypertension    Hypothyroidism      Allergies Allergies  Allergen Reactions   Amoxicillin-Pot Clavulanate Diarrhea and Other (See Comments)    Drowsy     Brimonidine Other (See Comments)    Conj follicles and injection    Lisinopril Swelling and Other (See Comments)    Angioedema welling lips, throat closing     Brimonidine Tartrate-Timolol Other (See Comments)    Redness in eye   Zolpidem Swelling    Lips swell     Review of Systems Review of Systems  HENT:  Positive for congestion.   Respiratory: Negative.    Cardiovascular: Negative.        Objective:    Vitals Temp 98.3 F (36.8 C)   Resp 18   Ht 5\' 2"  (1.575 m)   Wt 127 lb (57.6 kg)   SpO2 98% Comment: 93  standing    laying 93  BMI 23.23 kg/m    Physical Examination Physical Exam Constitutional:      Appearance: Normal appearance.  Cardiovascular:     Rate and Rhythm: Normal rate and regular rhythm.     Heart sounds: Normal heart sounds.  Pulmonary:     Effort: Pulmonary effort is normal.     Breath sounds: Normal breath sounds.  Abdominal:     General: Bowel sounds are normal.     Palpations: Abdomen is soft.  Neurological:     Mental Status: She is alert.        Assessment & Plan:   Near syncope She got dizzy at nighttime probably due to diphenhydramine that she take an Tylenol PM at nighttime.  I have discussed with her to stop this medicine.  Stage 4 chronic kidney disease (HCC) I will repeat renal function and CBC.  I will refer her to see [facility] for stage IV chronic kidney disease.  Fatigue I will do CBC, she has made an appointment to see heart doctor on April 7.    No follow-ups on file.   Eloisa Northern, MD

## 2023-08-26 NOTE — Assessment & Plan Note (Signed)
 I will do CBC, she has made an appointment to see heart doctor on April 7.

## 2023-08-27 LAB — COMPREHENSIVE METABOLIC PANEL
ALT: 203 IU/L — ABNORMAL HIGH (ref 0–32)
AST: 292 IU/L — ABNORMAL HIGH (ref 0–40)
Albumin: 3.7 g/dL (ref 3.7–4.7)
Alkaline Phosphatase: 117 IU/L (ref 44–121)
BUN/Creatinine Ratio: 14 (ref 12–28)
BUN: 24 mg/dL (ref 8–27)
Bilirubin Total: 0.5 mg/dL (ref 0.0–1.2)
CO2: 20 mmol/L (ref 20–29)
Calcium: 8.6 mg/dL — ABNORMAL LOW (ref 8.7–10.3)
Chloride: 103 mmol/L (ref 96–106)
Creatinine, Ser: 1.75 mg/dL — ABNORMAL HIGH (ref 0.57–1.00)
Globulin, Total: 2.8 g/dL (ref 1.5–4.5)
Glucose: 148 mg/dL — ABNORMAL HIGH (ref 70–99)
Potassium: 4.7 mmol/L (ref 3.5–5.2)
Sodium: 138 mmol/L (ref 134–144)
Total Protein: 6.5 g/dL (ref 6.0–8.5)
eGFR: 28 mL/min/{1.73_m2} — ABNORMAL LOW (ref >59)

## 2023-08-27 LAB — CBC WITH DIFFERENTIAL/PLATELET
Basophils Absolute: 0 10*3/uL (ref 0.0–0.2)
Basos: 0 %
EOS (ABSOLUTE): 0 10*3/uL (ref 0.0–0.4)
Eos: 0 %
Hematocrit: 33.5 % — ABNORMAL LOW (ref 34.0–46.6)
Hemoglobin: 10.2 g/dL — ABNORMAL LOW (ref 11.1–15.9)
Immature Grans (Abs): 0 10*3/uL (ref 0.0–0.1)
Immature Granulocytes: 0 %
Lymphocytes Absolute: 1.2 10*3/uL (ref 0.7–3.1)
Lymphs: 14 %
MCH: 27.6 pg (ref 26.6–33.0)
MCHC: 30.4 g/dL — ABNORMAL LOW (ref 31.5–35.7)
MCV: 91 fL (ref 79–97)
Monocytes Absolute: 0.9 10*3/uL (ref 0.1–0.9)
Monocytes: 10 %
Neutrophils Absolute: 6.8 10*3/uL (ref 1.4–7.0)
Neutrophils: 76 %
Platelets: 238 10*3/uL (ref 150–450)
RBC: 3.69 x10E6/uL — ABNORMAL LOW (ref 3.77–5.28)
RDW: 14.4 % (ref 11.7–15.4)
WBC: 9 10*3/uL (ref 3.4–10.8)

## 2023-09-01 ENCOUNTER — Other Ambulatory Visit: Payer: Self-pay | Admitting: Internal Medicine

## 2023-09-11 ENCOUNTER — Ambulatory Visit

## 2023-09-14 NOTE — Progress Notes (Unsigned)
 Cardiology Office Note:  .   Date:  09/15/2023  ID:  Cheryl Mora, DOB 10-Sep-1932, MRN [id number] PCP: Cheryl Northern, MD  [facility] Providers Cardiologist:  Cheryl Red, MD    Patient Profile: .      PMH Hyperlipidemia Hypertension PAF Hypothyroidism Type 2 diabetes mellitus  Seen 03/02/2020 as new consult for DOE. LHC 03/06/2020 with NICM, reduced EF, new A-fib RVR, nonobstructive coronary artery disease.  Admission 02/2022 with PNA requiring BiPAP.  Last cardiology clinic visit was 03/24/2023 with [name], NP.  She was accompanied by her daughter.  She was having difficulty with diarrhea and saw PCP 03/18/2023 who recommended Imodium.  She started a probiotic the day prior.  She was previously drinking 3-4 martinis every night which she had stopped and had improvement in liver enzymes.  Kidney function showed sharp decline recently 02/2022 creatinine >> 0.94 >>1.45 (May 2024) >> 2.05 (02/2023) and 2.39 (03/18/23).  She was encouraged to consider referral to nephrology with PCP.  Her metformin has been discontinued.  She was taking Eliquis 2.5 mg twice daily which is appropriate dose based on age and creatinine.  She was continued on amiodarone and advised to return in 4 to 6 months for follow-up.       History of Present Illness: Cheryl Mora is a very pleasant 89 y.o. female who is here today for follow-up of NICM. She is accompanied by her daughter. She reports she did not sleep well last night, was up every hour with urinating and her mind racing. She thinks this may be contributing to her elevated BP.  She does not monitor BP at home but does have BP checked periodically at [facility] and these have been good.  She exercises three days a week, doing seated aerobics and balance exercises, and does not report any shortness of breath or chest pain. Occasionally notes irregular heartbeat but nothing prolonged or bothersome. States she has been told she has stage IV  CKD and is seeking advice on diet and medication that could affect kidney function. She drinks at least five 12-ounce glasses of water a day and consuming one or two alcoholic drinks before dinner. Occasional lightheadedness but no presyncope or syncope. No significant bleeding concerts but she does have skin discoloration and thinning on the legs.  Discussed the use of AI scribe software for clinical note transcription with the patient, who gave verbal consent to proceed.   ROS: See HPI       Studies Reviewed: Marland Kitchen   EKG Interpretation Date/Time:  Monday September 15 2023 15:51:23 EDT Ventricular Rate:  63 PR Interval:  176 QRS Duration:  98 QT Interval:  480 QTC Calculation: 491 R Axis:   -47  Text Interpretation: Normal sinus rhythm Left axis deviation Pulmonary disease pattern Minimal voltage criteria for LVH, may be normal variant ( Cornell product ) Septal infarct , age undetermined No acute changes Confirmed by Eligha Bridegroom [phone]) on 09/15/2023 3:55:34 PM     No results found for: "LIPOA"   Risk Assessment/Calculations:    CHA2DS2-VASc Score = 6   This indicates a 9.7% annual risk of stroke. The patient's score is based upon: CHF History: 1 HTN History: 1 Diabetes History: 0 Stroke History: 0 Vascular Disease History: 1 Age Score: 2 Gender Score: 1    HYPERTENSION CONTROL Vitals:   09/15/23 1545 09/15/23 1611  BP: (!) 168/76 (!) 164/92    The patient's blood pressure is elevated above target today.  In order to address the patient's elevated BP: Blood pressure will be monitored at home to determine if medication changes need to be made.          Physical Exam:   VS:  BP (!) 164/92   Pulse 67   Ht 5\' 2"  (1.575 m)   Wt 126 lb (57.2 kg)   SpO2 97%   BMI 23.05 kg/m    Wt Readings from Last 3 Encounters:  09/15/23 126 lb (57.2 kg)  08/26/23 127 lb (57.6 kg)  08/05/23 125 lb (56.7 kg)    GEN: Well nourished, well developed in no acute distress NECK: No JVD;  Carotid bruit on left CARDIAC: RRR, no murmurs, rubs, gallops RESPIRATORY:  Clear to auscultation without rales, wheezing or rhonchi  ABDOMEN: Soft, non-tender, non-distended EXTREMITIES:  No edema; No deformity     ASSESSMENT AND PLAN: .    Carotid bruit: She has a significant bruit on the left side of her neck.She is also having lightheadedness. Brain MRI from 11/06/2022 reviewed which revealed irregularity of distal extracranial segments of bilateral internal carotid arteries possibly representative of fibromuscular dysplasia.  We will get neck CTA for further evaluation.   PAF on chronic anticoagulation: EKG today reveals sinus rhythm at 63 bpm.  She denies tachypalpitations. As noted below, due to elevated transaminases, we will reduce amiodarone to 100 mg daily and recheck LFTs in 2 weeks.  No significant bleeding concerns.  Continue Eliquis 2.5 mg twice daily which is appropriate dose for stroke prevention for CHA2DS2-VASc score of 6.   High risk medication monitoring/Elevated LFTs: Significantly elevated liver enzymes on labs 08/26/2023 with AST of 292 and ALT of 203.  She is on amiodarone for A-fib.  We will have her reduce amiodarone to 100 mg daily and have repeat LFTs in 2 weeks. TSH was normal on labs completed 06/17/2023. EKG today is unremarkable. She admits to continuing to drink Etoh nightly. We will likely have to d/c amiodarone but will await repeat labs in 2 weeks.   NICM with recovered LVEF: Admission 02/2020 with newly reduced EF in the setting of AF RVR. EF normalized on echo completed 09/2020.  She denies shortness of breath, orthopnea, PND, edema.  Reports weight is stable.  She appears euvolemic on exam.  CKD: SCr 1.75, eGFR 28 on labs completed 08/26/2023. Continue avoidance of nephrotoxic agents. We discussed healthy diet and good hydration to limit further progression. Advised her to discuss referral to nephrology with PCP.   Nonobstructive CAD: LHC 03/06/2020 with 30% prox RCA  lesion, no obstructive CAD. She denies chest pain, dyspnea, or other symptoms concerning for angina.  No indication for further ischemic evaluation at this time. Focus on secondary prevention including heart healthy mostly plant based diet avoiding saturated fat, processed foods, simple carbohydrates, and sugar along with aiming for at least 150 minutes of moderate intensity exercise each week.   Labile hypertension: BP is elevated and remains elevated on my recheck.  She admits she slept very poorly last night and thinks this may be contributing.  She does not have home monitor, but can have BP checked by nurse at [facility].  I have asked her to report BP in 2 weeks.  Goal < 140/80.   Hyperlipidemia LDL < 70: Lipid panel completed 02/11/2023 with total cholesterol 127, triglycerides 83, HDL 62, and LDL-C 49.  Lipids are well-controlled. Continue rosuvastatin.       Disposition:3 months with me  Signed, Eligha Bridegroom, NP-C

## 2023-09-15 ENCOUNTER — Encounter (HOSPITAL_BASED_OUTPATIENT_CLINIC_OR_DEPARTMENT_OTHER): Payer: Self-pay | Admitting: Nurse Practitioner

## 2023-09-15 ENCOUNTER — Ambulatory Visit (HOSPITAL_BASED_OUTPATIENT_CLINIC_OR_DEPARTMENT_OTHER): Payer: Medicare HMO | Admitting: Nurse Practitioner

## 2023-09-15 VITALS — BP 164/92 | HR 67 | Ht 62.0 in | Wt 126.0 lb

## 2023-09-15 DIAGNOSIS — Z7901 Long term (current) use of anticoagulants: Secondary | ICD-10-CM

## 2023-09-15 DIAGNOSIS — R7989 Other specified abnormal findings of blood chemistry: Secondary | ICD-10-CM

## 2023-09-15 DIAGNOSIS — N184 Chronic kidney disease, stage 4 (severe): Secondary | ICD-10-CM

## 2023-09-15 DIAGNOSIS — I10 Essential (primary) hypertension: Secondary | ICD-10-CM

## 2023-09-15 DIAGNOSIS — R0989 Other specified symptoms and signs involving the circulatory and respiratory systems: Secondary | ICD-10-CM | POA: Diagnosis not present

## 2023-09-15 DIAGNOSIS — I773 Arterial fibromuscular dysplasia: Secondary | ICD-10-CM | POA: Diagnosis not present

## 2023-09-15 DIAGNOSIS — E785 Hyperlipidemia, unspecified: Secondary | ICD-10-CM

## 2023-09-15 DIAGNOSIS — I4819 Other persistent atrial fibrillation: Secondary | ICD-10-CM

## 2023-09-15 DIAGNOSIS — N1832 Chronic kidney disease, stage 3b: Secondary | ICD-10-CM

## 2023-09-15 DIAGNOSIS — Z79899 Other long term (current) drug therapy: Secondary | ICD-10-CM | POA: Diagnosis not present

## 2023-09-15 DIAGNOSIS — I2510 Atherosclerotic heart disease of native coronary artery without angina pectoris: Secondary | ICD-10-CM

## 2023-09-15 DIAGNOSIS — I428 Other cardiomyopathies: Secondary | ICD-10-CM

## 2023-09-15 MED ORDER — AMIODARONE HCL 100 MG PO TABS
100.0000 mg | ORAL_TABLET | Freq: Every day | ORAL | 0 refills | Status: DC
Start: 2023-09-15 — End: 2023-12-05

## 2023-09-15 NOTE — Patient Instructions (Signed)
 Medication Instructions:  DECREASE YOUR AMIODARONE TO 100 MG DAILY   *If you need a refill on your cardiac medications before your next appointment, please call your pharmacy*  Lab Work: LIVER FUNCTIONS IN 2 WEEKS   If you have labs (blood work) drawn today and your tests are completely normal, you will receive your results only by: MyChart Message (if you have MyChart) OR A paper copy in the mail If you have any lab test that is abnormal or we need to change your treatment, we will call you to review the results.  Testing/Procedures: Non-Cardiac CT Angiography (CTA), is a special type of CT scan that uses a computer to produce multi-dimensional views of major blood vessels throughout the body. In CT angiography, a contrast material is injected through an IV to help visualize the blood vessels OF NECK   Follow-Up: At [facility], you and your health needs are our priority.  As part of our continuing mission to provide you with exceptional heart care, our providers are all part of one team.  This team includes your primary Cardiologist (physician) and Advanced Practice Providers or APPs (Physician Assistants and Nurse Practitioners) who all work together to provide you with the care you need, when you need it.  Your next appointment:   3 month(s)  Provider:   Epifanio Lesches, MD or Eligha Bridegroom, NP    We recommend signing up for the patient portal called "MyChart".  Sign up information is provided on this After Visit Summary.  MyChart is used to connect with patients for Virtual Visits (Telemedicine).  Patients are able to view lab/test results, encounter notes, upcoming appointments, etc.  Non-urgent messages can be sent to your provider as well.   To learn more about what you can do with MyChart, go to [email or web address].   Other Instructions MONITOR YOUR BLOOD PRESSURE DAILY OVER THE NEXT 2 WEEKS WITH GOAL OF LESS THAN 140  CALL THE OFFICE WITH UPDATED  READINGS

## 2023-09-16 ENCOUNTER — Ambulatory Visit: Payer: Medicare HMO | Admitting: Internal Medicine

## 2023-09-23 ENCOUNTER — Ambulatory Visit: Admitting: Internal Medicine

## 2023-10-06 ENCOUNTER — Ambulatory Visit (HOSPITAL_BASED_OUTPATIENT_CLINIC_OR_DEPARTMENT_OTHER)
Admission: RE | Admit: 2023-10-06 | Discharge: 2023-10-06 | Disposition: A | Discharge status: Home / Self Care | Source: Ambulatory Visit | Attending: Nurse Practitioner | Admitting: Nurse Practitioner

## 2023-10-06 ENCOUNTER — Encounter (HOSPITAL_BASED_OUTPATIENT_CLINIC_OR_DEPARTMENT_OTHER): Payer: Self-pay

## 2023-10-06 DIAGNOSIS — R7989 Other specified abnormal findings of blood chemistry: Secondary | ICD-10-CM | POA: Diagnosis not present

## 2023-10-06 DIAGNOSIS — R0989 Other specified symptoms and signs involving the circulatory and respiratory systems: Secondary | ICD-10-CM | POA: Diagnosis not present

## 2023-10-06 LAB — POCT I-STAT CREATININE: Creatinine, Ser: 1.7 mg/dL — ABNORMAL HIGH (ref 0.44–1.00)

## 2023-10-07 ENCOUNTER — Telehealth (HOSPITAL_BASED_OUTPATIENT_CLINIC_OR_DEPARTMENT_OTHER): Payer: Self-pay | Admitting: Nurse Practitioner

## 2023-10-07 ENCOUNTER — Encounter (HOSPITAL_BASED_OUTPATIENT_CLINIC_OR_DEPARTMENT_OTHER): Payer: Self-pay

## 2023-10-07 DIAGNOSIS — R0989 Other specified symptoms and signs involving the circulatory and respiratory systems: Secondary | ICD-10-CM

## 2023-10-07 LAB — HEPATIC FUNCTION PANEL
ALT: 37 IU/L — ABNORMAL HIGH (ref 0–32)
AST: 43 IU/L — ABNORMAL HIGH (ref 0–40)
Albumin: 3.9 g/dL (ref 3.7–4.7)
Alkaline Phosphatase: 139 IU/L — ABNORMAL HIGH (ref 44–121)
Bilirubin Total: 0.4 mg/dL (ref 0.0–1.2)
Bilirubin, Direct: 0.19 mg/dL (ref 0.00–0.40)
Total Protein: 6.4 g/dL (ref 6.0–8.5)

## 2023-10-07 NOTE — Telephone Encounter (Signed)
**Note De-identified  [place] Obfuscation** Please advise 

## 2023-10-07 NOTE — Telephone Encounter (Signed)
 Please order vascular carotid duplex. Her kidney function is too poor for CT. Thank you.

## 2023-10-07 NOTE — Telephone Encounter (Signed)
 Patient was scheduled for ct angio neck but was cancelled to labs being out of acceptable range. When I called to reschedule she had stated that she was told that was to another test. Is this accurate or can I proceed to reschedule ct?

## 2023-10-27 ENCOUNTER — Ambulatory Visit (HOSPITAL_BASED_OUTPATIENT_CLINIC_OR_DEPARTMENT_OTHER)

## 2023-10-27 ENCOUNTER — Encounter (HOSPITAL_BASED_OUTPATIENT_CLINIC_OR_DEPARTMENT_OTHER)

## 2023-10-27 ENCOUNTER — Ambulatory Visit (HOSPITAL_BASED_OUTPATIENT_CLINIC_OR_DEPARTMENT_OTHER): Payer: Self-pay | Admitting: Nurse Practitioner

## 2023-10-27 DIAGNOSIS — R0989 Other specified symptoms and signs involving the circulatory and respiratory systems: Secondary | ICD-10-CM | POA: Diagnosis not present

## 2023-11-07 ENCOUNTER — Other Ambulatory Visit: Payer: Self-pay

## 2023-11-07 MED ORDER — LEVOTHYROXINE SODIUM 75 MCG PO TABS
75.0000 ug | ORAL_TABLET | Freq: Every day | ORAL | 1 refills | Status: DC
Start: 2023-11-07 — End: 2024-04-05

## 2023-11-17 DIAGNOSIS — L905 Scar conditions and fibrosis of skin: Secondary | ICD-10-CM | POA: Diagnosis not present

## 2023-11-17 DIAGNOSIS — D1801 Hemangioma of skin and subcutaneous tissue: Secondary | ICD-10-CM | POA: Diagnosis not present

## 2023-11-17 DIAGNOSIS — L853 Xerosis cutis: Secondary | ICD-10-CM | POA: Diagnosis not present

## 2023-11-17 DIAGNOSIS — L814 Other melanin hyperpigmentation: Secondary | ICD-10-CM | POA: Diagnosis not present

## 2023-11-17 DIAGNOSIS — L821 Other seborrheic keratosis: Secondary | ICD-10-CM | POA: Diagnosis not present

## 2023-11-17 DIAGNOSIS — L570 Actinic keratosis: Secondary | ICD-10-CM | POA: Diagnosis not present

## 2023-11-21 ENCOUNTER — Encounter (HOSPITAL_BASED_OUTPATIENT_CLINIC_OR_DEPARTMENT_OTHER)

## 2023-11-26 DIAGNOSIS — N179 Acute kidney failure, unspecified: Secondary | ICD-10-CM | POA: Diagnosis not present

## 2023-11-26 DIAGNOSIS — I129 Hypertensive chronic kidney disease with stage 1 through stage 4 chronic kidney disease, or unspecified chronic kidney disease: Secondary | ICD-10-CM | POA: Diagnosis not present

## 2023-11-26 DIAGNOSIS — E1129 Type 2 diabetes mellitus with other diabetic kidney complication: Secondary | ICD-10-CM | POA: Diagnosis not present

## 2023-11-26 DIAGNOSIS — N184 Chronic kidney disease, stage 4 (severe): Secondary | ICD-10-CM | POA: Diagnosis not present

## 2023-11-26 DIAGNOSIS — R809 Proteinuria, unspecified: Secondary | ICD-10-CM | POA: Diagnosis not present

## 2023-11-26 DIAGNOSIS — N1832 Chronic kidney disease, stage 3b: Secondary | ICD-10-CM | POA: Diagnosis not present

## 2023-11-26 DIAGNOSIS — E1122 Type 2 diabetes mellitus with diabetic chronic kidney disease: Secondary | ICD-10-CM | POA: Diagnosis not present

## 2023-11-29 LAB — LAB REPORT - SCANNED
Creatinine, POC: 47.5 mg/dL
EGFR: 33

## 2023-12-02 ENCOUNTER — Other Ambulatory Visit (HOSPITAL_BASED_OUTPATIENT_CLINIC_OR_DEPARTMENT_OTHER): Payer: Self-pay | Admitting: Nurse Practitioner

## 2023-12-02 DIAGNOSIS — I4819 Other persistent atrial fibrillation: Secondary | ICD-10-CM

## 2023-12-16 ENCOUNTER — Ambulatory Visit: Admitting: Internal Medicine

## 2023-12-16 ENCOUNTER — Encounter: Payer: Self-pay | Admitting: Internal Medicine

## 2023-12-16 VITALS — BP 128/90 | HR 72 | Temp 97.8°F | Resp 18 | Ht 62.0 in | Wt 126.4 lb

## 2023-12-16 DIAGNOSIS — S8011XA Contusion of right lower leg, initial encounter: Secondary | ICD-10-CM | POA: Diagnosis not present

## 2023-12-16 NOTE — Progress Notes (Unsigned)
   Acute Office Visit  Subjective:     Patient ID: Cheryl Mora, female    DOB: 09/03/1932, 89 y.o.   MRN: [id number]  Chief Complaint  Patient presents with  . office visit    1 week ago hematoma on right leg, since last night got worse     HPI Patient is in today for ***  ROS      Objective:    BP (!) 128/90   Pulse 72   Temp 97.8 F (36.6 C)   Resp 18   Ht 5' 2 (1.575 m)   Wt 126 lb 6 oz (57.3 kg)   SpO2 98%   BMI 23.11 kg/m  {Vitals History (Optional):23777}  Physical Exam  No results found for any visits on 12/16/23.      Assessment & Plan:   Problem List Items Addressed This Visit       Other   Contusion of right lower leg - Primary    No orders of the defined types were placed in this encounter.   No follow-ups on file.  Roetta Dare, MD

## 2023-12-17 NOTE — Assessment & Plan Note (Signed)
 She probably bumped her leg not realizing.  I have applied a bandage around blister and discussed with her that if blister burst then she need to keep it clean and come back to see us .  Will continue to monitor.

## 2023-12-22 ENCOUNTER — Ambulatory Visit (HOSPITAL_BASED_OUTPATIENT_CLINIC_OR_DEPARTMENT_OTHER): Admitting: Nurse Practitioner

## 2023-12-22 DIAGNOSIS — L570 Actinic keratosis: Secondary | ICD-10-CM | POA: Diagnosis not present

## 2023-12-22 DIAGNOSIS — L853 Xerosis cutis: Secondary | ICD-10-CM | POA: Diagnosis not present

## 2024-01-07 NOTE — Progress Notes (Unsigned)
 Cardiology Office Note:  .   Date:  01/08/2024  ID:  Cheryl Mora, DOB [birth year], MRN [id number] PCP: Caleen Dirks, MD  [facility] Providers Cardiologist:  Shelda Bruckner, MD    Patient Profile: .      PMH Hyperlipidemia Hypertension PAF Hypothyroidism Type 2 diabetes mellitus  Seen 03/02/2020 as new consult for DOE. LHC 03/06/2020 with NICM, reduced EF, new A-fib RVR, nonobstructive coronary artery disease.  Admission 02/2022 with PNA requiring BiPAP.  Last cardiology clinic visit was 03/24/2023 with Reche Finder, NP.  She was accompanied by her daughter.  She was having difficulty with diarrhea and saw PCP 03/18/2023 who recommended Imodium .  She started a probiotic the day prior.  She was previously drinking 3-4 martinis every night which she had stopped and had improvement in liver enzymes.  Kidney function showed sharp decline recently 02/2022 creatinine >> 0.94 >>1.45 (May 2024) >> 2.05 (02/2023) and 2.39 (03/18/23).  She was encouraged to consider referral to nephrology with PCP.  Her metformin has been discontinued.  She was taking Eliquis  2.5 mg twice daily which is appropriate dose based on age and creatinine.  She was continued on amiodarone  and advised to return in 4 to 6 months for follow-up.  Seen in follow-up by me on 09/15/23, accompanied by her daughter. Did not sleep well the previous night, up every hour to urinate and mind racing which she feels is contributing to her elevated BP.  Does not monitor BP at home but does have BP checked periodically at [facility] and these have been good.  Exercising three days a week, doing seated aerobics and balance exercises, and not having any shortness of breath or chest pain. Occasionally notes irregular heartbeat but nothing prolonged or bothersome. States she has been told she has stage IV CKD and is seeking advice on diet and medication that could affect kidney function. Drinking at least five 12-ounce glasses of water a  day and consuming one or two alcoholic drinks before dinner. Occasional lightheadedness but no presyncope or syncope. No significant bleeding concerns but she does have skin discoloration and thinning on the legs.  We reduce amiodarone  to 100 mg daily due to elevated transaminases > 200.  Repeat LFT 10/06/2023 revealed AST down to 43 and ALT down to 37.  She was advised to continue amiodarone  100 mg daily.  Neck CTA was ordered due to lightheadedness and brain MRI from 10/2022 which revealed irregularity of distal extracranial segments of her internal carotids representative of FMD. Unfortunately, her Scr was elevated and we proceeded to get carotid duplex which revealed moderate stenosis LICA 60 to 79%, mild RICA stenosis 1-39%, normal flow dynamics in vertebral and subclavian arteries. She was advised to f/u with neurology if lightheadedness persists.        History of Present Illness: .     Discussed the use of AI scribe software for clinical note transcription with the patient, who gave verbal consent to proceed.  History of Present Illness Cheryl Mora is a very pleasant 89 year old female who is here today for follow-up of atrial fibrillation and is accompanied by her daughter. She experienced dizziness today when getting out of the car, which led to a fall in our lobby. She struck her knee but did not strike her head. She has significant bruising to her right leg and a wound on the back of her leg that has been slow to heal.  She thinks the bruising to her leg is occurring from the  edge of her bed in the way that she gets out of bed. She has occasional dizziness and left-sided headache. Aims to walk 2000 steps daily, walking at her facility with the use of rails in the hallways. Admits she is hesitant to use a cane or walker. She denies shortness of breath, chest pain, palpitations, orthopnea, PND, edema. She is concerned about her iron levels. No significant bruising concerns.   Discussed the use  of AI scribe software for clinical note transcription with the patient, who gave verbal consent to proceed.   ROS: See HPI       Studies Reviewed: SABRA        No results found for: LIPOA   Risk Assessment/Calculations:    CHA2DS2-VASc Score = 6   This indicates a 9.7% annual risk of stroke. The patient's score is based upon: CHF History: 1 HTN History: 1 Diabetes History: 0 Stroke History: 0 Vascular Disease History: 1 Age Score: 2 Gender Score: 1            Physical Exam:   VS:  BP 130/80   Pulse (!) 57   Ht 5' 2 (1.575 m)   Wt 130 lb (59 kg)   SpO2 97%   BMI 23.78 kg/m    Wt Readings from Last 3 Encounters:  01/08/24 130 lb (59 kg)  12/16/23 126 lb 6 oz (57.3 kg)  09/15/23 126 lb (57.2 kg)    GEN: Well nourished, well developed in no acute distress NECK: No JVD; Carotid bruit on left CARDIAC: RRR, no murmurs, rubs, gallops RESPIRATORY:  Clear to auscultation without rales, wheezing or rhonchi  ABDOMEN: Soft, non-tender, non-distended EXTREMITIES:  No edema; No deformity     ASSESSMENT AND PLAN: .    Carotid stenosis/lightheadedness: Carotid duplex revealed mild 1 to 39% stenosis R ICA, moderate 60 to 79% stenosis LICA.  She is having lightheadedness and headaches.  Previously seen by neurology with brain MRI from 11/06/2022 which revealed irregularity of distal extracranial segments of bilateral internal carotid arteries possibly representative of fibromuscular dysplasia. Recommend she return to see neurology. Consider referral to vascular surgery.    PAF on chronic anticoagulation: Clinically she appears to be in sinus rhythm today.  HR is well-controlled.  She denies tachypalpitations since reducing dose of amiodarone  a few months ago.  She has some bruising and bleeding on her right leg from recent injuries.  No other significant bleeding concerns.  We will continue Eliquis  2.5 mg twice daily which is appropriate dose for stroke prevention for CHA2DS2-VASc score  of 6.  Per her request, we will check CBC and iron studies today.  High risk medication monitoring/Elevated LFTs: Significantly elevated liver enzymes on labs 08/26/2023 with AST of 292 and ALT of 203. Amiodarone  was reduced to 100 mg daily with improvement in LFTs to AST of 43 and ALT of 37. TSH was normal on labs completed 06/17/2023. Continue to avoid Etoh. Continue amiodarone  at 100 mg daily.   NICM with recovered LVEF: Admission 02/2020 with newly reduced EF in the setting of AF RVR. EF normalized on echo completed 09/2020. She denies shortness of breath, orthopnea, PND, edema. Weight is stable. She appears euvolemic on exam.   CKD Stage IIIb: Saw nephrologist, Dr. Jerrye, in June. Reports kidney function is stable. We requested that labs be faxed to our office. Continue avoidance of nephrotoxic agents. Management per nephrology.    Nonobstructive CAD: LHC 03/06/2020 with 30% prox RCA lesion, no obstructive CAD. She denies chest pain,  dyspnea, or other symptoms concerning for angina.  No indication for further ischemic evaluation at this time. Focus on secondary prevention including heart healthy mostly plant based diet avoiding saturated fat, processed foods, simple carbohydrates, and sugar along with aiming for at least 150 minutes of moderate intensity exercise each week.   Labile blood pressure: BP is elevated initially but improved on my recheck.  She has it checked occasionally by the nurse at [facility].  No significant concerns with recent blood pressure readings. Not currently on anti-hypertensive therapy. Continue routine monitoring.   Hyperlipidemia LDL < 70: Lipid panel completed 02/11/2023 with total cholesterol 127, triglycerides 83, HDL 62, and LDL-C 49. Lipids are well-controlled. Continue rosuvastatin .       Disposition: 5 months with Dr. Lonni  Signed, Rosaline Bane, NP-C

## 2024-01-08 ENCOUNTER — Encounter (HOSPITAL_BASED_OUTPATIENT_CLINIC_OR_DEPARTMENT_OTHER): Payer: Self-pay | Admitting: Nurse Practitioner

## 2024-01-08 ENCOUNTER — Ambulatory Visit (INDEPENDENT_AMBULATORY_CARE_PROVIDER_SITE_OTHER): Admitting: Nurse Practitioner

## 2024-01-08 ENCOUNTER — Telehealth (HOSPITAL_BASED_OUTPATIENT_CLINIC_OR_DEPARTMENT_OTHER): Payer: Self-pay | Admitting: *Deleted

## 2024-01-08 VITALS — BP 130/80 | HR 57 | Ht 62.0 in | Wt 130.0 lb

## 2024-01-08 DIAGNOSIS — R7989 Other specified abnormal findings of blood chemistry: Secondary | ICD-10-CM

## 2024-01-08 DIAGNOSIS — I4819 Other persistent atrial fibrillation: Secondary | ICD-10-CM | POA: Diagnosis not present

## 2024-01-08 DIAGNOSIS — I2510 Atherosclerotic heart disease of native coronary artery without angina pectoris: Secondary | ICD-10-CM

## 2024-01-08 DIAGNOSIS — Z79899 Other long term (current) drug therapy: Secondary | ICD-10-CM | POA: Diagnosis not present

## 2024-01-08 DIAGNOSIS — I428 Other cardiomyopathies: Secondary | ICD-10-CM | POA: Diagnosis not present

## 2024-01-08 DIAGNOSIS — I6523 Occlusion and stenosis of bilateral carotid arteries: Secondary | ICD-10-CM

## 2024-01-08 DIAGNOSIS — Z7901 Long term (current) use of anticoagulants: Secondary | ICD-10-CM

## 2024-01-08 DIAGNOSIS — R42 Dizziness and giddiness: Secondary | ICD-10-CM

## 2024-01-08 DIAGNOSIS — E785 Hyperlipidemia, unspecified: Secondary | ICD-10-CM | POA: Diagnosis not present

## 2024-01-08 DIAGNOSIS — N1832 Chronic kidney disease, stage 3b: Secondary | ICD-10-CM

## 2024-01-08 DIAGNOSIS — R0989 Other specified symptoms and signs involving the circulatory and respiratory systems: Secondary | ICD-10-CM | POA: Diagnosis not present

## 2024-01-08 LAB — CBC
Hematocrit: 29.3 % — ABNORMAL LOW (ref 34.0–46.6)
Hemoglobin: 8.5 g/dL — ABNORMAL LOW (ref 11.1–15.9)
MCH: 24.2 pg — ABNORMAL LOW (ref 26.6–33.0)
MCHC: 29 g/dL — ABNORMAL LOW (ref 31.5–35.7)
MCV: 84 fL (ref 79–97)
Platelets: 257 x10E3/uL (ref 150–450)
RBC: 3.51 x10E6/uL — ABNORMAL LOW (ref 3.77–5.28)
RDW: 14.8 % (ref 11.7–15.4)
WBC: 6.4 x10E3/uL (ref 3.4–10.8)

## 2024-01-08 NOTE — Patient Instructions (Signed)
 Medication Instructions:   Your physician recommends that you continue on your current medications as directed. Please refer to the Current Medication list given to you today.   *If you need a refill on your cardiac medications before your next appointment, please call your pharmacy*  Lab Work:  TODAY!!! IRON PANEL/CBC  If you have labs (blood work) drawn today and your tests are completely normal, you will receive your results only by: MyChart Message (if you have MyChart) OR A paper copy in the mail If you have any lab test that is abnormal or we need to change your treatment, we will call you to review the results.  Testing/Procedures:  None ordered.  Follow-Up: At [facility], you and your health needs are our priority.  As part of our continuing mission to provide you with exceptional heart care, our providers are all part of one team.  This team includes your primary Cardiologist (physician) and Advanced Practice Providers or APPs (Physician Assistants and Nurse Practitioners) who all work together to provide you with the care you need, when you need it.  Your next appointment:   5 month(s)  Provider:   Shelda Bruckner, MD    We recommend signing up for the patient portal called MyChart.  Sign up information is provided on this After Visit Summary.  MyChart is used to connect with patients for Virtual Visits (Telemedicine).  Patients are able to view lab/test results, encounter notes, upcoming appointments, etc.  Non-urgent messages can be sent to your provider as well.   To learn more about what you can do with MyChart, go to [email or web address].

## 2024-01-08 NOTE — Telephone Encounter (Signed)
 S/w with receptionist at [facility] @ [phone].  Will fax pt's recent labs from 7/18 ov.

## 2024-01-09 ENCOUNTER — Other Ambulatory Visit (HOSPITAL_BASED_OUTPATIENT_CLINIC_OR_DEPARTMENT_OTHER): Payer: Self-pay | Admitting: *Deleted

## 2024-01-09 ENCOUNTER — Other Ambulatory Visit (HOSPITAL_BASED_OUTPATIENT_CLINIC_OR_DEPARTMENT_OTHER): Payer: Self-pay

## 2024-01-09 ENCOUNTER — Ambulatory Visit: Payer: Self-pay | Admitting: Nurse Practitioner

## 2024-01-09 DIAGNOSIS — D509 Iron deficiency anemia, unspecified: Secondary | ICD-10-CM

## 2024-01-09 DIAGNOSIS — N1832 Chronic kidney disease, stage 3b: Secondary | ICD-10-CM

## 2024-01-09 DIAGNOSIS — R790 Abnormal level of blood mineral: Secondary | ICD-10-CM

## 2024-01-09 DIAGNOSIS — I480 Paroxysmal atrial fibrillation: Secondary | ICD-10-CM

## 2024-01-09 DIAGNOSIS — Z7901 Long term (current) use of anticoagulants: Secondary | ICD-10-CM

## 2024-01-09 LAB — IRON,TIBC AND FERRITIN PANEL
Ferritin: 14 ng/mL — ABNORMAL LOW (ref 15–150)
Iron Saturation: 5 % — CL (ref 15–55)
Iron: 23 ug/dL — ABNORMAL LOW (ref 27–139)
Total Iron Binding Capacity: 493 ug/dL — ABNORMAL HIGH (ref 250–450)
UIBC: 470 ug/dL — ABNORMAL HIGH (ref 118–369)

## 2024-01-09 MED ORDER — PANTOPRAZOLE SODIUM 40 MG PO TBEC: 40.0000 mg | DELAYED_RELEASE_TABLET | Freq: Every day | ORAL | 11 refills | Status: DC

## 2024-01-09 MED ORDER — FERROUS SULFATE 325 (65 FE) MG PO TABS: 325.0000 mg | ORAL_TABLET | Freq: Two times a day (BID) | ORAL | 11 refills | Status: DC

## 2024-01-11 ENCOUNTER — Other Ambulatory Visit (HOSPITAL_BASED_OUTPATIENT_CLINIC_OR_DEPARTMENT_OTHER): Payer: Self-pay | Admitting: Family

## 2024-01-12 NOTE — Telephone Encounter (Signed)
 Prescription refill request for Eliquis  received. Indication:afib Last office visit:7/25 Scr:1.70  4/25 Age: 89 Weight:59  kg  Prescription refilled

## 2024-01-13 ENCOUNTER — Other Ambulatory Visit: Payer: Self-pay | Admitting: *Deleted

## 2024-01-13 DIAGNOSIS — D509 Iron deficiency anemia, unspecified: Secondary | ICD-10-CM

## 2024-01-13 NOTE — Progress Notes (Signed)
cbcc

## 2024-01-19 DIAGNOSIS — L578 Other skin changes due to chronic exposure to nonionizing radiation: Secondary | ICD-10-CM | POA: Diagnosis not present

## 2024-01-19 DIAGNOSIS — D692 Other nonthrombocytopenic purpura: Secondary | ICD-10-CM | POA: Diagnosis not present

## 2024-01-19 DIAGNOSIS — L821 Other seborrheic keratosis: Secondary | ICD-10-CM | POA: Diagnosis not present

## 2024-01-20 NOTE — Progress Notes (Signed)
 New Hematology/Oncology Consult   Requesting MD: Rosaline Bane, NP  [phone]      Reason for Consult: Iron deficiency anemia  HPI: Ms. Cheryl Mora is an 89 year old woman referred for evaluation of iron deficiency anemia.  She was seen by cardiology at [facility] 01/08/2024.  CBC returned with a hemoglobin of 8.5, MCV 84, white count 6.4, platelet count 257,000; iron studies with TIBC 493, iron 23, iron saturation 5%, ferritin 14.  She was started on oral iron and referred to gastroenterology.  Eliquis , which she was on for atrial fibrillation, has been placed on hold.  CBC 08/26/2023 showed hemoglobin 10.2, MCV 91; 11/06/2022 hemoglobin 11.8, MCV 91.  CBC 02/16/2022 hemoglobin 12.8, MCV 95.     Past Medical History:  Diagnosis Date   Abnormal EKG    suggestive of previous anterior wall myocardial infarction   Atrial fibrillation (HCC)    On Eliquis    Diabetes mellitus (HCC)    Hypertension    Hypothyroidism      Past Surgical History:  Procedure Laterality Date   ABDOMINAL HYSTERECTOMY     CARDIOVERSION N/A 04/13/2020   Procedure: CARDIOVERSION;  Surgeon: Loni Soyla LABOR, MD;  Location: [facility] ENDOSCOPY;  Service: Cardiovascular;  Laterality: N/A;   CATARACT EXTRACTION     KNEE SURGERY     LEFT HEART CATH AND CORONARY ANGIOGRAPHY N/A 03/06/2020   Procedure: LEFT HEART CATH AND CORONARY ANGIOGRAPHY;  Surgeon: Swaziland, Peter M, MD;  Location: [facility] INVASIVE CV LAB;  Service: Cardiovascular;  Laterality: N/A;     Current Outpatient Medications:    amiodarone  (PACERONE ) 100 MG tablet, TAKE 1 TABLET (100 MG TOTAL) BY MOUTH DAILY. DISCONTINUE 200 MG, DOSE CHANGE, Disp: 90 tablet, Rfl: 2   ascorbic acid  (VITAMIN C ) 250 MG CHEW, Chew 250 mg by mouth 2 (two) times daily., Disp: , Rfl:    Bismuth Subsalicylate (PEPTO-BISMOL PO), Take by mouth as needed. Chewable tab, Disp: , Rfl:    calcium  carbonate (TUMS - DOSED IN MG ELEMENTAL CALCIUM ) 500 MG chewable tablet, Chew 1-2 tablets by mouth  daily as needed for indigestion or heartburn., Disp: , Rfl:    Calcium -Magnesium -Zinc  333-133-5 MG TABS, Take 1 tablet by mouth daily., Disp: , Rfl:    cetirizine (ZYRTEC) 10 MG tablet, Take 10 mg by mouth as needed for allergies., Disp: , Rfl:    diclofenac  Sodium (VOLTAREN ) 1 % GEL, Apply 2 g topically 4 (four) times daily., Disp: 60 g, Rfl: 1   dorzolamide -timolol  (COSOPT ) 2-0.5 % ophthalmic solution, INSTILL 1 DROP INTO LEFT EYE TWICE DAILY, Disp: 10 mL, Rfl: 3   ferrous sulfate  325 (65 FE) MG tablet, Take 1 tablet (325 mg total) by mouth 2 (two) times daily with a meal., Disp: 60 tablet, Rfl: 11   fluticasone  (FLONASE ) 50 MCG/ACT nasal spray, USE 1 SPRAY IN EACH NOSTRIL TWICE DAILY AS NEEDED FOR ALLERGIES, Disp: 48 g, Rfl: 3   levothyroxine  (SYNTHROID ) 75 MCG tablet, Take 1 tablet (75 mcg total) by mouth daily before breakfast., Disp: 90 tablet, Rfl: 1   loperamide  (IMODIUM  A-D) 2 MG tablet, Take 2 tablets (4 mg total) by mouth 4 (four) times daily as needed for diarrhea or loose stools., Disp: 30 tablet, Rfl: 0   Menthol-Methyl Salicylate  (SALONPAS  PAIN RELIEF  PATCH EX), Apply 1 application  topically daily as needed (pain)., Disp: , Rfl:    nitroGLYCERIN  (NITROSTAT ) 0.4 MG SL tablet, Place 1 tablet (0.4 mg total) under the tongue every 5 (five) minutes as needed for chest pain., Disp:  90 tablet, Rfl: 3   pantoprazole  (PROTONIX ) 40 MG tablet, Take 1 tablet (40 mg total) by mouth daily., Disp: 30 tablet, Rfl: 11   Polyethyl Glycol-Propyl Glycol (SYSTANE OP), Place 1 drop into both eyes daily as needed (dryness)., Disp: , Rfl:    rosuvastatin  (CRESTOR ) 20 MG tablet, TAKE 1 TABLET EVERY DAY, Disp: 90 tablet, Rfl: 3   vitamin B-12 (CYANOCOBALAMIN ) 100 MCG tablet, Take 100 mcg by mouth as needed., Disp: , Rfl:    ELIQUIS  2.5 MG TABS tablet, TAKE 1 TABLET TWICE DAILY (Patient not taking: Reported on 01/21/2024), Disp: 180 tablet, Rfl: 3   Ensure (ENSURE), Take 237 mLs by mouth daily. (Patient not  taking: Reported on 01/21/2024), Disp: , Rfl: :     Allergies  Allergen Reactions   Amoxicillin -Pot Clavulanate Diarrhea and Other (See Comments)    Drowsy     Brimonidine Other (See Comments)    Conj follicles and injection    Lisinopril Swelling and Other (See Comments)    Angioedema welling lips, throat closing     Brimonidine Tartrate-Timolol  Other (See Comments)    Redness in eye   Zolpidem Swelling    Lips swell    FH: Paternal grandparents had cancer.  SOCIAL HISTORY: She lives in [place].  She is retired from office work.  She quit smoking 3 years ago.  She drinks alcohol  on a daily basis.  Review of Systems: No fevers or sweats.  She has a good appetite.  No weight loss.  Energy level varies.  She denies bleeding.  She eats a regular diet.  She does not crave ice.  No tongue soreness.  Some nail cracking.  Stable exertional dyspnea.  No cough.  No change in bowel habits.  She notes stools are black since beginning oral iron 2 weeks ago.  No constipation or nausea with the iron.  No hematuria.  No dysuria.  No numbness or tingling in the hands or feet.  Physical Exam:  Blood pressure (!) 180/80, pulse 65, temperature 97.9 F (36.6 C), temperature source Temporal, resp. rate 18, height 5' 2 (1.575 m), weight 131 lb 3.2 oz (59.5 kg), SpO2 98%.  HEENT: No thrush or ulcers. Lungs: Distant breath sounds. Cardiac: Regular rate and rhythm. Abdomen: No hepatosplenomegaly. Vascular: No leg edema. Lymph nodes: No palpable cervical, supraclavicular, axillary or inguinal lymph nodes. Neurologic: Alert and oriented. Skin: Ecchymoses scattered over the forearms.  Brown discoloration lower anterior legs.   LABS:   Recent Labs    01/21/24 1128  WBC 6.9  HGB 9.6*  HCT 32.8*  PLT 239  Peripheral blood smear-polychromasia not increased, ovalocytes, microcytes, few teardrops, variation in red cell size; white cell morphology unremarkable; platelets normal  in number.  No results for input(s): NA, K, CL, CO2, GLUCOSE, BUN, CREATININE, CALCIUM  in the last 72 hours.    RADIOLOGY:  No results found.  Assessment and Plan:   Anemia Iron deficiency Atrial fibrillation, Eliquis  placed on hold 01/09/2024 Chronic kidney disease Hypertension Hypothyroid Alcohol  use History of thyroid  cancer  Ms. Wahlberg has iron deficiency anemia.  There has been improvement in the hemoglobin since beginning oral iron.  She will continue ferrous sulfate  twice daily.  A source of blood loss has not been identified.  We discussed the possibility of subclinical blood loss from the GI tract related to anticoagulation.  Eliquis  is currently on hold per cardiology.  She will complete stool cards and submit a urine specimen.  She has been referred  to gastroenterology.  We discussed the possibility of the anemia being multifactorial.  She also has chronic kidney disease.  Plan for additional evaluation if the hemoglobin does not normalize with correction of the iron deficiency.  She will return for lab and follow-up in 4 weeks.  She will contact the office in the interim with any problems.  Patient seen with Dr. Cloretta.    Olam Ned, NP 01/21/2024, 12:16 PM  This was a shared visit with Olam Ned.  Ms. Masin was interviewed and examined.  I reviewed the peripheral blood smear.  She is referred for evaluation of anemia.  The hematologic indices and serum iron studies were consistent with iron deficiency on 01/08/2024.  The anemia has improved with iron replacement.  The anemia could also be in part related to bleeding while on anticoagulation therapy, renal insufficiency, or myelodysplasia.  We will initiate additional diagnostic evaluation if the anemia does not correct with iron replacement.  She has been referred to gastroenterology to evaluate for source of blood loss.  I was present for greater than 50% of today's visit.  I performed medical  decision making.  Arvella Cloretta, MD

## 2024-01-21 ENCOUNTER — Encounter: Payer: Self-pay | Admitting: Nurse Practitioner

## 2024-01-21 ENCOUNTER — Inpatient Hospital Stay: Admitting: Nurse Practitioner

## 2024-01-21 ENCOUNTER — Telehealth: Payer: Self-pay | Admitting: Nurse Practitioner

## 2024-01-21 ENCOUNTER — Inpatient Hospital Stay: Attending: Nurse Practitioner

## 2024-01-21 VITALS — BP 180/80 | HR 65 | Temp 97.9°F | Resp 18 | Ht 62.0 in | Wt 131.2 lb

## 2024-01-21 DIAGNOSIS — Z85850 Personal history of malignant neoplasm of thyroid: Secondary | ICD-10-CM | POA: Insufficient documentation

## 2024-01-21 DIAGNOSIS — N189 Chronic kidney disease, unspecified: Secondary | ICD-10-CM | POA: Diagnosis not present

## 2024-01-21 DIAGNOSIS — Z7901 Long term (current) use of anticoagulants: Secondary | ICD-10-CM | POA: Insufficient documentation

## 2024-01-21 DIAGNOSIS — Z87891 Personal history of nicotine dependence: Secondary | ICD-10-CM

## 2024-01-21 DIAGNOSIS — I129 Hypertensive chronic kidney disease with stage 1 through stage 4 chronic kidney disease, or unspecified chronic kidney disease: Secondary | ICD-10-CM | POA: Diagnosis not present

## 2024-01-21 DIAGNOSIS — D509 Iron deficiency anemia, unspecified: Secondary | ICD-10-CM | POA: Diagnosis not present

## 2024-01-21 DIAGNOSIS — F1090 Alcohol use, unspecified, uncomplicated: Secondary | ICD-10-CM | POA: Diagnosis not present

## 2024-01-21 DIAGNOSIS — Z79899 Other long term (current) drug therapy: Secondary | ICD-10-CM | POA: Insufficient documentation

## 2024-01-21 DIAGNOSIS — I4891 Unspecified atrial fibrillation: Secondary | ICD-10-CM | POA: Insufficient documentation

## 2024-01-21 LAB — CBC WITH DIFFERENTIAL (CANCER CENTER ONLY)
Abs Immature Granulocytes: 0.04 K/uL (ref 0.00–0.07)
Basophils Absolute: 0 K/uL (ref 0.0–0.1)
Basophils Relative: 1 %
Eosinophils Absolute: 0.1 K/uL (ref 0.0–0.5)
Eosinophils Relative: 1 %
HCT: 32.8 % — ABNORMAL LOW (ref 36.0–46.0)
Hemoglobin: 9.6 g/dL — ABNORMAL LOW (ref 12.0–15.0)
Immature Granulocytes: 1 %
Lymphocytes Relative: 16 %
Lymphs Abs: 1.1 K/uL (ref 0.7–4.0)
MCH: 24.8 pg — ABNORMAL LOW (ref 26.0–34.0)
MCHC: 29.3 g/dL — ABNORMAL LOW (ref 30.0–36.0)
MCV: 84.8 fL (ref 80.0–100.0)
Monocytes Absolute: 0.5 K/uL (ref 0.1–1.0)
Monocytes Relative: 8 %
Neutro Abs: 5.2 K/uL (ref 1.7–7.7)
Neutrophils Relative %: 73 %
Platelet Count: 239 K/uL (ref 150–400)
RBC: 3.87 MIL/uL (ref 3.87–5.11)
RDW: 21.8 % — ABNORMAL HIGH (ref 11.5–15.5)
WBC Count: 6.9 K/uL (ref 4.0–10.5)
nRBC: 0 % (ref 0.0–0.2)

## 2024-01-21 LAB — URINALYSIS, COMPLETE (UACMP) WITH MICROSCOPIC
Bilirubin Urine: NEGATIVE
Glucose, UA: NEGATIVE mg/dL
Ketones, ur: NEGATIVE mg/dL
Nitrite: NEGATIVE
Protein, ur: 30 mg/dL — AB
Specific Gravity, Urine: 1.015 (ref 1.005–1.030)
WBC, UA: >50 WBC/hpf (ref 0–5)
pH: 5.5 (ref 5.0–8.0)

## 2024-01-21 LAB — VITAMIN B12: Vitamin B-12: 686 pg/mL (ref 180–914)

## 2024-01-21 LAB — SAVE SMEAR(SSMR), FOR PROVIDER SLIDE REVIEW

## 2024-01-21 NOTE — Telephone Encounter (Signed)
 Patient has been scheduled for follow-up visit per 01/21/24 LOS.  Pt aware of scheduled appt details.

## 2024-01-22 ENCOUNTER — Other Ambulatory Visit: Payer: Self-pay | Admitting: Nurse Practitioner

## 2024-01-22 ENCOUNTER — Other Ambulatory Visit: Payer: Self-pay | Admitting: *Deleted

## 2024-01-22 ENCOUNTER — Telehealth: Payer: Self-pay

## 2024-01-22 DIAGNOSIS — D509 Iron deficiency anemia, unspecified: Secondary | ICD-10-CM

## 2024-01-22 DIAGNOSIS — I129 Hypertensive chronic kidney disease with stage 1 through stage 4 chronic kidney disease, or unspecified chronic kidney disease: Secondary | ICD-10-CM | POA: Diagnosis not present

## 2024-01-22 DIAGNOSIS — Z87891 Personal history of nicotine dependence: Secondary | ICD-10-CM | POA: Diagnosis not present

## 2024-01-22 DIAGNOSIS — N390 Urinary tract infection, site not specified: Secondary | ICD-10-CM

## 2024-01-22 DIAGNOSIS — I4891 Unspecified atrial fibrillation: Secondary | ICD-10-CM | POA: Diagnosis not present

## 2024-01-22 DIAGNOSIS — Z79899 Other long term (current) drug therapy: Secondary | ICD-10-CM | POA: Diagnosis not present

## 2024-01-22 DIAGNOSIS — Z7901 Long term (current) use of anticoagulants: Secondary | ICD-10-CM | POA: Diagnosis not present

## 2024-01-22 DIAGNOSIS — Z85850 Personal history of malignant neoplasm of thyroid: Secondary | ICD-10-CM | POA: Diagnosis not present

## 2024-01-22 DIAGNOSIS — F1090 Alcohol use, unspecified, uncomplicated: Secondary | ICD-10-CM | POA: Diagnosis not present

## 2024-01-22 DIAGNOSIS — N189 Chronic kidney disease, unspecified: Secondary | ICD-10-CM | POA: Diagnosis not present

## 2024-01-22 LAB — CMP (CANCER CENTER ONLY)
ALT: 55 U/L — ABNORMAL HIGH (ref 0–44)
AST: 57 U/L — ABNORMAL HIGH (ref 15–41)
Albumin: 4.2 g/dL (ref 3.5–5.0)
Alkaline Phosphatase: 123 U/L (ref 38–126)
Anion gap: 13 (ref 5–15)
BUN: 33 mg/dL — ABNORMAL HIGH (ref 8–23)
CO2: 22 mmol/L (ref 22–32)
Calcium: 8.6 mg/dL — ABNORMAL LOW (ref 8.9–10.3)
Chloride: 105 mmol/L (ref 98–111)
Creatinine: 1.72 mg/dL — ABNORMAL HIGH (ref 0.44–1.00)
GFR, Estimated: 28 mL/min — ABNORMAL LOW (ref >60)
Glucose, Bld: 162 mg/dL — ABNORMAL HIGH (ref 70–99)
Potassium: 4.4 mmol/L (ref 3.5–5.1)
Sodium: 140 mmol/L (ref 135–145)
Total Bilirubin: 0.3 mg/dL (ref 0.0–1.2)
Total Protein: 6.8 g/dL (ref 6.5–8.1)

## 2024-01-22 MED ORDER — SULFAMETHOXAZOLE-TRIMETHOPRIM 800-160 MG PO TABS: 1.0000 | ORAL_TABLET | Freq: Once | ORAL | 0 refills | Status: AC

## 2024-01-22 MED ORDER — SULFAMETHOXAZOLE-TRIMETHOPRIM 400-80 MG PO TABS: 1.0000 | ORAL_TABLET | Freq: Two times a day (BID) | ORAL | 0 refills | Status: AC

## 2024-01-22 NOTE — Telephone Encounter (Signed)
 The patient has no known allergy to sulfa  drugs and is currently using [facility] located on [address].

## 2024-01-22 NOTE — Telephone Encounter (Signed)
-----   Message from Olam Ned sent at 01/22/2024  2:38 PM EDT ----- Please let her know the urine collected yesterday did not showed blood but she does appear to have an infection.  We are planning to prescribe Bactrim .  Please confirm she is not allergic to sulfa  drugs and let me know where she would like it sent.

## 2024-01-23 DIAGNOSIS — F1090 Alcohol use, unspecified, uncomplicated: Secondary | ICD-10-CM | POA: Diagnosis not present

## 2024-01-23 DIAGNOSIS — D509 Iron deficiency anemia, unspecified: Secondary | ICD-10-CM | POA: Diagnosis not present

## 2024-01-23 DIAGNOSIS — N189 Chronic kidney disease, unspecified: Secondary | ICD-10-CM | POA: Diagnosis not present

## 2024-01-23 DIAGNOSIS — I4891 Unspecified atrial fibrillation: Secondary | ICD-10-CM | POA: Diagnosis not present

## 2024-01-23 DIAGNOSIS — Z85850 Personal history of malignant neoplasm of thyroid: Secondary | ICD-10-CM | POA: Diagnosis not present

## 2024-01-23 DIAGNOSIS — I129 Hypertensive chronic kidney disease with stage 1 through stage 4 chronic kidney disease, or unspecified chronic kidney disease: Secondary | ICD-10-CM | POA: Diagnosis not present

## 2024-01-23 DIAGNOSIS — Z7901 Long term (current) use of anticoagulants: Secondary | ICD-10-CM | POA: Diagnosis not present

## 2024-01-23 DIAGNOSIS — Z79899 Other long term (current) drug therapy: Secondary | ICD-10-CM | POA: Diagnosis not present

## 2024-01-23 DIAGNOSIS — Z87891 Personal history of nicotine dependence: Secondary | ICD-10-CM | POA: Diagnosis not present

## 2024-01-23 LAB — URINE CULTURE: Culture: >=100000 — AB

## 2024-01-24 DIAGNOSIS — Z85850 Personal history of malignant neoplasm of thyroid: Secondary | ICD-10-CM | POA: Diagnosis not present

## 2024-01-24 DIAGNOSIS — I129 Hypertensive chronic kidney disease with stage 1 through stage 4 chronic kidney disease, or unspecified chronic kidney disease: Secondary | ICD-10-CM | POA: Diagnosis not present

## 2024-01-24 DIAGNOSIS — Z87891 Personal history of nicotine dependence: Secondary | ICD-10-CM | POA: Diagnosis not present

## 2024-01-24 DIAGNOSIS — I4891 Unspecified atrial fibrillation: Secondary | ICD-10-CM | POA: Diagnosis not present

## 2024-01-24 DIAGNOSIS — D509 Iron deficiency anemia, unspecified: Secondary | ICD-10-CM | POA: Diagnosis not present

## 2024-01-24 DIAGNOSIS — N189 Chronic kidney disease, unspecified: Secondary | ICD-10-CM | POA: Diagnosis not present

## 2024-01-24 DIAGNOSIS — F1090 Alcohol use, unspecified, uncomplicated: Secondary | ICD-10-CM | POA: Diagnosis not present

## 2024-01-24 DIAGNOSIS — Z79899 Other long term (current) drug therapy: Secondary | ICD-10-CM | POA: Diagnosis not present

## 2024-01-24 DIAGNOSIS — Z7901 Long term (current) use of anticoagulants: Secondary | ICD-10-CM | POA: Diagnosis not present

## 2024-01-26 ENCOUNTER — Inpatient Hospital Stay

## 2024-01-26 ENCOUNTER — Other Ambulatory Visit: Payer: Self-pay

## 2024-01-26 DIAGNOSIS — D509 Iron deficiency anemia, unspecified: Secondary | ICD-10-CM

## 2024-01-26 LAB — OCCULT BLOOD X 1 CARD TO LAB, STOOL
Fecal Occult Bld: NEGATIVE
Fecal Occult Bld: NEGATIVE
Fecal Occult Bld: NEGATIVE

## 2024-01-26 MED ORDER — PANTOPRAZOLE SODIUM 40 MG PO TBEC: 40.0000 mg | DELAYED_RELEASE_TABLET | Freq: Every day | ORAL | 11 refills | Status: AC

## 2024-02-20 ENCOUNTER — Other Ambulatory Visit: Payer: Self-pay

## 2024-02-20 ENCOUNTER — Encounter: Payer: Self-pay | Admitting: *Deleted

## 2024-02-20 ENCOUNTER — Encounter: Payer: Self-pay | Admitting: Nurse Practitioner

## 2024-02-20 ENCOUNTER — Inpatient Hospital Stay: Attending: Nurse Practitioner | Admitting: Nurse Practitioner

## 2024-02-20 ENCOUNTER — Telehealth: Payer: Self-pay | Admitting: Nurse Practitioner

## 2024-02-20 ENCOUNTER — Inpatient Hospital Stay

## 2024-02-20 VITALS — BP 161/72 | HR 62 | Temp 97.8°F | Resp 18

## 2024-02-20 DIAGNOSIS — E039 Hypothyroidism, unspecified: Secondary | ICD-10-CM | POA: Diagnosis not present

## 2024-02-20 DIAGNOSIS — N189 Chronic kidney disease, unspecified: Secondary | ICD-10-CM | POA: Diagnosis not present

## 2024-02-20 DIAGNOSIS — D509 Iron deficiency anemia, unspecified: Secondary | ICD-10-CM | POA: Diagnosis not present

## 2024-02-20 DIAGNOSIS — I4891 Unspecified atrial fibrillation: Secondary | ICD-10-CM | POA: Diagnosis not present

## 2024-02-20 DIAGNOSIS — I129 Hypertensive chronic kidney disease with stage 1 through stage 4 chronic kidney disease, or unspecified chronic kidney disease: Secondary | ICD-10-CM | POA: Insufficient documentation

## 2024-02-20 DIAGNOSIS — Z7901 Long term (current) use of anticoagulants: Secondary | ICD-10-CM | POA: Insufficient documentation

## 2024-02-20 DIAGNOSIS — Z85850 Personal history of malignant neoplasm of thyroid: Secondary | ICD-10-CM | POA: Diagnosis not present

## 2024-02-20 LAB — CBC WITH DIFFERENTIAL (CANCER CENTER ONLY)
Abs Immature Granulocytes: 0.01 K/uL (ref 0.00–0.07)
Basophils Absolute: 0.1 K/uL (ref 0.0–0.1)
Basophils Relative: 1 %
Eosinophils Absolute: 0.1 K/uL (ref 0.0–0.5)
Eosinophils Relative: 2 %
HCT: 37 % (ref 36.0–46.0)
Hemoglobin: 11.6 g/dL — ABNORMAL LOW (ref 12.0–15.0)
Immature Granulocytes: 0 %
Lymphocytes Relative: 19 %
Lymphs Abs: 1.2 K/uL (ref 0.7–4.0)
MCH: 27.8 pg (ref 26.0–34.0)
MCHC: 31.4 g/dL (ref 30.0–36.0)
MCV: 88.5 fL (ref 80.0–100.0)
Monocytes Absolute: 0.5 K/uL (ref 0.1–1.0)
Monocytes Relative: 7 %
Neutro Abs: 4.5 K/uL (ref 1.7–7.7)
Neutrophils Relative %: 71 %
Platelet Count: 189 K/uL (ref 150–400)
RBC: 4.18 MIL/uL (ref 3.87–5.11)
Smear Review: NORMAL
WBC Count: 6.3 K/uL (ref 4.0–10.5)
nRBC: 0 % (ref 0.0–0.2)

## 2024-02-20 LAB — FERRITIN: Ferritin: 51 ng/mL (ref 11–307)

## 2024-02-20 NOTE — Progress Notes (Signed)
 Referral faxed to [facility] at [phone]

## 2024-02-20 NOTE — Telephone Encounter (Signed)
 CONFIRMING TIME OF APPT

## 2024-02-20 NOTE — Progress Notes (Signed)
  [facility] OFFICE PROGRESS NOTE   Diagnosis: Iron deficiency anemia  INTERVAL HISTORY:   Ms. Miceli returns as scheduled.  She continues oral iron.  No nausea or constipation.  She notes stools are dark since beginning oral iron.  She denies bleeding.  Energy level is better.  Objective:  Vital signs in last 24 hours:  Blood pressure (!) 161/72, pulse 62, temperature 97.8 F (36.6 C), temperature source Temporal, resp. rate 18, SpO2 98%.     Resp: Lungs clear bilaterally. Cardio: Regular rate and rhythm. GI: No hepatosplenomegaly. Vascular: No leg edema.   Lab Results:  Lab Results  Component Value Date   WBC 6.3 02/20/2024   HGB 11.6 (L) 02/20/2024   HCT 37.0 02/20/2024   MCV 88.5 02/20/2024   PLT 189 02/20/2024   NEUTROABS PENDING 02/20/2024    Imaging:  No results found.  Medications: I have reviewed the patient's current medications.  Assessment/Plan: Anemia Iron deficiency 01/08/2024 ferritin 14, iron saturation 5, TIBC 493, iron 23  Stool cards negative x 11 January 2024 Atrial fibrillation, Eliquis  placed on hold 01/09/2024 Chronic kidney disease Hypertension Hypothyroid Alcohol  use History of thyroid  cancer  Disposition: Ms. Barsamian has iron deficiency anemia.  She continues oral iron.  The hemoglobin has partially corrected.  Ferritin is now in normal range.  She will continue twice daily iron.  She has not seen gastroenterology.  We will follow-up on the referral.  She will return for lab and follow-up in 6 weeks.  We are available to see her sooner if needed.  Olam Ned ANP/GNP-BC   02/20/2024  3:23 PM

## 2024-02-22 ENCOUNTER — Other Ambulatory Visit (HOSPITAL_BASED_OUTPATIENT_CLINIC_OR_DEPARTMENT_OTHER): Payer: Self-pay | Admitting: Cardiology

## 2024-02-23 ENCOUNTER — Other Ambulatory Visit: Payer: Self-pay | Admitting: Nurse Practitioner

## 2024-02-24 ENCOUNTER — Other Ambulatory Visit: Payer: Self-pay | Admitting: Nurse Practitioner

## 2024-02-25 MED ORDER — FERROUS SULFATE 325 (65 FE) MG PO TABS: 325.0000 mg | ORAL_TABLET | Freq: Two times a day (BID) | ORAL | 3 refills | Status: AC

## 2024-03-18 DIAGNOSIS — N184 Chronic kidney disease, stage 4 (severe): Secondary | ICD-10-CM | POA: Diagnosis not present

## 2024-03-23 DIAGNOSIS — N1832 Chronic kidney disease, stage 3b: Secondary | ICD-10-CM | POA: Diagnosis not present

## 2024-03-23 DIAGNOSIS — E559 Vitamin D deficiency, unspecified: Secondary | ICD-10-CM | POA: Diagnosis not present

## 2024-03-23 DIAGNOSIS — E1129 Type 2 diabetes mellitus with other diabetic kidney complication: Secondary | ICD-10-CM | POA: Diagnosis not present

## 2024-03-23 DIAGNOSIS — E1122 Type 2 diabetes mellitus with diabetic chronic kidney disease: Secondary | ICD-10-CM | POA: Diagnosis not present

## 2024-03-23 DIAGNOSIS — R809 Proteinuria, unspecified: Secondary | ICD-10-CM | POA: Diagnosis not present

## 2024-03-23 DIAGNOSIS — I129 Hypertensive chronic kidney disease with stage 1 through stage 4 chronic kidney disease, or unspecified chronic kidney disease: Secondary | ICD-10-CM | POA: Diagnosis not present

## 2024-04-02 ENCOUNTER — Inpatient Hospital Stay: Attending: Nurse Practitioner

## 2024-04-02 ENCOUNTER — Inpatient Hospital Stay: Admitting: Oncology

## 2024-04-02 ENCOUNTER — Ambulatory Visit: Payer: Self-pay | Admitting: Oncology

## 2024-04-02 VITALS — BP 174/70 | HR 73 | Temp 97.9°F | Resp 18 | Ht 62.0 in | Wt 135.8 lb

## 2024-04-02 DIAGNOSIS — F1090 Alcohol use, unspecified, uncomplicated: Secondary | ICD-10-CM | POA: Diagnosis not present

## 2024-04-02 DIAGNOSIS — I4891 Unspecified atrial fibrillation: Secondary | ICD-10-CM | POA: Insufficient documentation

## 2024-04-02 DIAGNOSIS — Z7901 Long term (current) use of anticoagulants: Secondary | ICD-10-CM | POA: Diagnosis not present

## 2024-04-02 DIAGNOSIS — E039 Hypothyroidism, unspecified: Secondary | ICD-10-CM | POA: Insufficient documentation

## 2024-04-02 DIAGNOSIS — D509 Iron deficiency anemia, unspecified: Secondary | ICD-10-CM | POA: Diagnosis not present

## 2024-04-02 DIAGNOSIS — Z85850 Personal history of malignant neoplasm of thyroid: Secondary | ICD-10-CM | POA: Insufficient documentation

## 2024-04-02 DIAGNOSIS — I129 Hypertensive chronic kidney disease with stage 1 through stage 4 chronic kidney disease, or unspecified chronic kidney disease: Secondary | ICD-10-CM | POA: Insufficient documentation

## 2024-04-02 DIAGNOSIS — N189 Chronic kidney disease, unspecified: Secondary | ICD-10-CM | POA: Diagnosis not present

## 2024-04-02 LAB — CBC WITH DIFFERENTIAL (CANCER CENTER ONLY)
Abs Immature Granulocytes: 0.01 K/uL (ref 0.00–0.07)
Basophils Absolute: 0 K/uL (ref 0.0–0.1)
Basophils Relative: 1 %
Eosinophils Absolute: 0.1 K/uL (ref 0.0–0.5)
Eosinophils Relative: 2 %
HCT: 38.8 % (ref 36.0–46.0)
Hemoglobin: 12.2 g/dL (ref 12.0–15.0)
Immature Granulocytes: 0 %
Lymphocytes Relative: 22 %
Lymphs Abs: 1.3 K/uL (ref 0.7–4.0)
MCH: 29.4 pg (ref 26.0–34.0)
MCHC: 31.4 g/dL (ref 30.0–36.0)
MCV: 93.5 fL (ref 80.0–100.0)
Monocytes Absolute: 0.5 K/uL (ref 0.1–1.0)
Monocytes Relative: 8 %
Neutro Abs: 4 K/uL (ref 1.7–7.7)
Neutrophils Relative %: 67 %
Platelet Count: 183 K/uL (ref 150–400)
RBC: 4.15 MIL/uL (ref 3.87–5.11)
RDW: 20.9 % — ABNORMAL HIGH (ref 11.5–15.5)
WBC Count: 5.9 K/uL (ref 4.0–10.5)
nRBC: 0 % (ref 0.0–0.2)

## 2024-04-02 LAB — FERRITIN: Ferritin: 64 ng/mL (ref 11–307)

## 2024-04-02 LAB — URINALYSIS, COMPLETE (UACMP) WITH MICROSCOPIC
Bilirubin Urine: NEGATIVE
Glucose, UA: NEGATIVE mg/dL
Hgb urine dipstick: NEGATIVE
Ketones, ur: NEGATIVE mg/dL
Nitrite: NEGATIVE
Protein, ur: 30 mg/dL — AB
Specific Gravity, Urine: 1.022 (ref 1.005–1.030)
pH: 5.5 (ref 5.0–8.0)

## 2024-04-02 NOTE — Telephone Encounter (Signed)
 Informed patient via VM that urine was negative for blood and ferritin was in normal range.

## 2024-04-02 NOTE — Progress Notes (Signed)
  [facility] OFFICE PROGRESS NOTE   Diagnosis: Iron deficiency anemia  INTERVAL HISTORY:   Cheryl Mora returns as scheduled.  She is taking iron.  No bleeding.  No complaint.  No dysuria.  She did not follow-up with GI.  She reports being contacted by the GI service.  Objective:  Vital signs in last 24 hours:  Blood pressure (!) 174/70, pulse 73, temperature 97.9 F (36.6 C), temperature source Temporal, resp. rate 18, height 5' 2 (1.575 m), weight 135 lb 12.8 oz (61.6 kg), SpO2 94%.     Lymphatics: No cervical or supraclavicular nodes Resp: Lungs clear bilaterally Cardio: Regular rate and rhythm GI: No hepatosplenomegaly, nontender Vascular: No leg edema   Lab Results:  Lab Results  Component Value Date   WBC 5.9 04/02/2024   HGB 12.2 04/02/2024   HCT 38.8 04/02/2024   MCV 93.5 04/02/2024   PLT 183 04/02/2024   NEUTROABS 4.0 04/02/2024    CMP  Lab Results  Component Value Date   NA 140 01/22/2024   K 4.4 01/22/2024   CL 105 01/22/2024   CO2 22 01/22/2024   GLUCOSE 162 (H) 01/22/2024   BUN 33 (H) 01/22/2024   CREATININE 1.72 (H) 01/22/2024   CALCIUM  8.6 (L) 01/22/2024   PROT 6.8 01/22/2024   ALBUMIN 4.2 01/22/2024   AST 57 (H) 01/22/2024   ALT 55 (H) 01/22/2024   ALKPHOS 123 01/22/2024   BILITOT 0.3 01/22/2024   GFRNONAA 28 (L) 01/22/2024   GFRAA 50 (L) 04/06/2020    No results found for: CEA1, CEA, CAN199, CA125  Lab Results  Component Value Date   INR 1.2 11/06/2022   LABPROT 15.0 11/06/2022    Imaging:  No results found.  Medications: I have reviewed the patient's current medications.   Assessment/Plan: Anemia Iron deficiency 01/08/2024 ferritin 14, iron saturation 5, TIBC 493, iron 23  Stool cards negative x 11 January 2024 Atrial fibrillation, Eliquis  placed on hold 01/09/2024 Chronic kidney disease Hypertension Hypothyroid Alcohol  use History of thyroid  cancer    Disposition: Cheryl Mora has a history of  iron deficiency anemia.  The iron deficiency is most likely related to subclinical blood loss while on apixaban .  Apixaban  was discontinued in August.  The iron deficiency has corrected.  She will continue oral iron therapy.  She is not been evaluated by gastroenterology.  She says she will consider contacting the GI service for an appointment.  We checked a urinalysis today to look for evidence of subclinical GU blood loss.  She appeared to have an infection on her urinalysis in August.  Cheryl Mora will return for an office visit in 6 months.  Arley Hof, MD  04/02/2024  10:01 AM

## 2024-04-04 ENCOUNTER — Other Ambulatory Visit: Payer: Self-pay | Admitting: Internal Medicine

## 2024-05-10 DIAGNOSIS — Z1231 Encounter for screening mammogram for malignant neoplasm of breast: Secondary | ICD-10-CM | POA: Diagnosis not present

## 2024-05-18 ENCOUNTER — Encounter (HOSPITAL_BASED_OUTPATIENT_CLINIC_OR_DEPARTMENT_OTHER): Payer: Self-pay | Admitting: Cardiology

## 2024-05-18 ENCOUNTER — Ambulatory Visit (HOSPITAL_BASED_OUTPATIENT_CLINIC_OR_DEPARTMENT_OTHER): Admitting: Cardiology

## 2024-05-18 VITALS — BP 184/92 | HR 62 | Ht 62.0 in | Wt 137.1 lb

## 2024-05-18 DIAGNOSIS — Z5181 Encounter for therapeutic drug level monitoring: Secondary | ICD-10-CM | POA: Diagnosis not present

## 2024-05-18 DIAGNOSIS — I2510 Atherosclerotic heart disease of native coronary artery without angina pectoris: Secondary | ICD-10-CM

## 2024-05-18 DIAGNOSIS — E7800 Pure hypercholesterolemia, unspecified: Secondary | ICD-10-CM | POA: Diagnosis not present

## 2024-05-18 DIAGNOSIS — D6869 Other thrombophilia: Secondary | ICD-10-CM | POA: Diagnosis not present

## 2024-05-18 DIAGNOSIS — Z79899 Other long term (current) drug therapy: Secondary | ICD-10-CM | POA: Diagnosis not present

## 2024-05-18 DIAGNOSIS — I428 Other cardiomyopathies: Secondary | ICD-10-CM | POA: Diagnosis not present

## 2024-05-18 DIAGNOSIS — I10 Essential (primary) hypertension: Secondary | ICD-10-CM | POA: Diagnosis not present

## 2024-05-18 DIAGNOSIS — I480 Paroxysmal atrial fibrillation: Secondary | ICD-10-CM | POA: Diagnosis not present

## 2024-05-18 DIAGNOSIS — D509 Iron deficiency anemia, unspecified: Secondary | ICD-10-CM

## 2024-05-18 MED ORDER — AMLODIPINE BESYLATE 5 MG PO TABS: 5.0000 mg | ORAL_TABLET | Freq: Every day | ORAL | 3 refills | Status: AC

## 2024-05-18 NOTE — Patient Instructions (Signed)
 Medication Instructions:  Your physician has recommended you make the following change in your medication:  1.) start amlodipine  5 mg - one tablet daily  *If you need a refill on your cardiac medications before your next appointment, please call your pharmacy*  Lab Work: Today: CMET, lipids, TSH  Testing/Procedures: none  Follow-Up: At [facility], you and your health needs are our priority.  As part of our continuing mission to provide you with exceptional heart care, our providers are all part of one team.  This team includes your primary Cardiologist (physician) and Advanced Practice Providers or APPs (Physician Assistants and Nurse Practitioners) who all work together to provide you with the care you need, when you need it.  Your next appointment:   3 month(s)  Provider:   Shelda Bruckner, MD, Rosaline Bane, NP, or Reche Finder, NP

## 2024-05-18 NOTE — Progress Notes (Signed)
 Cardiology Office Note:  .   Date:  05/18/2024  ID:  Cheryl Mora, DOB 03/09/1933, MRN [id number] PCP: Caleen Dirks, MD  [facility] Providers Cardiologist:  Shelda Bruckner, MD {  History of Present Illness: Cheryl Mora is a 89 y.o. female with a hx of type II diabetes, hyperlipidemia, hypertension, hypothyroidism, paroxysmal atrial fibrillation who is seen for follow up.    Cardiac history: I met her 03/02/20 as a new consult for DOE. Very concerning story, set up for outpatient cath. Cath 9/27 with NICM, reduced EF, and new afib RVR. Her medications were adjusted, and she spontaneously converted to sinus rhythm prior to discharge 03/07/20. Since then, she has returned to atrial fibrillation despite amiodarone  and reattempt at cardioversion.  Today: I have not seen her since 2023; she has been followed by Reche Finder and Rosaline Bane in the interim. She is overall doing well. Using a cane for balance. Has bilateral hearing aids now. No falls.   Blood pressure has been historically labile, but recently has been more persistently elevated. Has been around 150/70 on average at home. Has not had recent lows. At office visits, was 160 systolic in September, 170 systolic in October, and 180s today. She is currently not on any hypertensive medications. She has been on and off amlodipine  in the past.  Has been off of apixaban  for iron deficiency anemia since August 2025. Has not followed up with GI per patient preference. Does not want to consider restarting anticoagulation. Discussed in depth, see below. Hgb 8.5 01/08/24, most recent 04/02/24 Hgb 12.2.   ROS: Denies chest pain, shortness of breath at rest or with normal exertion. No PND, orthopnea, LE edema or unexpected weight gain. No syncope or palpitations. ROS otherwise negative except as noted.   Studies Reviewed: SABRA    EKG:       Physical Exam:   VS:  BP (!) 184/92   Pulse 62   Ht 5' 2 (1.575 m)   Wt 137 lb  1.6 oz (62.2 kg)   SpO2 98%   BMI 25.08 kg/m    Wt Readings from Last 3 Encounters:  05/18/24 137 lb 1.6 oz (62.2 kg)  04/02/24 135 lb 12.8 oz (61.6 kg)  01/21/24 131 lb 3.2 oz (59.5 kg)    GEN: Well nourished, well developed in no acute distress HEENT: Normal, moist mucous membranes NECK: No JVD CARDIAC: regular rhythm, normal S1 and S2, no rubs or gallops. No murmur. VASCULAR: Radial and DP pulses 2+ bilaterally. No carotid bruits RESPIRATORY:  Clear to auscultation without rales, wheezing or rhonchi  ABDOMEN: Soft, non-tender, non-distended MUSCULOSKELETAL:  Ambulates independently SKIN: Warm and dry, no edema NEUROLOGIC:  Alert and oriented x 3. No focal neuro deficits noted. PSYCHIATRIC:  Normal affect    ASSESSMENT AND PLAN: .    History of labile hypertension/hypotension -has been running consistently high, at home and at visits (160s->170s->180 over the last months) -has been off amlodipine  for months, currently on no medications -we discussed options. Will restart amlodipine  5 mg daily. Will watch for lows -she is getting a new PCP in-house at [facility] starting next week. She plans to follow closely with them, but I am also happy to titrate if needed -counseled on red flag warning signs that need immediate medical attention   Atrial fibrillation, paroxysmal -in sinus rhythm, continue amiodarone , reduced dose given prior abnormal LFTs. Due to check TSH and LFTs, establishing with new PCP, will give labs slips  just in case but if this is checked through PCP this is fine.  -CHA2DS2/VAS Stroke Risk Points=  6. -apixaban  was stopped August 2025 for iron deficiency anemia, suspected slow GI blood loss. We discussed that she is at increased risk of stroke and is currently not protected against this. We discussed GI evaluation (she does not want procedures) or restarting DOAC. She does not want the bruising she had with anticoagulation and does not wish to restart. We discussed  that aspirin  does not protect against stroke from afib -we discussed Watchman today. She is not interested in pursuing at this time -she and her daughter will continue to discuss options   Nonischemic cardiomyopathy, with recovered EF -tried jardiance , felt terrible. Does not want reattempt this or similar medications -did not tolerate losartan  or metoprolol  well previously. Labile BP, as above   Nonobstructive CAD Hypercholesterolemia Carotid artery disease -goal LDL <70, due for lipids, ordered today (not fasting but had oatmeal several hours ago) -continue rosuvastatin    Type II diabetes: -did not tolerate Jardiance , made her feel terrible as above  Dispo: 3 mos  Total time of encounter: I spent 41 minutes dedicated to the care of this patient on the date of this encounter to include pre-visit review of records, face-to-face time with the patient discussing conditions above, and clinical documentation with the electronic health record. We specifically spent time today discussing anticoagulation, atrial fibrillation, risk of stroke vs. Risk of bleeding, Watchman, blood pressure elevation, options for medications   Signed, Shelda Bruckner, MD   Shelda Bruckner, MD, PhD, [facility]  [facility]  [place]  [facility] at [facility] at [facility] [address] [place], [place] [address] [phone]

## 2024-05-22 LAB — COMPREHENSIVE METABOLIC PANEL WITH GFR
ALT: 26 IU/L (ref 0–32)
AST: 28 IU/L (ref 0–40)
Albumin: 4.3 g/dL (ref 3.7–4.7)
Alkaline Phosphatase: 101 IU/L (ref 48–129)
BUN/Creatinine Ratio: 18 (ref 12–28)
BUN: 23 mg/dL (ref 8–27)
Bilirubin Total: 0.7 mg/dL (ref 0.0–1.2)
CO2: 20 mmol/L (ref 20–29)
Calcium: 8.7 mg/dL (ref 8.7–10.3)
Chloride: 104 mmol/L (ref 96–106)
Creatinine, Ser: 1.28 mg/dL — ABNORMAL HIGH (ref 0.57–1.00)
Globulin, Total: 2.7 g/dL (ref 1.5–4.5)
Glucose: 116 mg/dL — ABNORMAL HIGH (ref 70–99)
Potassium: 4.4 mmol/L (ref 3.5–5.2)
Sodium: 142 mmol/L (ref 134–144)
Total Protein: 7 g/dL (ref 6.0–8.5)
eGFR: 40 mL/min/1.73 — ABNORMAL LOW (ref >59)

## 2024-05-22 LAB — TSH: TSH: 12 u[IU]/mL — ABNORMAL HIGH (ref 0.450–4.500)

## 2024-05-22 LAB — LIPID PANEL
Chol/HDL Ratio: 2 ratio (ref 0.0–4.4)
Cholesterol, Total: 129 mg/dL (ref 100–199)
HDL: 65 mg/dL (ref >39)
LDL Chol Calc (NIH): 48 mg/dL (ref 0–99)
Triglycerides: 86 mg/dL (ref 0–149)
VLDL Cholesterol Cal: 16 mg/dL (ref 5–40)

## 2024-05-23 ENCOUNTER — Other Ambulatory Visit: Payer: Self-pay | Admitting: Internal Medicine

## 2024-06-28 ENCOUNTER — Ambulatory Visit (HOSPITAL_BASED_OUTPATIENT_CLINIC_OR_DEPARTMENT_OTHER): Payer: Self-pay | Admitting: Cardiology

## 2024-06-28 DIAGNOSIS — R7989 Other specified abnormal findings of blood chemistry: Secondary | ICD-10-CM

## 2024-07-01 NOTE — Telephone Encounter (Signed)
 Called patient and reviewed.  Cheryl Mora is getting a new medical service and the doctor was at her apartment yesterday and picked up her records but she has no appointment set.   She said if she gets sick she is to let the [employer] nurse know and they will contact the doctor there. The name is [employer].  I have placed the addition thyroid  lab orders.  She will come in either late next week or the week after when the weather/storm has cleared. I placed the lab slips in outgoing mail to her as a reminder.

## 2024-08-20 ENCOUNTER — Ambulatory Visit (HOSPITAL_BASED_OUTPATIENT_CLINIC_OR_DEPARTMENT_OTHER): Admitting: Cardiology

## 2024-10-01 ENCOUNTER — Inpatient Hospital Stay: Admitting: Nurse Practitioner

## 2024-10-01 ENCOUNTER — Inpatient Hospital Stay
